# Patient Record
Sex: Female | Born: 1990 | Race: White | Hispanic: No | Marital: Single | Smoking: Never smoker
Health system: Southern US, Community
[De-identification: ages and names within clinical notes are randomized; demographics above are authoritative.]

## PROBLEM LIST (undated history)

## (undated) DIAGNOSIS — K859 Acute pancreatitis without necrosis or infection, unspecified: Secondary | ICD-10-CM

## (undated) DIAGNOSIS — N946 Dysmenorrhea, unspecified: Secondary | ICD-10-CM

## (undated) DIAGNOSIS — F419 Anxiety disorder, unspecified: Secondary | ICD-10-CM

## (undated) DIAGNOSIS — Z3040 Encounter for surveillance of contraceptives, unspecified: Secondary | ICD-10-CM

## (undated) DIAGNOSIS — O24419 Gestational diabetes mellitus in pregnancy, unspecified control: Secondary | ICD-10-CM

## (undated) HISTORY — PX: ADENOIDECTOMY: SUR15

## (undated) HISTORY — PX: WISDOM TOOTH EXTRACTION: SHX21

## (undated) HISTORY — PX: CHOLECYSTECTOMY: SHX55

## (undated) HISTORY — PX: TONSILLECTOMY: SUR1361

---
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
## 2011-01-29 LAB — POCT HEMOGLOBIN-HEMACUE: Hemoglobin: 14.7

## 2011-06-27 LAB — HEPATIC FUNCTION PANEL
ALK PHOS: 39 U/L (ref 25–125)
ALT: 27 U/L (ref 7–35)
AST: 22 U/L (ref 13–35)
Bilirubin, Total: 0.3 mg/dL

## 2011-06-27 LAB — CBC AND DIFFERENTIAL
HCT: 41 % (ref 36–46)
HEMOGLOBIN: 14.1 g/dL (ref 12.0–16.0)
Platelets: 309 10*3/uL (ref 150–399)
WBC: 7.2 10^3/mL

## 2011-06-27 LAB — TSH: TSH: 3.18 u[IU]/mL (ref 0.41–5.90)

## 2011-06-27 LAB — BASIC METABOLIC PANEL
BUN: 12 mg/dL (ref 4–21)
Creatinine: 0.8 mg/dL (ref 0.5–1.1)
GLUCOSE: 120 mg/dL
POTASSIUM: 3.8 mmol/L (ref 3.4–5.3)
SODIUM: 141 mmol/L (ref 137–147)

## 2011-06-27 LAB — HEMOGLOBIN A1C: Hemoglobin A1C: 5.4

## 2011-08-26 ENCOUNTER — Encounter (HOSPITAL_BASED_OUTPATIENT_CLINIC_OR_DEPARTMENT_OTHER): Payer: Self-pay | Admitting: Emergency Medicine

## 2011-08-26 ENCOUNTER — Emergency Department (HOSPITAL_BASED_OUTPATIENT_CLINIC_OR_DEPARTMENT_OTHER)
Admission: EM | Admit: 2011-08-26 | Discharge: 2011-08-26 | Disposition: A | Discharge status: Home / Self Care | Payer: Managed Care, Other (non HMO) | Attending: Emergency Medicine | Admitting: Emergency Medicine

## 2011-08-26 DIAGNOSIS — IMO0002 Reserved for concepts with insufficient information to code with codable children: Secondary | ICD-10-CM | POA: Insufficient documentation

## 2011-08-26 DIAGNOSIS — S0181XA Laceration without foreign body of other part of head, initial encounter: Secondary | ICD-10-CM

## 2011-08-26 DIAGNOSIS — S0180XA Unspecified open wound of other part of head, initial encounter: Secondary | ICD-10-CM | POA: Insufficient documentation

## 2011-08-26 DIAGNOSIS — R51 Headache: Secondary | ICD-10-CM | POA: Insufficient documentation

## 2011-08-26 DIAGNOSIS — Z79899 Other long term (current) drug therapy: Secondary | ICD-10-CM | POA: Insufficient documentation

## 2011-08-26 MED ORDER — LIDOCAINE-EPINEPHRINE-TETRACAINE (LET) SOLUTION
3.0000 mL | Freq: Once | NASAL | Status: AC
  Administered 2011-08-26: 3 mL via TOPICAL
  Filled 2011-08-26: qty 3

## 2011-08-26 NOTE — ED Notes (Signed)
Pt c/o lac to forehead s/p tripping & hitting head on corner of counter; denies LOC

## 2011-08-26 NOTE — ED Provider Notes (Signed)
History     CSN: [id number]  Arrival date & time 08/26/11  1819   First MD Initiated Contact with Patient 08/26/11 1933      Chief Complaint  Patient presents with  . Head Injury  . Head Laceration    (Consider location/radiation/quality/duration/timing/severity/associated sxs/prior treatment) Patient is a 21 y.o. female presenting with head injury and scalp laceration. The history is provided by the patient. No language interpreter was used.  Head Injury  The incident occurred less than 1 hour ago. She came to the ER via walk-in. The injury mechanism was a direct blow. There was no loss of consciousness. The volume of blood lost was minimal. The pain is at a severity of 3/10. The pain is mild. The pain has been constant since the injury. She has tried nothing for the symptoms. The treatment provided no relief.  Head Laceration    History reviewed. No pertinent past medical history.  Past Surgical History  Procedure Date  . Knee surgery   . Tonsillectomy   . Adenoidectomy     No family history on file.  History  Substance Use Topics  . Smoking status: Never Smoker   . Smokeless tobacco: Not on file  . Alcohol Use: No    OB History    Grav Para Term Preterm Abortions TAB SAB Ect Mult Living                  Review of Systems  Skin: Positive for wound.  All other systems reviewed and are negative.    Allergies  Review of patient's allergies indicates no known allergies.  Home Medications   Current Outpatient Rx  Name Route Sig Dispense Refill  . CITALOPRAM HYDROBROMIDE 20 MG PO TABS Oral Take 20 mg by mouth daily.    Marland Kitchen VITAMIN B 12 PO Oral Take 1 tablet by mouth daily.    Marland Kitchen FERROUS SULFATE 325 (65 FE) MG PO TABS Oral Take 325 mg by mouth daily.    . IBUPROFEN 200 MG PO TABS Oral Take by mouth every 6 (six) hours as needed. For pain    . NORETHIN ACE-ETH ESTRAD-FE 1-20 MG-MCG PO TABS Oral Take 1 tablet by mouth daily.      BP 136/80  Pulse 83   Temp(Src) 98.1 F (36.7 C) (Oral)  Resp 16  Ht 5\' 1"  (1.549 m)  Wt 160 lb (72.576 kg)  BMI 30.23 kg/m2  SpO2 100%  LMP 07/29/2011  Physical Exam  Constitutional: She is oriented to person, place, and time. She appears well-developed and well-nourished.  HENT:  Head: Normocephalic and atraumatic.  Right Ear: External ear normal.  Left Ear: External ear normal.  Nose: Nose normal.  Mouth/Throat: Oropharynx is clear and moist.  Eyes: Conjunctivae and EOM are normal. Pupils are equal, round, and reactive to light.  Neck: Normal range of motion. Neck supple.  Cardiovascular: Normal rate.   Pulmonary/Chest: Effort normal.  Abdominal: Soft.  Musculoskeletal: Normal range of motion.  Neurological: She is alert and oriented to person, place, and time.  Skin:       1.6 cm laceration forehead  Psychiatric: She has a normal mood and affect.    ED Course  LACERATION REPAIR Date/Time: 08/26/2011 8:44 PM Performed by: Elson Areas Authorized by: Elson Areas Consent: Verbal consent not obtained. Consent given by: patient Patient understanding: patient states understanding of the procedure being performed Patient identity confirmed: verbally with patient Time out: Immediately prior to procedure a "time out"  was called to verify the correct patient, procedure, equipment, support staff and site/side marked as required. Body area: head/neck Laceration length: 1.8 cm Foreign bodies: no foreign bodies Vascular damage: no Anesthesia: local infiltration Irrigation solution: saline Amount of cleaning: standard Skin closure: 6-0 Prolene Number of sutures: 4 Technique: simple Approximation difficulty: simple Patient tolerance: Patient tolerated the procedure well with no immediate complications.   (including critical care time)  Labs Reviewed - No data to display No results found.   No diagnosis found.    MDM  Suture removal in 6 days       Lonia Skinner [place],  [place] 08/26/11 2046

## 2011-08-26 NOTE — Discharge Instructions (Signed)
Facial Laceration  A facial laceration is a cut on the face. Lacerations usually heal quickly, but they need special care to reduce scarring. It will take 1 to 2 years for the scar to lose its redness and to heal completely.  TREATMENT   Some facial lacerations may not require closure. Some lacerations may not be able to be closed due to an increased risk of infection. It is important to see your caregiver as soon as possible after an injury to minimize the risk of infection and to maximize the opportunity for successful closure.  If closure is appropriate, pain medicines may be given, if needed. The wound will be cleaned to help prevent infection. Your caregiver will use stitches (sutures), staples, wound glue (adhesive), or skin adhesive strips to repair the laceration. These tools bring the skin edges together to allow for faster healing and a better cosmetic outcome. However, all wounds will heal with a scar.   Once the wound has healed, scarring can be minimized by covering the wound with sunscreen during the day for 1 full year. Use a sunscreen with an SPF of at least 30. Sunscreen helps to reduce the pigment that will form in the scar. When applying sunscreen to a completely healed wound, massage the scar for a few minutes to help reduce the appearance of the scar. Use circular motions with your fingertips, on and around the scar. Do not massage a healing wound.  HOME CARE INSTRUCTIONS  For sutures:   Keep the wound clean and dry.   If you were given a bandage (dressing), you should change it at least once a day. Also change the dressing if it becomes wet or dirty, or as directed by your caregiver.   Wash the wound with soap and water 2 times a day. Rinse the wound off with water to remove all soap. Pat the wound dry with a clean towel.   After cleaning, apply a thin layer of the antibiotic ointment recommended by your caregiver. This will help prevent infection and keep the dressing from sticking.   You  may shower as usual after the first 24 hours. Do not soak the wound in water until the sutures are removed.   Only take over-the-counter or prescription medicines for pain, discomfort, or fever as directed by your caregiver.   Get your sutures removed as directed by your caregiver. With facial lacerations, sutures should usually be taken out after 4 to 5 days to avoid stitch marks.   Wait a few days after your sutures are removed before applying makeup.  For skin adhesive strips:   Keep the wound clean and dry.   Do not get the skin adhesive strips wet. You may bathe carefully, using caution to keep the wound dry.   If the wound gets wet, pat it dry with a clean towel.   Skin adhesive strips will fall off on their own. You may trim the strips as the wound heals. Do not remove skin adhesive strips that are still stuck to the wound. They will fall off in time.  For wound adhesive:   You may briefly wet your wound in the shower or bath. Do not soak or scrub the wound. Do not swim. Avoid periods of heavy perspiration until the skin adhesive has fallen off on its own. After showering or bathing, gently pat the wound dry with a clean towel.   Do not apply liquid medicine, cream medicine, ointment medicine, or makeup to your wound while the   skin adhesive is in place. This may loosen the film before your wound is healed.   If a dressing is placed over the wound, be careful not to apply tape directly over the skin adhesive. This may cause the adhesive to be pulled off before the wound is healed.   Avoid prolonged exposure to sunlight or tanning lamps while the skin adhesive is in place. Exposure to ultraviolet light in the first year will darken the scar.   The skin adhesive will usually remain in place for 5 to 10 days, then naturally fall off the skin. Do not pick at the adhesive film.  You may need a tetanus shot if:   You cannot remember when you had your last tetanus shot.   You have never had a tetanus  shot.  If you get a tetanus shot, your arm may swell, get red, and feel warm to the touch. This is common and not a problem. If you need a tetanus shot and you choose not to have one, there is a rare chance of getting tetanus. Sickness from tetanus can be serious.  SEEK IMMEDIATE MEDICAL CARE IF:   You develop redness, pain, or swelling around the wound.   There is yellowish-white fluid (pus) coming from the wound.   You develop chills or a fever.  MAKE SURE YOU:   Understand these instructions.   Will watch your condition.   Will get help right away if you are not doing well or get worse.  Document Released: 05/22/2004 Document Revised: 04/03/2011 Document Reviewed: 10/07/2010  ExitCare Patient Information 2012 [employer].

## 2011-08-27 NOTE — ED Provider Notes (Signed)
Medical screening examination/treatment/procedure(s) were conducted as a shared visit with non-physician practitioner(s) and myself.  I personally evaluated the patient during the encounter  Pt seen by me initially.  PAC Sofia performed wound repair under my supervision.  No LOC, no bleeding, laceration to mid forehead in a U shape, simple.  Please see procedure note for full details.    Gavin Pound. Mersedes Alber, MD 08/27/11 0002

## 2012-01-09 LAB — CBC AND DIFFERENTIAL
HCT: 41 % (ref 36–46)
HEMOGLOBIN: 13.7 g/dL (ref 12.0–16.0)
Platelets: 299 10*3/uL (ref 150–399)
WBC: 7.2 10*3/mL

## 2012-03-04 LAB — LIPID PANEL
Cholesterol: 136 mg/dL (ref 0–200)
HDL: 63 mg/dL (ref 35–70)
LDL Cholesterol: 64 mg/dL
Triglycerides: 43 mg/dL (ref 40–160)

## 2012-03-04 LAB — HEPATIC FUNCTION PANEL
ALT: 19 U/L (ref 7–35)
AST: 16 U/L (ref 13–35)
Alkaline Phosphatase: 43 U/L (ref 25–125)
Bilirubin, Total: 0.2 mg/dL

## 2012-03-04 LAB — BASIC METABOLIC PANEL
BUN: 14 mg/dL (ref 4–21)
Creatinine: 0.8 mg/dL (ref 0.5–1.1)
GLUCOSE: 84 mg/dL
POTASSIUM: 4.2 mmol/L (ref 3.4–5.3)
Sodium: 139 mmol/L (ref 137–147)

## 2013-07-12 LAB — TSH: TSH: 2.87 u[IU]/mL (ref 0.41–5.90)

## 2013-07-12 LAB — BASIC METABOLIC PANEL
BUN: 16 mg/dL (ref 4–21)
CREATININE: 1 mg/dL (ref 0.5–1.1)
Glucose: 80 mg/dL
Potassium: 4 mmol/L (ref 3.4–5.3)
SODIUM: 139 mmol/L (ref 137–147)

## 2013-07-12 LAB — HEPATIC FUNCTION PANEL
ALK PHOS: 43 U/L (ref 25–125)
ALT: 14 U/L (ref 7–35)
AST: 17 U/L (ref 13–35)
BILIRUBIN, TOTAL: 0.6 mg/dL

## 2013-07-12 LAB — LIPID PANEL
CHOLESTEROL: 102 mg/dL (ref 0–200)
HDL: 58 mg/dL (ref 35–70)
LDL CALC: 38 mg/dL
TRIGLYCERIDES: 29 mg/dL — AB (ref 40–160)

## 2013-07-12 LAB — CBC AND DIFFERENTIAL
HEMATOCRIT: 41 % (ref 36–46)
Hemoglobin: 13.7 g/dL (ref 12.0–16.0)
Platelets: 283 10*3/uL (ref 150–399)
WBC: 7.4 10^3/mL

## 2013-12-22 LAB — BASIC METABOLIC PANEL
BUN: 14 mg/dL (ref 4–21)
CREATININE: 0.8 mg/dL (ref 0.5–1.1)
Glucose: 75 mg/dL
POTASSIUM: 3.7 mmol/L (ref 3.4–5.3)
Sodium: 140 mmol/L (ref 137–147)

## 2013-12-22 LAB — HEPATIC FUNCTION PANEL
ALT: 74 U/L — AB (ref 7–35)
AST: 35 U/L (ref 13–35)
Alkaline Phosphatase: 52 U/L (ref 25–125)
Bilirubin, Total: 0.4 mg/dL

## 2013-12-22 LAB — HEMOGLOBIN A1C: HEMOGLOBIN A1C: 5.2

## 2014-01-20 LAB — HEPATIC FUNCTION PANEL
ALT: 19 U/L (ref 7–35)
AST: 16 U/L (ref 13–35)
Alkaline Phosphatase: 45 U/L (ref 25–125)
Bilirubin, Total: 0.5 mg/dL

## 2014-01-20 LAB — BASIC METABOLIC PANEL
BUN: 12 mg/dL (ref 4–21)
CREATININE: 0.9 mg/dL (ref 0.5–1.1)
GLUCOSE: 88 mg/dL
POTASSIUM: 4.4 mmol/L (ref 3.4–5.3)
SODIUM: 139 mmol/L (ref 137–147)

## 2014-08-06 ENCOUNTER — Inpatient Hospital Stay (HOSPITAL_COMMUNITY)
Admission: AD | Admit: 2014-08-06 | Discharge: 2014-08-24 | DRG: 438 | Disposition: A | Discharge status: Home / Self Care | Payer: Managed Care, Other (non HMO) | Source: Other Acute Inpatient Hospital | Attending: Internal Medicine | Admitting: Internal Medicine

## 2014-08-06 DIAGNOSIS — K5909 Other constipation: Secondary | ICD-10-CM | POA: Diagnosis not present

## 2014-08-06 DIAGNOSIS — K8020 Calculus of gallbladder without cholecystitis without obstruction: Secondary | ICD-10-CM | POA: Diagnosis not present

## 2014-08-06 DIAGNOSIS — J811 Chronic pulmonary edema: Secondary | ICD-10-CM

## 2014-08-06 DIAGNOSIS — K838 Other specified diseases of biliary tract: Secondary | ICD-10-CM | POA: Diagnosis not present

## 2014-08-06 DIAGNOSIS — Z6835 Body mass index (BMI) 35.0-35.9, adult: Secondary | ICD-10-CM

## 2014-08-06 DIAGNOSIS — K858 Other acute pancreatitis without necrosis or infection: Secondary | ICD-10-CM

## 2014-08-06 DIAGNOSIS — F419 Anxiety disorder, unspecified: Secondary | ICD-10-CM | POA: Diagnosis present

## 2014-08-06 DIAGNOSIS — R6510 Systemic inflammatory response syndrome (SIRS) of non-infectious origin without acute organ dysfunction: Secondary | ICD-10-CM | POA: Diagnosis present

## 2014-08-06 DIAGNOSIS — R109 Unspecified abdominal pain: Secondary | ICD-10-CM | POA: Diagnosis present

## 2014-08-06 DIAGNOSIS — K550 Acute vascular disorders of intestine: Secondary | ICD-10-CM | POA: Diagnosis not present

## 2014-08-06 DIAGNOSIS — E669 Obesity, unspecified: Secondary | ICD-10-CM | POA: Diagnosis present

## 2014-08-06 DIAGNOSIS — E876 Hypokalemia: Secondary | ICD-10-CM | POA: Diagnosis not present

## 2014-08-06 DIAGNOSIS — R1013 Epigastric pain: Secondary | ICD-10-CM

## 2014-08-06 DIAGNOSIS — N946 Dysmenorrhea, unspecified: Secondary | ICD-10-CM | POA: Diagnosis present

## 2014-08-06 DIAGNOSIS — D72829 Elevated white blood cell count, unspecified: Secondary | ICD-10-CM | POA: Diagnosis present

## 2014-08-06 DIAGNOSIS — I8290 Acute embolism and thrombosis of unspecified vein: Secondary | ICD-10-CM | POA: Diagnosis not present

## 2014-08-06 DIAGNOSIS — A419 Sepsis, unspecified organism: Secondary | ICD-10-CM | POA: Diagnosis not present

## 2014-08-06 DIAGNOSIS — K5900 Constipation, unspecified: Secondary | ICD-10-CM

## 2014-08-06 DIAGNOSIS — K851 Biliary acute pancreatitis without necrosis or infection: Secondary | ICD-10-CM | POA: Insufficient documentation

## 2014-08-06 DIAGNOSIS — K859 Acute pancreatitis without necrosis or infection, unspecified: Secondary | ICD-10-CM

## 2014-08-06 DIAGNOSIS — E43 Unspecified severe protein-calorie malnutrition: Secondary | ICD-10-CM | POA: Diagnosis present

## 2014-08-06 DIAGNOSIS — I471 Supraventricular tachycardia: Secondary | ICD-10-CM | POA: Diagnosis not present

## 2014-08-06 DIAGNOSIS — K8591 Acute pancreatitis with uninfected necrosis, unspecified: Secondary | ICD-10-CM

## 2014-08-06 HISTORY — DX: Encounter for surveillance of contraceptives, unspecified: Z30.40

## 2014-08-06 HISTORY — DX: Dysmenorrhea, unspecified: N94.6

## 2014-08-06 HISTORY — DX: Anxiety disorder, unspecified: F41.9

## 2014-08-06 LAB — CBC WITH DIFFERENTIAL/PLATELET
BASOS ABS: 0 10*3/uL (ref 0.0–0.1)
BASOS PCT: 0 % (ref 0–1)
EOS ABS: 0 10*3/uL (ref 0.0–0.7)
Eosinophils Relative: 0 % (ref 0–5)
HCT: 47.9 % — ABNORMAL HIGH (ref 36.0–46.0)
Hemoglobin: 16.4 g/dL — ABNORMAL HIGH (ref 12.0–15.0)
LYMPHS PCT: 4 % — AB (ref 12–46)
Lymphs Abs: 0.8 10*3/uL (ref 0.7–4.0)
MCH: 30.8 pg (ref 26.0–34.0)
MCHC: 34.2 g/dL (ref 30.0–36.0)
MCV: 90 fL (ref 78.0–100.0)
MONO ABS: 1 10*3/uL (ref 0.1–1.0)
Monocytes Relative: 4 % (ref 3–12)
Neutro Abs: 20.1 10*3/uL — ABNORMAL HIGH (ref 1.7–7.7)
Neutrophils Relative %: 92 % — ABNORMAL HIGH (ref 43–77)
PLATELETS: 284 10*3/uL (ref 150–400)
RBC: 5.32 MIL/uL — AB (ref 3.87–5.11)
RDW: 12.5 % (ref 11.5–15.5)
WBC: 21.8 10*3/uL — ABNORMAL HIGH (ref 4.0–10.5)

## 2014-08-06 LAB — COMPREHENSIVE METABOLIC PANEL
ALBUMIN: 3.4 g/dL — AB (ref 3.5–5.2)
ALT: 55 U/L — ABNORMAL HIGH (ref 0–35)
AST: 45 U/L — AB (ref 0–37)
Alkaline Phosphatase: 60 U/L (ref 39–117)
Anion gap: 11 (ref 5–15)
BUN: 6 mg/dL (ref 6–23)
CALCIUM: 8 mg/dL — AB (ref 8.4–10.5)
CHLORIDE: 107 mmol/L (ref 96–112)
CO2: 20 mmol/L (ref 19–32)
Creatinine, Ser: 0.86 mg/dL (ref 0.50–1.10)
GFR calc Af Amer: >90 mL/min (ref >90)
GFR calc non Af Amer: >90 mL/min (ref >90)
GLUCOSE: 155 mg/dL — AB (ref 70–99)
Potassium: 4.7 mmol/L (ref 3.5–5.1)
Sodium: 138 mmol/L (ref 135–145)
TOTAL PROTEIN: 7.1 g/dL (ref 6.0–8.3)
Total Bilirubin: 0.9 mg/dL (ref 0.3–1.2)

## 2014-08-06 LAB — PROTIME-INR
INR: 0.99 (ref 0.00–1.49)
Prothrombin Time: 13.2 seconds (ref 11.6–15.2)

## 2014-08-06 LAB — PHOSPHORUS: Phosphorus: 3.5 mg/dL (ref 2.3–4.6)

## 2014-08-06 LAB — LACTIC ACID, PLASMA: Lactic Acid, Venous: 2 mmol/L (ref 0.5–2.0)

## 2014-08-06 LAB — LIPASE, BLOOD: LIPASE: 400 U/L — AB (ref 11–59)

## 2014-08-06 LAB — MAGNESIUM: Magnesium: 1.6 mg/dL (ref 1.5–2.5)

## 2014-08-06 LAB — APTT: APTT: 22 s — AB (ref 24–37)

## 2014-08-06 MED ORDER — ENOXAPARIN SODIUM 40 MG/0.4ML ~~LOC~~ SOLN
40.0000 mg | SUBCUTANEOUS | Status: DC
  Administered 2014-08-06 – 2014-08-13 (×8): 40 mg via SUBCUTANEOUS
  Filled 2014-08-06 (×9): qty 0.4

## 2014-08-06 MED ORDER — PIPERACILLIN-TAZOBACTAM 3.375 G IVPB
3.3750 g | Freq: Three times a day (TID) | INTRAVENOUS | Status: DC
  Administered 2014-08-07 – 2014-08-15 (×25): 3.375 g via INTRAVENOUS
  Filled 2014-08-06 (×28): qty 50

## 2014-08-06 MED ORDER — PROMETHAZINE HCL 25 MG PO TABS: 12.5000 mg | ORAL_TABLET | Freq: Four times a day (QID) | ORAL | Status: DC | PRN

## 2014-08-06 MED ORDER — MORPHINE SULFATE 4 MG/ML IJ SOLN
4.0000 mg | Freq: Once | INTRAMUSCULAR | Status: AC
  Administered 2014-08-06: 4 mg via INTRAVENOUS
  Filled 2014-08-06: qty 1

## 2014-08-06 MED ORDER — PROMETHAZINE HCL 25 MG/ML IJ SOLN
25.0000 mg | Freq: Once | INTRAMUSCULAR | Status: AC
  Administered 2014-08-06: 25 mg via INTRAVENOUS
  Filled 2014-08-06: qty 1

## 2014-08-06 MED ORDER — SODIUM CHLORIDE 0.9 % IV SOLN
INTRAVENOUS | Status: AC
  Administered 2014-08-06 – 2014-08-10 (×11): via INTRAVENOUS

## 2014-08-06 MED ORDER — CETYLPYRIDINIUM CHLORIDE 0.05 % MT LIQD
7.0000 mL | Freq: Two times a day (BID) | OROMUCOSAL | Status: DC
  Administered 2014-08-07 – 2014-08-17 (×9): 7 mL via OROMUCOSAL

## 2014-08-06 MED ORDER — HYDROMORPHONE HCL 4 MG/ML IJ SOLN
4.0000 mg | INTRAMUSCULAR | Status: DC | PRN
  Administered 2014-08-06 – 2014-08-09 (×13): 4 mg via INTRAVENOUS
  Filled 2014-08-06 (×13): qty 1

## 2014-08-06 MED ORDER — SODIUM CHLORIDE 0.9 % IJ SOLN
3.0000 mL | Freq: Two times a day (BID) | INTRAMUSCULAR | Status: DC
  Administered 2014-08-06 – 2014-08-16 (×13): 3 mL via INTRAVENOUS

## 2014-08-06 MED ORDER — PIPERACILLIN-TAZOBACTAM 3.375 G IVPB
3.3750 g | Freq: Once | INTRAVENOUS | Status: AC
Start: 2014-08-06 — End: 2014-08-06
  Administered 2014-08-06: 3.375 g via INTRAVENOUS
  Filled 2014-08-06: qty 50

## 2014-08-06 MED ORDER — CHLORHEXIDINE GLUCONATE 0.12 % MT SOLN
15.0000 mL | Freq: Two times a day (BID) | OROMUCOSAL | Status: DC
  Administered 2014-08-06 – 2014-08-19 (×22): 15 mL via OROMUCOSAL
  Filled 2014-08-06 (×29): qty 15

## 2014-08-06 NOTE — Progress Notes (Signed)
Pt admitted to the unit at 1800. Pt mental status is A&OX4. Pt oriented to room, staff, and call bell. Skin is intact. Full assessment charted in [facility]. Call bell within reach. Visitor guidelines reviewed w/ pt and/or family.

## 2014-08-06 NOTE — H&P (Signed)
PCP:   No PCP Per Patient   Chief Complaint:  abd pain  HPI: 24 yo healthy female with 6 months of on/off epigastric pain is scheduled to see [facility] doctor on Tuesday transferred here from [facility] ER with severe epigastric abdominal pain started this am and has persisted, with associated nonbloody n/v.  No diarrhea.  No fevers.  Still has gallbladder.  W/u there shows lipase over 37,000 , mildly elevated lfts nml alk phos and tbili with ct showing stone in gallbladder, GB wall thickening, edema around pancreas, cbd dilation with no stone.  She is receiving her 3rd liter of ivf now, has received zofran, dilaudid 58m iv and fentanyl 586m and still in significant pain.  upt neg.  General surgery aware of consult.    Review of Systems:  Positive and negative as per HPI otherwise all other systems are negative  Past Medical History: No past medical history on file. Past Surgical History  Procedure Laterality Date  . Knee surgery    . Tonsillectomy    . Adenoidectomy      Medications: Prior to Admission medications   Medication Sig Start Date End Date Taking? Authorizing Provider  rizatriptan (MAXALT) 10 MG tablet Take 10 mg by mouth. 03/29/14 03/29/15 Yes Historical Provider, MD  Biotin 1 MG CAPS Take by mouth.    Historical Provider, MD  citalopram (CELEXA) 20 MG tablet Take 20 mg by mouth daily.    Historical Provider, MD  Cyanocobalamin (VITAMIN B 12 PO) Take 1 tablet by mouth daily.    Historical Provider, MD  ferrous sulfate 325 (65 FE) MG tablet Take 325 mg by mouth daily.    Historical Provider, MD  ibuprofen (ADVIL,MOTRIN) 200 MG tablet Take by mouth every 6 (six) hours as needed. For pain    Historical Provider, MD  norethindrone-ethinyl estradiol (JUNEL FE,GILDESS FE,LOESTRIN FE) 1-20 MG-MCG tablet Take 1 tablet by mouth daily.    Historical Provider, MD    Allergies:  No Known Allergies  Social History:  reports that she has never smoked. She does not have any smokeless  tobacco history on file. She reports that she does not drink alcohol or use illicit drugs.  Family History: negative  Physical Exam: Filed Vitals:   08/06/14 1800  BP: 142/95  Pulse: 98  Temp: 99 F (37.2 C)  TempSrc: Oral  Resp: 18  Height: 5' 1"  (1.549 m)  Weight: 85.4 kg (188 lb 4.4 oz)  SpO2: 98%   General appearance: alert, cooperative and moderate distress Head: Normocephalic, without obvious abnormality, atraumatic Eyes: negative Nose: Nares normal. Septum midline. Mucosa normal. No drainage or sinus tenderness. Neck: no JVD and supple, symmetrical, trachea midline Lungs: clear to auscultation bilaterally Heart: regular rate and rhythm, S1, S2 normal, no murmur, click, rub or gallop Abdomen: soft nd ttp diffuse dec bs, no rebound some guarding  Extremities: extremities normal, atraumatic, no cyanosis or edema Pulses: 2+ and symmetric Skin: Skin color, texture, turgor normal. No rashes or lesions Neurologic: Grossly normal    Labs on Admission:  Lip over 37000.  Trop neg.  lfts mildly elevated with nml tbili and alk phos.  Wbc 21000.  H/h nml.  Ct abd/pelvis as above.  AAS nml.  upt negative.  ua no infection.  Radiological Exams on Admission: No results found.  Assessment/Plan  [age]o healthy female with acute pancreatitis due to cholelithiasis/cholecystitis  Principal Problem:   Pancreatitis, acute- due to gallbladder disease.  Npo.  Aggressive ivf resusciation.  Repeat cmp and lipase level now along with lactic acid level.  Pain poorly controlled and received hefty dose of dilaudid already at [facility], will try morphine 78m iv once now and see if that helps any better.  May need to proceed to pca pump tonight if pain not improved- RN aware to call [facility] if need to do so.  Will make general surgery aware of pt arrival (dr gentry called by rOval Linseyalready and requested medicine admission).  Cont zosyn due to severity of illness.  Will ultimately need cholecystectomy  at some point +/- ERCP possibly.  Await general surgery recommendations.    Active Problems:  All related to above, plan as above   Abdominal pain   Gallstones   Common bile duct dilatation   Leukocytosis  Admit to telemetry.  Full code.    [name] Blanchard 08/06/2014, 6:37 PM

## 2014-08-06 NOTE — Progress Notes (Signed)
ANTIBIOTIC CONSULT NOTE - INITIAL  Pharmacy Consult for Zosyn Indication: Intra-abdominal infection  No Known Allergies  Patient Measurements: Height: 5\' 1"  (154.9 cm) Weight: 188 lb 4.4 oz (85.4 kg) IBW/kg (Calculated) : 47.8  Vital Signs: Temp: 99 F (37.2 C) (04/10 1800) Temp Source: Oral (04/10 1800) BP: 142/95 mmHg (04/10 1800) Pulse Rate: 98 (04/10 1800) Intake/Output from previous day:   Intake/Output from this shift:    Labs: No results for input(s): WBC, HGB, PLT, LABCREA, CREATININE in the last 72 hours. CrCl cannot be calculated (Patient has no serum creatinine result on file.). No results for input(s): VANCOTROUGH, VANCOPEAK, VANCORANDOM, GENTTROUGH, GENTPEAK, GENTRANDOM, TOBRATROUGH, TOBRAPEAK, TOBRARND, AMIKACINPEAK, AMIKACINTROU, AMIKACIN in the last 72 hours.   Microbiology: No results found for this or any previous visit (from the past 720 hour(s)).  Medical History: No past medical history on file.  Assessment: [age] YOF who presented with acute pancreatitis. Pharmacy consulted to start Zosyn for empiric abdominal coverage. CMP pending - will f/u later to see if Zosyn needs to be adjusted based on renal function.   Goal of Therapy:  Proper antibiotics for infection/cultures adjusted for renal/hepatic function   Plan:  1. Zosyn 3.375g IV every 8 hours 2. Will continue to follow renal function, culture results, LOT, and antibiotic de-escalation plans   Georgina PillionElizabeth Jalexus Brett, PharmD, BCPS Clinical Pharmacist Pager: [phone] 08/06/2014 7:07 PM

## 2014-08-07 ENCOUNTER — Encounter (HOSPITAL_COMMUNITY): Payer: Self-pay | Admitting: General Surgery

## 2014-08-07 DIAGNOSIS — A419 Sepsis, unspecified organism: Secondary | ICD-10-CM

## 2014-08-07 DIAGNOSIS — K859 Acute pancreatitis without necrosis or infection, unspecified: Secondary | ICD-10-CM | POA: Diagnosis present

## 2014-08-07 LAB — CBC
HCT: 52.1 % — ABNORMAL HIGH (ref 36.0–46.0)
Hemoglobin: 17.4 g/dL — ABNORMAL HIGH (ref 12.0–15.0)
MCH: 30.9 pg (ref 26.0–34.0)
MCHC: 33.4 g/dL (ref 30.0–36.0)
MCV: 92.4 fL (ref 78.0–100.0)
PLATELETS: 368 10*3/uL (ref 150–400)
RBC: 5.64 MIL/uL — ABNORMAL HIGH (ref 3.87–5.11)
RDW: 12.9 % (ref 11.5–15.5)
WBC: 30.9 10*3/uL — AB (ref 4.0–10.5)

## 2014-08-07 LAB — LACTIC ACID, PLASMA: Lactic Acid, Venous: 2.3 mmol/L (ref 0.5–2.0)

## 2014-08-07 LAB — COMPREHENSIVE METABOLIC PANEL
ALK PHOS: 52 U/L (ref 39–117)
ALT: 42 U/L — ABNORMAL HIGH (ref 0–35)
AST: 37 U/L (ref 0–37)
Albumin: 3.1 g/dL — ABNORMAL LOW (ref 3.5–5.2)
Anion gap: 14 (ref 5–15)
BILIRUBIN TOTAL: 0.8 mg/dL (ref 0.3–1.2)
BUN: 9 mg/dL (ref 6–23)
CHLORIDE: 106 mmol/L (ref 96–112)
CO2: 19 mmol/L (ref 19–32)
Calcium: 7.9 mg/dL — ABNORMAL LOW (ref 8.4–10.5)
Creatinine, Ser: 0.92 mg/dL (ref 0.50–1.10)
GFR calc Af Amer: >90 mL/min (ref >90)
GFR calc non Af Amer: 88 mL/min — ABNORMAL LOW (ref >90)
Glucose, Bld: 121 mg/dL — ABNORMAL HIGH (ref 70–99)
POTASSIUM: 4.5 mmol/L (ref 3.5–5.1)
SODIUM: 139 mmol/L (ref 135–145)
Total Protein: 6.9 g/dL (ref 6.0–8.3)

## 2014-08-07 LAB — LIPASE, BLOOD: Lipase: 795 U/L — ABNORMAL HIGH (ref 11–59)

## 2014-08-07 MED ORDER — PROMETHAZINE HCL 25 MG/ML IJ SOLN
12.5000 mg | Freq: Four times a day (QID) | INTRAMUSCULAR | Status: DC | PRN
  Administered 2014-08-07 – 2014-08-09 (×5): 12.5 mg via INTRAVENOUS
  Filled 2014-08-07 (×5): qty 1

## 2014-08-07 MED ORDER — ONDANSETRON HCL 4 MG/2ML IJ SOLN
4.0000 mg | Freq: Four times a day (QID) | INTRAMUSCULAR | Status: DC | PRN
  Administered 2014-08-07: 4 mg via INTRAVENOUS
  Filled 2014-08-07: qty 2

## 2014-08-07 MED ORDER — ONDANSETRON HCL 4 MG/2ML IJ SOLN
4.0000 mg | Freq: Once | INTRAMUSCULAR | Status: AC
  Administered 2014-08-07: 4 mg via INTRAVENOUS
  Filled 2014-08-07: qty 2

## 2014-08-07 MED ORDER — ONDANSETRON HCL 4 MG/2ML IJ SOLN
4.0000 mg | INTRAMUSCULAR | Status: DC
  Administered 2014-08-07 – 2014-08-09 (×10): 4 mg via INTRAVENOUS
  Filled 2014-08-07 (×10): qty 2

## 2014-08-07 MED ORDER — SENNOSIDES-DOCUSATE SODIUM 8.6-50 MG PO TABS
1.0000 | ORAL_TABLET | Freq: Two times a day (BID) | ORAL | Status: DC
  Filled 2014-08-07 (×3): qty 1

## 2014-08-07 NOTE — Progress Notes (Signed)
CRITICAL VALUE ALERT  Critical value received:  Lactic acid 2.3  Date of notification:  08/07/14  Time of notification:  0030  Critical value read back:Yes.    Nurse who received alert:  Lendell CapriceSabita Koraline Phillipson, RN  MD notified (1st page):  Merdis DelayK Schorr, NP  Time of first page:  [phone]  MD notified (2nd page):   Time of second page: [phone]  Responding MD: Merdis DelayK Schorr, NP  Time MD responded: 0102  No new orders received.

## 2014-08-07 NOTE — Progress Notes (Signed)
[facility] PROGRESS NOTE  [name] Blanchard EPP:[id number] DOB: 1990-05-14 DOA: 08/06/2014 PCP: No PCP Per Patient  Assessment/Plan: 24 y/o female with with chronic intermittent abdominal pains for 6 month is admitted with acute pancreatitis  -Patient is transferred from [facility] ER: lipase over 37,000 , mildly elevated lfts nml alk phos and tbili; CT showing stone in gallbladder, GB wall thickening, edema around pancreas, cbd dilation with no stone.   1. Acute pancreatitis, suspected gallstone pancreatitis; CT abd as above; lipase improved 37000-->400; bili 0.9 -clinically still intermittent vomiting, abd pains;  will cont NPO, IVF, antiemetics, IV atx; check triglyceride, cont pain control; add bowel regimen   2. SIRS/Sepsis presented on admission in the setting of acute pancreatitis -will cont IVF, IV atx; monitor closely     Code Status: full Family Communication: d/w patient, her mother  (indicate person spoken with, relationship, and if by phone, the number) Disposition Plan: home pend clinical improvement    Consultants:  Surgery   Procedures:  none  Antibiotics:  Zosyn 4/10>>>   (indicate start date, and stop date if known)  HPI/Subjective: Alert, oriented   Objective: Filed Vitals:   08/07/14 0531  BP: 127/83  Pulse: 120  Temp: 98.3 F (36.8 C)  Resp: 18    Intake/Output Summary (Last 24 hours) at 08/07/14 0911 Last data filed at 08/07/14 0854  Gross per 24 hour  Intake 1662.5 ml  Output    760 ml  Net  902.5 ml   Filed Weights   08/06/14 1800 08/07/14 0531  Weight: 85.4 kg (188 lb 4.4 oz) 85.3 kg (188 lb 0.8 oz)    Exam:   General:  alert  Cardiovascular: s1,s2 rrr  Respiratory: CTA BL  Abdomen: distended, mild diffuse tender, no rebound  Musculoskeletal: no leg pitting edema    Data Reviewed: Basic Metabolic Panel:  Recent Labs Lab 08/06/14 2007  NA 138  K 4.7  CL 107  CO2 20  GLUCOSE 155*  BUN 6  CREATININE 0.86   CALCIUM 8.0*  MG 1.6  PHOS 3.5   Liver Function Tests:  Recent Labs Lab 08/06/14 2007  AST 45*  ALT 55*  ALKPHOS 60  BILITOT 0.9  PROT 7.1  ALBUMIN 3.4*    Recent Labs Lab 08/06/14 2007  LIPASE 400*   No results for input(s): AMMONIA in the last 168 hours. CBC:  Recent Labs Lab 08/06/14 2007  WBC 21.8*  NEUTROABS 20.1*  HGB 16.4*  HCT 47.9*  MCV 90.0  PLT 284   Cardiac Enzymes: No results for input(s): CKTOTAL, CKMB, CKMBINDEX, TROPONINI in the last 168 hours. BNP (last 3 results) No results for input(s): BNP in the last 8760 hours.  ProBNP (last 3 results) No results for input(s): PROBNP in the last 8760 hours.  CBG: No results for input(s): GLUCAP in the last 168 hours.  No results found for this or any previous visit (from the past 240 hour(s)).   Studies: No results found.  Scheduled Meds: . antiseptic oral rinse  7 mL Mouth Rinse q12n4p  . chlorhexidine  15 mL Mouth Rinse BID  . enoxaparin (LOVENOX) injection  40 mg Subcutaneous Q24H  . piperacillin-tazobactam (ZOSYN)  IV  3.375 g Intravenous Q8H  . sodium chloride  3 mL Intravenous Q12H   Continuous Infusions: . sodium chloride 150 mL/hr at 08/06/14 1956    Principal Problem:   Pancreatitis, acute Active Problems:   Abdominal pain   Gallstones   Common bile duct dilatation   Leukocytosis  Acute pancreatitis    Time spent: >35 minutes     [name]  [facility] Pager [phone]. If 7PM-7AM, please contact night-coverage at [email or web address], password [facility] 08/07/2014, 9:11 AM  LOS: 1 day

## 2014-08-07 NOTE — Consult Note (Signed)
[facility] Consult Note  [name] Blanchard [birth year]  [id number].    Requesting MD: Dr. Daleen Bo Chief Complaint/Reason for Consult: Gallstone pancreatitis  HPI:  24 y/o previously healthy white female presented to [facility] ER on 08/06/2014 with constant, severe epigastric pain beginning that morning with associated bilious, non-bloody N/V. Pt transferred and admitted with acute pancreatitis to [facility] on 08/06/2014 in the evening. She had been experiencing less-severe on/off epigastric pain with associated nausea and vomiting associated with eating for the past 6-7 months. Currently, the pain is non-radiating, constant epigastric pain/RUQ abdominal pain with 2/10 severity at rest, and episodes of 10/10 with movement and vomiting. Associated with constant nausea, frequent bilious vomiting, and sore throat. Some relief of nausea with Phenergan, but none with Zofran. She notes that fatty/greasy foods initially brought on her symptoms, but now anything she eats cause her symptoms.  Pt also experiencing constipation x6 months, last BM was 3-4 days ago and described as dark-greenish. Pt currently on menstrual period, with associated lower abdominal pain. Denies fever/chills, HA, chest pain, SOB, diarrhea, blood in vomit or stool.   On admission, lipase >37,000, WBC 21,800, and LFTs were mildly elevated, with normal total bilirubin and alk phos. Abdominal CT shows a 12m stone in the GB, GB wall thickening with peri-cholecystic fluid, enlarged pancreas with peri-pancreatic edema, and CBD dilation with no stone present.  Empiric abx treatment with Zosyn.  CCS consulted for cholecystectomy. On review of her chart, lipase is trending down to 400 last night, then back up to 795 this am, WBC trending up to 30,900 this am, and lactic acid up to 2.3 from 2.0. Other labs stable.   ROS: All systems reviewed and otherwise negative except for as above.   No family history on file.  No past medical history on  file.  Past Surgical History  Procedure Laterality Date  . Knee surgery    . Tonsillectomy    . Adenoidectomy      Social History:  reports that she has never smoked. She does not have any smokeless tobacco history on file. She reports that she does not drink alcohol or use illicit drugs.  Allergies: No Known Allergies  Medications Prior to Admission  Medication Sig Dispense Refill  . citalopram (CELEXA) 20 MG tablet Take 20 mg by mouth daily.    . Cyanocobalamin (VITAMIN B-12 IJ) Inject 1 application into the skin every Monday, Wednesday, and Friday.    .Marland Kitchenibuprofen (ADVIL,MOTRIN) 200 MG tablet Take 400 mg by mouth every 6 (six) hours as needed (pain).     . phentermine (ADIPEX-P) 37.5 MG tablet Take 37.5 mg by mouth daily.    .Marland KitchenPRESCRIPTION MEDICATION Inject 1 application into the skin every Monday, Wednesday, and Friday. HCG Injection    . PRESCRIPTION MEDICATION Inject 1 application into the skin every Monday, Wednesday, and Friday. Lipotropic injection    . rizatriptan (MAXALT) 10 MG tablet Take 10 mg by mouth daily as needed for migraine.       Blood pressure 127/83, pulse 120, temperature 98.3 F (36.8 C), temperature source Rectal, resp. rate 18, height _0  (1.549 m), weight 85.3 kg (188 lb 0.8 oz), SpO2 98 %. Physical Exam: General: pleasant, WD/WN white female who appears grossly nauseated, pale, sitting up in bed vomiting.   HEENT: head is normocephalic, atraumatic.  Sclera are noninjected. No scleral icterus. PERRL.  Ears and nose without any masses or lesions.  Mouth is pink and moist. Mild erythema in throat, no swelling  or exudate. Heart: regular, rate, and rhythm.  No obvious murmurs, gallops, or rubs noted.  Palpable pedal pulses bilaterally Lungs: CTAB, no wheezes, rhonchi, or rales noted.  Respiratory effort nonlabored Abd: soft, moderately distended, exquisitely tender in the upper abdomen (RUQ and epigastrium), diminished BS, no masses, hernias, or  organomegaly MS: all 4 extremities are symmetrical with no cyanosis, clubbing, or edema. Skin: warm and dry with no masses, lesions, or rashes. Psych: A&Ox3 with an appropriate affect. Neuro: Moves all 4 extremities, normal speech  Results for orders placed or performed during the hospital encounter of 08/06/14 (from the past 48 hour(s))  Comprehensive metabolic panel     Status: Abnormal   Collection Time: 08/06/14  8:07 PM  Result Value Ref Range   Sodium 138 135 - 145 mmol/L   Potassium 4.7 3.5 - 5.1 mmol/L   Chloride 107 96 - 112 mmol/L   CO2 20 19 - 32 mmol/L   Glucose, Bld 155 (H) 70 - 99 mg/dL   BUN 6 6 - 23 mg/dL   Creatinine, Ser 0.86 0.50 - 1.10 mg/dL   Calcium 8.0 (L) 8.4 - 10.5 mg/dL   Total Protein 7.1 6.0 - 8.3 g/dL   Albumin 3.4 (L) 3.5 - 5.2 g/dL   AST 45 (H) 0 - 37 U/L   ALT 55 (H) 0 - 35 U/L   Alkaline Phosphatase 60 39 - 117 U/L   Total Bilirubin 0.9 0.3 - 1.2 mg/dL   GFR calc non Af Amer >90 >90 mL/min   GFR calc Af Amer >90 >90 mL/min    Comment: (NOTE) The eGFR has been calculated using the CKD EPI equation. This calculation has not been validated in all clinical situations. eGFR's persistently <90 mL/min signify possible Chronic Kidney Disease.    Anion gap 11 5 - 15  Magnesium     Status: None   Collection Time: 08/06/14  8:07 PM  Result Value Ref Range   Magnesium 1.6 1.5 - 2.5 mg/dL  Phosphorus     Status: None   Collection Time: 08/06/14  8:07 PM  Result Value Ref Range   Phosphorus 3.5 2.3 - 4.6 mg/dL  CBC WITH DIFFERENTIAL     Status: Abnormal   Collection Time: 08/06/14  8:07 PM  Result Value Ref Range   WBC 21.8 (H) 4.0 - 10.5 K/uL   RBC 5.32 (H) 3.87 - 5.11 MIL/uL   Hemoglobin 16.4 (H) 12.0 - 15.0 g/dL   HCT 47.9 (H) 36.0 - 46.0 %   MCV 90.0 78.0 - 100.0 fL   MCH 30.8 26.0 - 34.0 pg   MCHC 34.2 30.0 - 36.0 g/dL   RDW 12.5 11.5 - 15.5 %   Platelets 284 150 - 400 K/uL   Neutrophils Relative % 92 (H) 43 - 77 %   Neutro Abs 20.1 (H)  1.7 - 7.7 K/uL   Lymphocytes Relative 4 (L) 12 - 46 %   Lymphs Abs 0.8 0.7 - 4.0 K/uL   Monocytes Relative 4 3 - 12 %   Monocytes Absolute 1.0 0.1 - 1.0 K/uL   Eosinophils Relative 0 0 - 5 %   Eosinophils Absolute 0.0 0.0 - 0.7 K/uL   Basophils Relative 0 0 - 1 %   Basophils Absolute 0.0 0.0 - 0.1 K/uL  APTT     Status: Abnormal   Collection Time: 08/06/14  8:07 PM  Result Value Ref Range   aPTT 22 (L) 24 - 37 seconds  Protime-INR  Status: None   Collection Time: 08/06/14  8:07 PM  Result Value Ref Range   Prothrombin Time 13.2 11.6 - 15.2 seconds   INR 0.99 0.00 - 1.49  Lipase, blood     Status: Abnormal   Collection Time: 08/06/14  8:07 PM  Result Value Ref Range   Lipase 400 (H) 11 - 59 U/L  Lactic acid, plasma     Status: None   Collection Time: 08/06/14  8:57 PM  Result Value Ref Range   Lactic Acid, Venous 2.0 0.5 - 2.0 mmol/L  Lactic acid, plasma     Status: Abnormal   Collection Time: 08/06/14 11:31 PM  Result Value Ref Range   Lactic Acid, Venous 2.3 (HH) 0.5 - 2.0 mmol/L    Comment: REPEATED TO VERIFY CRITICAL RESULT CALLED TO, READ BACK BY AND VERIFIED WITH: SAPKOTA,S RN 0030 08/07/14 TEETERN    No results found.   Assessment/Plan Acute gallstone pancreatitis (transfer from [facility] ER) with likely acute cholecystitis -CT shows single 19m gallstone, GB wall thickening, pericholecystic fluid, enlarged pancreas with peri-pancreatic edema -Lipase back up to 795 with continued N/V and abdominal pain -Await improvement in lipase and abdominal exam prior to considering lap chole.  Continue conservative management with aggressive nausea control.  Strictly NPO except a few ice chips until pain resolved, even liquids can exacerbate her pancreatitis.  She will need lap chole prior to discharge home.  At the rate she's going it may take up to a week before she's ready for OR. -IVF, pain control, antiemetics -On Zosyn 08/06/14 ---> Leukocytosis -30.9, on  zosyn Transaminitis -Improving Obesity - BMI 35   [name] Hilsinger, PA-S [facility] 08/07/2014, 10:22 AM  Office: [phone]  ----------------------------------------------------------------------------------------------------------- General Surgery PA Preceptor Note:  I agree with the above PA students findings as above.     MCoralie Keens PA-C General Surgery [facility] Pager: [phone]: [phone]

## 2014-08-07 NOTE — Progress Notes (Signed)
Utilization review completed.  

## 2014-08-08 ENCOUNTER — Inpatient Hospital Stay (HOSPITAL_COMMUNITY): Payer: Managed Care, Other (non HMO)

## 2014-08-08 LAB — COMPREHENSIVE METABOLIC PANEL
ALBUMIN: 2.6 g/dL — AB (ref 3.5–5.2)
ALT: 29 U/L (ref 0–35)
AST: 34 U/L (ref 0–37)
Alkaline Phosphatase: 41 U/L (ref 39–117)
Anion gap: 12 (ref 5–15)
BUN: 14 mg/dL (ref 6–23)
CHLORIDE: 108 mmol/L (ref 96–112)
CO2: 21 mmol/L (ref 19–32)
CREATININE: 1.04 mg/dL (ref 0.50–1.10)
Calcium: 7.1 mg/dL — ABNORMAL LOW (ref 8.4–10.5)
GFR calc Af Amer: 88 mL/min — ABNORMAL LOW (ref >90)
GFR calc non Af Amer: 76 mL/min — ABNORMAL LOW (ref >90)
Glucose, Bld: 159 mg/dL — ABNORMAL HIGH (ref 70–99)
POTASSIUM: 4.2 mmol/L (ref 3.5–5.1)
Sodium: 141 mmol/L (ref 135–145)
Total Bilirubin: 0.8 mg/dL (ref 0.3–1.2)
Total Protein: 6 g/dL (ref 6.0–8.3)

## 2014-08-08 LAB — CBC
HCT: 48.9 % — ABNORMAL HIGH (ref 36.0–46.0)
Hemoglobin: 16.5 g/dL — ABNORMAL HIGH (ref 12.0–15.0)
MCH: 31.2 pg (ref 26.0–34.0)
MCHC: 33.7 g/dL (ref 30.0–36.0)
MCV: 92.4 fL (ref 78.0–100.0)
PLATELETS: 291 10*3/uL (ref 150–400)
RBC: 5.29 MIL/uL — ABNORMAL HIGH (ref 3.87–5.11)
RDW: 13.3 % (ref 11.5–15.5)
WBC: 39.2 10*3/uL — AB (ref 4.0–10.5)

## 2014-08-08 LAB — TRIGLYCERIDES: TRIGLYCERIDES: 39 mg/dL (ref <150)

## 2014-08-08 LAB — LIPASE, BLOOD: LIPASE: 576 U/L — AB (ref 11–59)

## 2014-08-08 LAB — MRSA PCR SCREENING: MRSA by PCR: NEGATIVE

## 2014-08-08 MED ORDER — BISACODYL 10 MG RE SUPP
10.0000 mg | Freq: Once | RECTAL | Status: AC
  Administered 2014-08-08: 10 mg via RECTAL
  Filled 2014-08-08: qty 1

## 2014-08-08 MED ORDER — PROMETHAZINE HCL 25 MG/ML IJ SOLN
12.5000 mg | Freq: Once | INTRAMUSCULAR | Status: AC
  Administered 2014-08-08: 12.5 mg via INTRAVENOUS
  Filled 2014-08-08: qty 1

## 2014-08-08 MED ORDER — ONDANSETRON HCL 4 MG/2ML IJ SOLN
4.0000 mg | Freq: Four times a day (QID) | INTRAMUSCULAR | Status: DC | PRN
  Administered 2014-08-08: 4 mg via INTRAVENOUS
  Filled 2014-08-08 (×2): qty 2

## 2014-08-08 MED ORDER — BISACODYL 10 MG RE SUPP
10.0000 mg | Freq: Once | RECTAL | Status: DC
  Filled 2014-08-08: qty 1

## 2014-08-08 NOTE — Progress Notes (Signed)
Pt HR 140s, Paged MD. Awaiting callback, will continue to monitor.

## 2014-08-08 NOTE — Progress Notes (Signed)
[facility] PROGRESS NOTE  [name] Blanchard ZOX:[id number] DOB: 08-17-1990 DOA: 08/06/2014 PCP: No PCP Per Patient  Assessment/Plan: 24 y/o female with with chronic intermittent abdominal pains for 6 month is admitted with acute pancreatitis  -Patient is transferred from [facility] ER: lipase over 37,000 , mildly elevated lfts nml alk phos and tbili; CT showing stone in gallbladder, GB wall thickening, edema around pancreas, cbd dilation with no stone.   1. Acute severe pancreatitis, suspected gallstone pancreatitis; CT abd as above; lipase 37000-->400 ->700; bili 0.9; pend today's labs  -clinically still abd pains, no vomiting; no BM yet; will cont NPO, increase IVF, cont antiemetics, IV atx; check triglyceride, cont pain control; increase bowel regimen; pend KUB, HIDA; appreciate surgery input   2. SIRS/Sepsis presented on admission in the setting of acute pancreatitis; leukocytosis, sinus tachycardia, BP is stable   -will cont IVF, IV atx; will TF to SDU for close monitor   D/w patient, her boyfriend at the bedside  -will Tf to SDU     Code Status: full Family Communication: d/w patient, her mother  (indicate person spoken with, relationship, and if by phone, the number) Disposition Plan: pend clinical improvement    Consultants:  Surgery   Procedures:  none  Antibiotics:  Zosyn 4/10>>>   (indicate start date, and stop date if known)  HPI/Subjective: Alert, oriented   Objective: Filed Vitals:   08/08/14 1200  BP: 137/105  Pulse:   Temp: 97.9 F (36.6 C)  Resp: 16    Intake/Output Summary (Last 24 hours) at 08/08/14 1212 Last data filed at 08/08/14 0900  Gross per 24 hour  Intake 3847.5 ml  Output    650 ml  Net 3197.5 ml   Filed Weights   08/06/14 1800 08/07/14 0531 08/08/14 0613  Weight: 85.4 kg (188 lb 4.4 oz) 85.3 kg (188 lb 0.8 oz) 87.499 kg (192 lb 14.4 oz)    Exam:   General:  alert  Cardiovascular: s1,s2 rr; mild tachy   Respiratory: no  wheezing   Abdomen: distended, mild diffuse tender, no rebound  Musculoskeletal: no leg pitting edema    Data Reviewed: Basic Metabolic Panel:  Recent Labs Lab 08/06/14 2007 08/07/14 0835  NA 138 139  K 4.7 4.5  CL 107 106  CO2 20 19  GLUCOSE 155* 121*  BUN 6 9  CREATININE 0.86 0.92  CALCIUM 8.0* 7.9*  MG 1.6  --   PHOS 3.5  --    Liver Function Tests:  Recent Labs Lab 08/06/14 2007 08/07/14 0835  AST 45* 37  ALT 55* 42*  ALKPHOS 60 52  BILITOT 0.9 0.8  PROT 7.1 6.9  ALBUMIN 3.4* 3.1*    Recent Labs Lab 08/06/14 2007 08/07/14 0835  LIPASE 400* 795*   No results for input(s): AMMONIA in the last 168 hours. CBC:  Recent Labs Lab 08/06/14 2007 08/07/14 0835 08/08/14 0803  WBC 21.8* 30.9* 39.2*  NEUTROABS 20.1*  --   --   HGB 16.4* 17.4* 16.5*  HCT 47.9* 52.1* 48.9*  MCV 90.0 92.4 92.4  PLT 284 368 291   Cardiac Enzymes: No results for input(s): CKTOTAL, CKMB, CKMBINDEX, TROPONINI in the last 168 hours. BNP (last 3 results) No results for input(s): BNP in the last 8760 hours.  ProBNP (last 3 results) No results for input(s): PROBNP in the last 8760 hours.  CBG: No results for input(s): GLUCAP in the last 168 hours.  No results found for this or any previous visit (from the past  240 hour(s)).   Studies: No results found.  Scheduled Meds: . antiseptic oral rinse  7 mL Mouth Rinse q12n4p  . chlorhexidine  15 mL Mouth Rinse BID  . enoxaparin (LOVENOX) injection  40 mg Subcutaneous Q24H  . ondansetron  4 mg Intravenous Q4H  . piperacillin-tazobactam (ZOSYN)  IV  3.375 g Intravenous Q8H  . senna-docusate  1 tablet Oral BID  . sodium chloride  3 mL Intravenous Q12H   Continuous Infusions: . sodium chloride 150 mL/hr at 08/08/14 0449    Principal Problem:   Pancreatitis, acute Active Problems:   Abdominal pain   Gallstones   Common bile duct dilatation   Leukocytosis   Acute pancreatitis    Time spent: >35 minutes     [name]  [facility] Pager [phone]. If 7PM-7AM, please contact night-coverage at [email or web address], password [facility] 08/08/2014, 12:12 PM  LOS: 2 days

## 2014-08-08 NOTE — Progress Notes (Signed)
[facility] Progress Note     Subjective: Numerous episodes of bilious vomiting yesterday.  Pt has less nausea and vomiting than yesterday, but she's still having significant pain.  She says her period cramps are pretty bad.  Upper abdominal pain still significant.  Ambulating better.  Husband at bedside.   Urinating low output.  No flatus or BM yet either.    Objective: Vital signs in last 24 hours: Temp:  [97.8 F (36.6 C)-100.1 F (37.8 C)] 97.8 F (36.6 C) (04/12 0613) Pulse Rate:  [129-137] 137 (04/12 0613) Resp:  [16-20] 20 (04/12 0613) BP: (121-135)/(80-91) 122/80 mmHg (04/12 0613) SpO2:  [96 %] 96 % (04/12 5784) Weight:  [87.499 kg (192 lb 14.4 oz)] 87.499 kg (192 lb 14.4 oz) (04/12 6962) Last BM Date: 08/04/14  Intake/Output from previous day: 04/11 0701 - 04/12 0700 In: 3847.5 [I.V.:3697.5; IV Piggyback:150] Out: 300 [Urine:300] Intake/Output this shift: Total I/O In: 0  Out: 350 [Urine:350]  PE: Gen:  Alert, NAD, pleasant Abd: Soft, distended, tender to palpation throughout to even light touch, +BS, no HSM, no abdominal scars noted   Lab Results:   Recent Labs  08/07/14 0835 08/08/14 0803  WBC 30.9* PENDING  HGB 17.4* 16.5*  HCT 52.1* 48.9*  PLT 368 PENDING   BMET  Recent Labs  08/06/14 2007 08/07/14 0835  NA 138 139  K 4.7 4.5  CL 107 106  CO2 20 19  GLUCOSE 155* 121*  BUN 6 9  CREATININE 0.86 0.92  CALCIUM 8.0* 7.9*   PT/INR  Recent Labs  08/06/14 2007  LABPROT 13.2  INR 0.99   CMP     Component Value Date/Time   NA 139 08/07/2014 0835   K 4.5 08/07/2014 0835   CL 106 08/07/2014 0835   CO2 19 08/07/2014 0835   GLUCOSE 121* 08/07/2014 0835   BUN 9 08/07/2014 0835   CREATININE 0.92 08/07/2014 0835   CALCIUM 7.9* 08/07/2014 0835   PROT 6.9 08/07/2014 0835   ALBUMIN 3.1* 08/07/2014 0835   AST 37 08/07/2014 0835   ALT 42* 08/07/2014 0835   ALKPHOS 52 08/07/2014 0835   BILITOT 0.8 08/07/2014 0835   GFRNONAA 88*  08/07/2014 0835   GFRAA >90 08/07/2014 0835   Lipase     Component Value Date/Time   LIPASE 795* 08/07/2014 0835       Studies/Results: No results found.  Anti-infectives: Anti-infectives    Start     Dose/Rate Route Frequency Ordered Stop   08/07/14 0400  piperacillin-tazobactam (ZOSYN) IVPB 3.375 g     3.375 g 12.5 mL/hr over 240 Minutes Intravenous Every 8 hours 08/06/14 1910     08/06/14 1930  piperacillin-tazobactam (ZOSYN) IVPB 3.375 g     3.375 g 12.5 mL/hr over 240 Minutes Intravenous  Once 08/06/14 1909 08/06/14 2340       Assessment/Plan HD #3 Severe acute gallstone pancreatitis (transfer from [facility] ER) with likely acute cholecystitis -CT shows single 4mm gallstone, GB wall thickening, pericholecystic fluid, enlarged pancreas with peri-pancreatic edema -Order HIDA scan, pending results -Lipase up to 795 yesterday with continued N/V and abdominal pain -Pending repeat labs today  -Await improvement in lipase and abdominal exam prior to considering lap chole. Continue conservative management with aggressive nausea control. Strictly NPO except a few ice chips until pain resolved, even liquids can exacerbate her pancreatitis. She will need lap chole prior to discharge home. At the rate she's going it may take up to a week before she's ready  for OR. -IVF, pain control, antiemetics -On Zosyn 08/06/14 Day #3 ---> -Ice packs -She's being transferred to SDU for closer monitoring Leukocytosis -30.9 yesterday, on zosyn Transaminitis -Improving Obesity - BMI 35 Chronic constipation -dulcolax suppository, fleets enema if not improved could be tried Dysmenorrhea on menstrual cycle -OCP on hold due to NPO    LOS: 2 days    DORT, Valrie Jia 08/08/2014, 10:20 AM Pager: [phone]

## 2014-08-09 ENCOUNTER — Inpatient Hospital Stay (HOSPITAL_COMMUNITY): Payer: Managed Care, Other (non HMO)

## 2014-08-09 LAB — CBC
HCT: 42.2 % (ref 36.0–46.0)
HEMOGLOBIN: 13.9 g/dL (ref 12.0–15.0)
MCH: 30.5 pg (ref 26.0–34.0)
MCHC: 32.9 g/dL (ref 30.0–36.0)
MCV: 92.5 fL (ref 78.0–100.0)
PLATELETS: 282 10*3/uL (ref 150–400)
RBC: 4.56 MIL/uL (ref 3.87–5.11)
RDW: 13.3 % (ref 11.5–15.5)
WBC: 28.1 10*3/uL — AB (ref 4.0–10.5)

## 2014-08-09 LAB — COMPREHENSIVE METABOLIC PANEL
ALBUMIN: 2.3 g/dL — AB (ref 3.5–5.2)
ALK PHOS: 54 U/L (ref 39–117)
ALT: 28 U/L (ref 0–35)
AST: 32 U/L (ref 0–37)
Anion gap: 10 (ref 5–15)
BILIRUBIN TOTAL: 0.8 mg/dL (ref 0.3–1.2)
BUN: 11 mg/dL (ref 6–23)
CHLORIDE: 107 mmol/L (ref 96–112)
CO2: 23 mmol/L (ref 19–32)
Calcium: 6.6 mg/dL — ABNORMAL LOW (ref 8.4–10.5)
Creatinine, Ser: 0.92 mg/dL (ref 0.50–1.10)
GFR calc Af Amer: >90 mL/min (ref >90)
GFR calc non Af Amer: 88 mL/min — ABNORMAL LOW (ref >90)
Glucose, Bld: 136 mg/dL — ABNORMAL HIGH (ref 70–99)
Potassium: 3.7 mmol/L (ref 3.5–5.1)
Sodium: 140 mmol/L (ref 135–145)
TOTAL PROTEIN: 5.9 g/dL — AB (ref 6.0–8.3)

## 2014-08-09 LAB — LIPASE, BLOOD: Lipase: 183 U/L — ABNORMAL HIGH (ref 11–59)

## 2014-08-09 MED ORDER — SINCALIDE 5 MCG IJ SOLR
INTRAMUSCULAR | Status: AC
  Administered 2014-08-09: 1.8 ug via INTRAVENOUS
  Filled 2014-08-09: qty 5

## 2014-08-09 MED ORDER — BISACODYL 10 MG RE SUPP
10.0000 mg | Freq: Every day | RECTAL | Status: DC | PRN
  Administered 2014-08-13 – 2014-08-20 (×5): 10 mg via RECTAL
  Filled 2014-08-09 (×8): qty 1

## 2014-08-09 MED ORDER — TECHNETIUM TC 99M MEBROFENIN IV KIT
5.0000 | PACK | Freq: Once | INTRAVENOUS | Status: AC | PRN
  Administered 2014-08-09: 5 via INTRAVENOUS

## 2014-08-09 MED ORDER — DIPHENHYDRAMINE HCL 50 MG/ML IJ SOLN: 12.5000 mg | Freq: Four times a day (QID) | INTRAMUSCULAR | Status: DC | PRN

## 2014-08-09 MED ORDER — NALOXONE HCL 0.4 MG/ML IJ SOLN: 0.4000 mg | INTRAMUSCULAR | Status: DC | PRN

## 2014-08-09 MED ORDER — PROMETHAZINE HCL 25 MG/ML IJ SOLN
12.5000 mg | Freq: Four times a day (QID) | INTRAMUSCULAR | Status: DC | PRN
  Administered 2014-08-09 – 2014-08-10 (×2): 25 mg via INTRAVENOUS
  Administered 2014-08-10: 12.5 mg via INTRAVENOUS
  Administered 2014-08-10 – 2014-08-15 (×11): 25 mg via INTRAVENOUS
  Administered 2014-08-19 – 2014-08-23 (×5): 12.5 mg via INTRAVENOUS
  Filled 2014-08-09 (×18): qty 1

## 2014-08-09 MED ORDER — SODIUM CHLORIDE 0.9 % IJ SOLN: 9.0000 mL | INTRAMUSCULAR | Status: DC | PRN

## 2014-08-09 MED ORDER — SINCALIDE 5 MCG IJ SOLR
0.0200 ug/kg | Freq: Once | INTRAMUSCULAR | Status: AC
  Administered 2014-08-09: 1.8 ug via INTRAVENOUS
  Filled 2014-08-09: qty 5

## 2014-08-09 MED ORDER — HYDROMORPHONE 0.3 MG/ML IV SOLN
INTRAVENOUS | Status: DC
  Administered 2014-08-09: 18:00:00 via INTRAVENOUS
  Administered 2014-08-09: 1.5 mg via INTRAVENOUS
  Administered 2014-08-10 (×2): via INTRAVENOUS
  Administered 2014-08-10: 2.7 mg via INTRAVENOUS
  Administered 2014-08-10: 0.9 mg via INTRAVENOUS
  Administered 2014-08-10: 1.8 mg via INTRAVENOUS
  Administered 2014-08-11: 0.9 mg via INTRAVENOUS
  Administered 2014-08-11: 15:00:00 via INTRAVENOUS
  Administered 2014-08-11: 0.9 mg via INTRAVENOUS
  Administered 2014-08-11 (×2): 1.5 mg via INTRAVENOUS
  Administered 2014-08-12: 18:00:00 via INTRAVENOUS
  Administered 2014-08-12: 0.9 mg via INTRAVENOUS
  Administered 2014-08-12 (×2): 0.6 mg via INTRAVENOUS
  Administered 2014-08-12: 3 mg via INTRAVENOUS
  Administered 2014-08-12: 5 mg via INTRAVENOUS
  Administered 2014-08-13: 15:00:00 via INTRAVENOUS
  Administered 2014-08-13: 0.6 mg via INTRAVENOUS
  Administered 2014-08-13: 1.8 mg via INTRAVENOUS
  Administered 2014-08-13: 1.5 mg via INTRAVENOUS
  Administered 2014-08-13: 0 mg via INTRAVENOUS
  Administered 2014-08-14: 0.6 mg via INTRAVENOUS
  Administered 2014-08-14: 0.12 mg via INTRAVENOUS
  Administered 2014-08-14: 1.5 mg via INTRAVENOUS
  Administered 2014-08-14: 0.21 mg via INTRAVENOUS
  Administered 2014-08-14: 1.5 mg via INTRAVENOUS
  Administered 2014-08-14: 0.3 mL via INTRAVENOUS
  Administered 2014-08-14 – 2014-08-15 (×3): 0.9 mg via INTRAVENOUS
  Filled 2014-08-09 (×7): qty 25

## 2014-08-09 MED ORDER — ONDANSETRON HCL 4 MG/2ML IJ SOLN
4.0000 mg | INTRAMUSCULAR | Status: DC | PRN
  Administered 2014-08-09 – 2014-08-14 (×19): 4 mg via INTRAVENOUS
  Filled 2014-08-09 (×21): qty 2

## 2014-08-09 MED ORDER — DIPHENHYDRAMINE HCL 12.5 MG/5ML PO ELIX
12.5000 mg | ORAL_SOLUTION | Freq: Four times a day (QID) | ORAL | Status: DC | PRN
  Filled 2014-08-09: qty 5

## 2014-08-09 MED ORDER — PANTOPRAZOLE SODIUM 40 MG IV SOLR
40.0000 mg | Freq: Two times a day (BID) | INTRAVENOUS | Status: DC
  Administered 2014-08-09 – 2014-08-19 (×20): 40 mg via INTRAVENOUS
  Filled 2014-08-09 (×23): qty 40

## 2014-08-09 MED ORDER — ONDANSETRON HCL 4 MG/2ML IJ SOLN
4.0000 mg | Freq: Four times a day (QID) | INTRAMUSCULAR | Status: DC | PRN
Start: 2014-08-09 — End: 2014-08-09

## 2014-08-09 MED ORDER — STERILE WATER FOR INJECTION IJ SOLN
INTRAMUSCULAR | Status: AC
  Filled 2014-08-09: qty 10

## 2014-08-09 NOTE — Progress Notes (Signed)
Pt vomited clear emesis multiple times. Given Zofran 4 mg scheduled q4 hrs and Zofran 4 mg PRN at 2215 with minimal relief. Abdominal pain has been rated 10/10 when pt is awake. Dilaudid 4 mg PRN was administered at 2211. Will continue to monitor.  Alba DestineMisty L Ennis, RN, BSN

## 2014-08-09 NOTE — Progress Notes (Signed)
  Subjective: PT ON BEDSIDE COMMODE  OVERALL ABOUT THE SAME   Objective: Vital signs in last 24 hours: Temp:  [97.6 F (36.4 C)-98.4 F (36.9 C)] 98 F (36.7 C) (04/13 0400) Pulse Rate:  [119-143] 125 (04/13 0400) Resp:  [16-46] 16 (04/13 0400) BP: (118-142)/(64-105) 132/77 mmHg (04/13 0400) SpO2:  [88 %-98 %] 95 % (04/13 0400) Weight:  [91.5 kg (201 lb 11.5 oz)] 91.5 kg (201 lb 11.5 oz) (04/12 1604) Last BM Date: 08/04/14  Intake/Output from previous day: 04/12 0701 - 04/13 0700 In: 4098.14669.2 [I.V.:4519.2; IV Piggyback:150] Out: 750 [Urine:750] Intake/Output this shift:    GI: TENDER EPIGASTRIUM  Lab Results:   Recent Labs  08/08/14 0803 08/09/14 0326  WBC 39.2* 28.1*  HGB 16.5* 13.9  HCT 48.9* 42.2  PLT 291 282   BMET  Recent Labs  08/08/14 1209 08/09/14 0326  NA 141 140  K 4.2 3.7  CL 108 107  CO2 21 23  GLUCOSE 159* 136*  BUN 14 11  CREATININE 1.04 0.92  CALCIUM 7.1* 6.6*   PT/INR  Recent Labs  08/06/14 2007  LABPROT 13.2  INR 0.99   ABG No results for input(s): PHART, HCO3 in the last 72 hours.  Invalid input(s): PCO2, PO2  Studies/Results: Dg Abd 1 View  08/08/2014   CLINICAL DATA:  Abdominal pain, vomiting and constipation for 2 days. Acute pancreatitis.  EXAM: ABDOMEN - 1 VIEW  COMPARISON:  CT abdomen and pelvis and chest and two views abdomen.  FINDINGS: The bowel gas pattern is normal. No radio-opaque calculi or other significant radiographic abnormality are seen.  IMPRESSION: Negative exam.   Electronically Signed   By: Drusilla Kannerhomas  Dalessio M.D.   On: 08/08/2014 13:14    Anti-infectives: Anti-infectives    Start     Dose/Rate Route Frequency Ordered Stop   08/07/14 0400  piperacillin-tazobactam (ZOSYN) IVPB 3.375 g     3.375 g 12.5 mL/hr over 240 Minutes Intravenous Every 8 hours 08/06/14 1910     08/06/14 1930  piperacillin-tazobactam (ZOSYN) IVPB 3.375 g     3.375 g 12.5 mL/hr over 240 Minutes Intravenous  Once 08/06/14 1909  08/06/14 2340      Assessment/Plan: Gallstone pancreatitis Hopefully do HIDA today.  Labs better.  If signs of cholecystitis,  May need perc drain. If not,  Lap chole once pancreatitis improves May need TNA / PICC      LOS: 3 days    Ersilia Brawley A. 08/09/2014

## 2014-08-09 NOTE — Progress Notes (Signed)
ANTIBIOTIC CONSULT NOTE - FOLLOW UP  Pharmacy Consult for Zosyn Indication: Intra-abdominal infection  No Known Allergies  Patient Measurements: Height: 5\' 5"  (165.1 cm) Weight: 201 lb 11.5 oz (91.5 kg) IBW/kg (Calculated) : 57  Vital Signs: Temp: 98 F (36.7 C) (04/13 0400) Temp Source: Axillary (04/13 0400) BP: 132/77 mmHg (04/13 0400) Pulse Rate: 125 (04/13 0400) Intake/Output from previous day: 04/12 0701 - 04/13 0700 In: 1610.94669.2 [I.V.:4519.2; IV Piggyback:150] Out: 750 [Urine:750] Intake/Output from this shift:    Labs:  Recent Labs  08/07/14 0835 08/08/14 0803 08/08/14 1209 08/09/14 0326  WBC 30.9* 39.2*  --  28.1*  HGB 17.4* 16.5*  --  13.9  PLT 368 291  --  282  CREATININE 0.92  --  1.04 0.92   Estimated Creatinine Clearance: 107.2 mL/min (by C-G formula based on Cr of 0.92). No results for input(s): VANCOTROUGH, VANCOPEAK, VANCORANDOM, GENTTROUGH, GENTPEAK, GENTRANDOM, TOBRATROUGH, TOBRAPEAK, TOBRARND, AMIKACINPEAK, AMIKACINTROU, AMIKACIN in the last 72 hours.   Microbiology: Recent Results (from the past 720 hour(s))  MRSA PCR Screening     Status: None   Collection Time: 08/08/14  4:00 PM  Result Value Ref Range Status   MRSA by PCR NEGATIVE NEGATIVE Final    Comment:        The GeneXpert MRSA Assay (FDA approved for NASAL specimens only), is one component of a comprehensive MRSA colonization surveillance program. It is not intended to diagnose MRSA infection nor to guide or monitor treatment for MRSA infections.     Anti-infectives    Start     Dose/Rate Route Frequency Ordered Stop   08/07/14 0400  piperacillin-tazobactam (ZOSYN) IVPB 3.375 g     3.375 g 12.5 mL/hr over 240 Minutes Intravenous Every 8 hours 08/06/14 1910     08/06/14 1930  piperacillin-tazobactam (ZOSYN) IVPB 3.375 g     3.375 g 12.5 mL/hr over 240 Minutes Intravenous  Once 08/06/14 1909 08/06/14 2340      Assessment: 23 YOF who presented with acute pancreatitis and  continues on Zosyn for empiric coverage. Lipase trending down - surgery planning HIDA scan and possible perc drain placement today. Renal function remains stable - no adjustments needed at this time.   Goal of Therapy:  Proper antibiotics for infection/cultures adjusted for renal/hepatic function   Plan:  1. Continue Zosyn 3.375g IV every 8 hours (infused over 4 hours) 2. Will continue to follow renal function, culture results, LOT, and antibiotic de-escalation plans   Georgina PillionElizabeth Brando Taves, PharmD, BCPS Clinical Pharmacist Pager: [phone] 08/09/2014 10:47 AM

## 2014-08-09 NOTE — Progress Notes (Signed)
[place] TEAM 1 - Stepdown/ICU TEAM Progress Note  Kinya Meine QBH:[id number] DOB: 1990/05/25 DOA: 08/06/2014 PCP: No PCP Per Patient  Admit HPI / Brief Narrative: 24 y/o female with with chronic intermittent abdominal pains for 6 month admitted with acute pancreatitis.  Patient was transferred from [facility] ER where lipase was reportedly over 37,000, w/ mildly elevated lfts, nml alk phos and tbili; and a CT showing a stone in the gallbladder w/ GB wall thickening + edema around the pancreas w/ cbd dilation but with no stone.   HPI/Subjective: Pt c/o severe epigastric pain piercing through to her back which is only relieved by her current pain med for ~1hr.  She c/o sob related to pain w/ deep breaths.  She denies cp, but does relate nausea w/ frequent retching.    Assessment/Plan:  Acute severe gallstone pancreatitis Lipase has been fluctuating, but is on a signif downward turn today - Gen Surg following - will require cholecystectomy prior to d/c once more stable - begin PCA and follow   SIRS in the setting of acute pancreatitis SIRS physiology persists - check PCXR in AM to assure no evidence of early ARDS change   Obesity - Body mass index is 33.57 kg/(m^2).  Dysmenorrhea on menstrual cycle  Code Status: FULL Family Communication: spoke w/ mother at length at bedside  Disposition Plan: SDU   Consultants: Gen Surgery   Procedures: HIDA - 4/13 - unremarkable   Antibiotics: Zosyn 4/10 >  DVT prophylaxis: SCDs  Objective: Blood pressure 141/85, pulse 130, temperature 98.3 F (36.8 C), temperature source Axillary, resp. rate 32, height 5' 5"  (1.651 m), weight 91.5 kg (201 lb 11.5 oz), last menstrual period 08/06/2014, SpO2 95 %.  Intake/Output Summary (Last 24 hours) at 08/09/14 1625 Last data filed at 08/09/14 0700  Gross per 24 hour  Intake   3150 ml  Output    400 ml  Net   2750 ml   Exam: General: No acute respiratory distress Lungs: Clear to auscultation  bilaterally without wheezes or crackles Cardiovascular: tachycardic but regular - no gallup or rub  Abdomen: Obese, very tender diffusely, soft, bs absent, no appreciable mass w/ gently palpation  Extremities: No significant cyanosis, or clubbing;  1+ edema bilateral lower extremities  Data Reviewed: Basic Metabolic Panel:  Recent Labs Lab 08/06/14 2007 08/07/14 0835 08/08/14 1209 08/09/14 0326  NA 138 139 141 140  K 4.7 4.5 4.2 3.7  CL 107 106 108 107  CO2 20 19 21 23   GLUCOSE 155* 121* 159* 136*  BUN 6 9 14 11   CREATININE 0.86 0.92 1.04 0.92  CALCIUM 8.0* 7.9* 7.1* 6.6*  MG 1.6  --   --   --   PHOS 3.5  --   --   --     Liver Function Tests:  Recent Labs Lab 08/06/14 2007 08/07/14 0835 08/08/14 1209 08/09/14 0326  AST 45* 37 34 32  ALT 55* 42* 29 28  ALKPHOS 60 52 41 54  BILITOT 0.9 0.8 0.8 0.8  PROT 7.1 6.9 6.0 5.9*  ALBUMIN 3.4* 3.1* 2.6* 2.3*    Recent Labs Lab 08/06/14 2007 08/07/14 0835 08/08/14 1209 08/09/14 0326  LIPASE 400* 795* 576* 183*   Coags:  Recent Labs Lab 08/06/14 2007  INR 0.99    Recent Labs Lab 08/06/14 2007  APTT 22*    CBC:  Recent Labs Lab 08/06/14 2007 08/07/14 0835 08/08/14 0803 08/09/14 0326  WBC 21.8* 30.9* 39.2* 28.1*  NEUTROABS 20.1*  --   --   --  HGB 16.4* 17.4* 16.5* 13.9  HCT 47.9* 52.1* 48.9* 42.2  MCV 90.0 92.4 92.4 92.5  PLT 284 368 291 282    Recent Results (from the past 240 hour(s))  MRSA PCR Screening     Status: None   Collection Time: 08/08/14  4:00 PM  Result Value Ref Range Status   MRSA by PCR NEGATIVE NEGATIVE Final    Comment:        The GeneXpert MRSA Assay (FDA approved for NASAL specimens only), is one component of a comprehensive MRSA colonization surveillance program. It is not intended to diagnose MRSA infection nor to guide or monitor treatment for MRSA infections.      Studies:   Recent x-ray studies have been reviewed in detail by the Attending  Physician  Scheduled Meds:  Scheduled Meds: . antiseptic oral rinse  7 mL Mouth Rinse q12n4p  . bisacodyl  10 mg Rectal Once  . chlorhexidine  15 mL Mouth Rinse BID  . enoxaparin (LOVENOX) injection  40 mg Subcutaneous Q24H  . ondansetron  4 mg Intravenous Q4H  . piperacillin-tazobactam (ZOSYN)  IV  3.375 g Intravenous Q8H  . senna-docusate  1 tablet Oral BID  . sodium chloride  3 mL Intravenous Q12H  . sterile water (preservative free)        Time spent on care of this patient: 35 mins   Reynald Woods T , MD   [facility] Office  [phone] Pager - Text Page per Shea Evans as per below:  On-Call/Text Page:      [email or web address]      password [email or web address]  If 7PM-7AM, please contact night-coverage [email or web address] Password [email or web address] 08/09/2014, 4:25 PM   LOS: 3 days

## 2014-08-10 ENCOUNTER — Inpatient Hospital Stay (HOSPITAL_COMMUNITY): Payer: Managed Care, Other (non HMO)

## 2014-08-10 LAB — COMPREHENSIVE METABOLIC PANEL
ALT: 23 U/L (ref 0–35)
ANION GAP: 14 (ref 5–15)
AST: 26 U/L (ref 0–37)
Albumin: 2.3 g/dL — ABNORMAL LOW (ref 3.5–5.2)
Alkaline Phosphatase: 56 U/L (ref 39–117)
BUN: 7 mg/dL (ref 6–23)
CHLORIDE: 104 mmol/L (ref 96–112)
CO2: 24 mmol/L (ref 19–32)
Calcium: 7.4 mg/dL — ABNORMAL LOW (ref 8.4–10.5)
Creatinine, Ser: 0.85 mg/dL (ref 0.50–1.10)
GFR calc non Af Amer: >90 mL/min (ref >90)
Glucose, Bld: 105 mg/dL — ABNORMAL HIGH (ref 70–99)
POTASSIUM: 3.2 mmol/L — AB (ref 3.5–5.1)
Sodium: 142 mmol/L (ref 135–145)
Total Bilirubin: 0.7 mg/dL (ref 0.3–1.2)
Total Protein: 5.9 g/dL — ABNORMAL LOW (ref 6.0–8.3)

## 2014-08-10 LAB — TRIGLYCERIDES: TRIGLYCERIDES: 54 mg/dL (ref <150)

## 2014-08-10 LAB — CBC
HCT: 36.7 % (ref 36.0–46.0)
Hemoglobin: 12 g/dL (ref 12.0–15.0)
MCH: 30 pg (ref 26.0–34.0)
MCHC: 32.7 g/dL (ref 30.0–36.0)
MCV: 91.8 fL (ref 78.0–100.0)
Platelets: 247 10*3/uL (ref 150–400)
RBC: 4 MIL/uL (ref 3.87–5.11)
RDW: 13.3 % (ref 11.5–15.5)
WBC: 18.2 10*3/uL — ABNORMAL HIGH (ref 4.0–10.5)

## 2014-08-10 LAB — PHOSPHORUS: PHOSPHORUS: 1.1 mg/dL — AB (ref 2.3–4.6)

## 2014-08-10 LAB — LIPASE, BLOOD: LIPASE: 55 U/L (ref 11–59)

## 2014-08-10 LAB — MAGNESIUM: Magnesium: 2 mg/dL (ref 1.5–2.5)

## 2014-08-10 MED ORDER — POTASSIUM CHLORIDE 10 MEQ/100ML IV SOLN
10.0000 meq | INTRAVENOUS | Status: AC
  Administered 2014-08-10 (×3): 10 meq via INTRAVENOUS
  Filled 2014-08-10 (×3): qty 100

## 2014-08-10 MED ORDER — POTASSIUM PHOSPHATES 15 MMOLE/5ML IV SOLN
30.0000 mmol | Freq: Once | INTRAVENOUS | Status: AC
  Administered 2014-08-10: 30 mmol via INTRAVENOUS
  Filled 2014-08-10: qty 10

## 2014-08-10 MED ORDER — SODIUM CHLORIDE 0.9 % IJ SOLN
10.0000 mL | INTRAMUSCULAR | Status: DC | PRN
  Administered 2014-08-18 – 2014-08-22 (×7): 10 mL
  Filled 2014-08-10 (×7): qty 40

## 2014-08-10 MED ORDER — SODIUM CHLORIDE 0.9 % IJ SOLN
10.0000 mL | Freq: Two times a day (BID) | INTRAMUSCULAR | Status: DC
  Administered 2014-08-10 – 2014-08-11 (×3): 10 mL
  Administered 2014-08-12 – 2014-08-13 (×2): 20 mL
  Administered 2014-08-13 – 2014-08-14 (×2): 10 mL
  Administered 2014-08-14: 20 mL
  Administered 2014-08-15 – 2014-08-23 (×12): 10 mL

## 2014-08-10 NOTE — Progress Notes (Signed)
Peripherally Inserted Central Catheter/Midline Placement  The IV Nurse has discussed with the patient and/or persons authorized to consent for the patient, the purpose of this procedure and the potential benefits and risks involved with this procedure.  The benefits include less needle sticks, lab draws from the catheter and patient may be discharged home with the catheter.  Risks include, but not limited to, infection, bleeding, blood clot (thrombus formation), and puncture of an artery; nerve damage and irregular heat beat.  Alternatives to this procedure were also discussed.  PICC/Midline Placement Documentation  PICC / Midline Double Lumen 08/10/14 PICC Left Basilic 40 cm 2 cm (Active)  Indication for Insertion or Continuance of Line Administration of hyperosmolar/irritating solutions (i.e. TPN, Vancomycin, etc.) 08/10/2014  2:38 PM  Exposed Catheter (cm) 2 cm 08/10/2014  2:38 PM  Site Assessment Clean;Dry;Intact 08/10/2014  2:38 PM  Lumen #1 Status Flushed;Saline locked;Blood return noted 08/10/2014  2:38 PM  Lumen #2 Status Flushed;Saline locked;Blood return noted 08/10/2014  2:38 PM  Dressing Type Transparent 08/10/2014  2:38 PM  Dressing Status Clean;Dry;Intact;Antimicrobial disc in place 08/10/2014  2:38 PM  Dressing Intervention New dressing 08/10/2014  2:38 PM  Dressing Change Due 08/17/14 08/10/2014  2:38 PM       [name] Blanchard, [name] Blanchard Robert 08/10/2014, 2:41 PM

## 2014-08-10 NOTE — Progress Notes (Addendum)
Subjective: Feels better still with back pain  HIDA normal   Objective: Vital signs in last 24 hours: Temp:  [97.4 F (36.3 C)-99.4 F (37.4 C)] 98.2 F (36.8 C) (04/14 0400) Pulse Rate:  [121-133] 121 (04/14 0400) Resp:  [19-41] 23 (04/14 0422) BP: (128-152)/(80-104) 138/104 mmHg (04/14 0400) SpO2:  [94 %-98 %] 97 % (04/14 0422) Last BM Date: 08/04/14  Intake/Output from previous day: 04/13 0701 - 04/14 0700 In: 1725 [I.V.:1625; IV Piggyback:100] Out: 525 [Urine:525] Intake/Output this shift:    GI: abdomen tender epigastrium.  no peritonitis.    Lab Results:   Recent Labs  08/09/14 0326 08/10/14 0304  WBC 28.1* 18.2*  HGB 13.9 12.0  HCT 42.2 36.7  PLT 282 247   BMET  Recent Labs  08/09/14 0326 08/10/14 0304  NA 140 142  K 3.7 3.2*  CL 107 104  CO2 23 24  GLUCOSE 136* 105*  BUN 11 7  CREATININE 0.92 0.85  CALCIUM 6.6* 7.4*   PT/INR No results for input(s): LABPROT, INR in the last 72 hours. ABG No results for input(s): PHART, HCO3 in the last 72 hours.  Invalid input(s): PCO2, PO2  Studies/Results: Dg Abd 1 View  08/08/2014   CLINICAL DATA:  Abdominal pain, vomiting and constipation for 2 days. Acute pancreatitis.  EXAM: ABDOMEN - 1 VIEW  COMPARISON:  CT abdomen and pelvis and chest and two views abdomen.  FINDINGS: The bowel gas pattern is normal. No radio-opaque calculi or other significant radiographic abnormality are seen.  IMPRESSION: Negative exam.   Electronically Signed   By: Drusilla Kanner M.D.   On: 08/08/2014 13:14   Nm Hepatobiliary Liver Func  08/09/2014   CLINICAL DATA:  Gallstones.  Nausea vomiting and pain  EXAM: NUCLEAR MEDICINE HEPATOBILIARY IMAGING WITH GALLBLADDER EF  TECHNIQUE: Sequential images of the abdomen were obtained out to 60 minutes following intravenous administration of radiopharmaceutical. After slow intravenous infusion of 1.82 micrograms Cholecystokinin, gallbladder ejection fraction was determined.   RADIOPHARMACEUTICALS:  Five Millic[employer]ie Tc-76m Choletec  COMPARISON:  CT abdomen 08/06/2014  FINDINGS: Normal uptake by the gallbladder. Normal gallbladder ejection. Small bowel activity noted. 73% ejection fraction at 41 minutes. At 45 min, normal ejection fraction is greater than 40%.  IMPRESSION: Normal gallbladder uptake.  Normal gallbladder ejection fraction.   Electronically Signed   By: Marlan Palau M.D.   On: 08/09/2014 11:52   Dg Chest Port 1 View  08/10/2014   CLINICAL DATA:  Short of breath.  EXAM: PORTABLE CHEST - 1 VIEW  COMPARISON:  Abdomen CT, 08/06/2014  FINDINGS: There is new lung base opacity obscuring the hemidiaphragms and most of the cardiac silhouette. This is likely combination of pleural effusions and atelectasis. Pneumonia is possible. There is no evidence of pulmonary edema.  No pneumothorax.  IMPRESSION: 1. New bilateral lung base opacity most likely combination of pleural effusions and atelectasis. Consider pneumonia/pneumonitis in the proper clinical setting. No pulmonary edema.   Electronically Signed   By: Amie Portland M.D.   On: 08/10/2014 07:50    Anti-infectives: Anti-infectives    Start     Dose/Rate Route Frequency Ordered Stop   08/07/14 0400  piperacillin-tazobactam (ZOSYN) IVPB 3.375 g     3.375 g 12.5 mL/hr over 240 Minutes Intravenous Every 8 hours 08/06/14 1910     08/06/14 1930  piperacillin-tazobactam (ZOSYN) IVPB 3.375 g     3.375 g 12.5 mL/hr over 240 Minutes Intravenous  Once 08/06/14 1909 08/06/14 2340  Assessment/Plan: Gallstone pancreatitis some improvement Looks better  TNA Maybe lap chole early next week if she continues to improve Would keep NPO except ice chips for now    LOS: 4 days    Bianka Liberati A. 08/10/2014

## 2014-08-10 NOTE — Progress Notes (Signed)
PARENTERAL NUTRITION CONSULT NOTE - INITIAL  Pharmacy Consult for TPN Indication: Severe pancreatitis  No Known Allergies  Patient Measurements: Height:  (165.1 cm) Weight: 201 lb 11.5 oz (91.5 kg) IBW/kg (Calculated) : 57 Adjusted Body Weight: 65.6  Vital Signs: Temp: 98.2 F (36.8 C) (04/14 0806) Temp Source: Oral (04/14 0806) BP: 141/97 mmHg (04/14 0806) Pulse Rate: 125 (04/14 0806) Intake/Output from previous day: 04/13 0701 - 04/14 0700 In: 1725 [I.V.:1625; IV Piggyback:100] Out: 525 [Urine:525] Intake/Output from this shift: Total I/O In: -  Out: 525 [Urine:525]  Labs:  Recent Labs  08/08/14 0803 08/09/14 0326 08/10/14 0304  WBC 39.2* 28.1* 18.2*  HGB 16.5* 13.9 12.0  HCT 48.9* 42.2 36.7  PLT 291 282 247     Recent Labs  08/08/14 1209 08/09/14 0326 08/10/14 0304 08/10/14 0500  NA 141 140 142  --   K 4.2 3.7 3.2*  --   CL 108 107 104  --   CO2 --   GLUCOSE 159* 136* 105*  --   BUN --   CREATININE 1.04 0.92 0.85  --   CALCIUM 7.1* 6.6* 7.4*  --   MG  --   --  2.0  --   PHOS  --   --  1.1*  --   PROT 6.0 5.9* 5.9*  --   ALBUMIN 2.6* 2.3* 2.3*  --   AST 34 32 26  --   ALT --   ALKPHOS 41 54 56  --   BILITOT 0.8 0.8 0.7  --   TRIG 39  --   --  54   Estimated Creatinine Clearance: 116 mL/min (by C-G formula based on Cr of 0.85).   No results for input(s): GLUCAP in the last 72 hours.  Medical History: Past Medical History  Diagnosis Date  . Anxiety     diagnosed in 2012  . Contraceptive use     Had nexplanon - removed, now on OCP  . Dysmenorrhea    Insulin Requirements in the past 24 hours:  None  Current Nutrition:  NPO  Nutritional Goals:  Kcal: 2000-2200 Protein: 100-110 gm Fluid: 2.0-2.2 L  Assessment: 24 yo F presents on 4/10 with severe abdominal pain. Initial lipase over 37,000 with mildly elevated LFTs. CT showed a stone in the gall bladder.  GI: Albumin 2.3. Pt feels better today,  but still has some back pain. Surgery considering lap chole early next week if pt continues to improve. Will keep NPO for now except ice chips. Talked with surgery today and Dr. Luisa Hart stated would like to continue with TPN and not trial post-pyloric TFs.   Endo: CBGs ok 100-130s.  Lytes: K low at 3.2, CoCa 8.8, Phos low at 1.1, Mg 2.0   Renal: SCr 0.85, CrCl > 128ml/min. UOP yesterday was . Net positive 1 L yesterday  Pulm: 3L of [place]  Cards: BP slightly elevated, Hr tachy  Hepatobil: LFTs back to wnl, t bili 0.7, Trig 54.  Neuro: Pain score 4-9. Dilaudid PCA  ID:   Day #4 of zosyn for SIRS/sepsis. Afebrile, WBC elevated but decreasing to 18.2 from 30.9  Best Practices: Lovenox, PPI   TPN Access: Plan for PICC line  TPN start date: 4/14 >>  Plan:  - Hold TPN today due to low electrolytes. Plan to start tomorrow per discussion with surgery - Give 30 mmol of Potassium phosphate IV x 1 - F/U start  of TPN, TPN labs  Linsi Humann J 08/10/2014,10:20 AM

## 2014-08-10 NOTE — Progress Notes (Signed)
INITIAL NUTRITION ASSESSMENT  DOCUMENTATION CODES Per approved criteria  -Obesity Unspecified   INTERVENTION:  TPN per pharmacy RD to follow for nutrition care plan  NUTRITION DIAGNOSIS: Inadequate oral intake related to altered GI function as evidenced by NPO status  Goal: Pt to meet >/= 90% of their estimated nutrition needs   Monitor:  TPN prescription, PO diet advancement, weight, labs, I/O's  Reason for Assessment: TPN  24 y.o. female   Admitting Dx: Pancreatitis, acute  ASSESSMENT: 24 y/o Female with with chronic intermittent abdominal pains for 6 month admitted with acute pancreatitis. Patient was transferred from [facility] ER where lipase was reportedly over 37,000, w/ mildly elevated lfts, nml alk phos and tbili; CT showing a stone in the gallbladder w/ GB wall thickening + edema around the pancreas w/ cbd dilation but with no stone.  RD consulted for new TPN for severe gallstone pancreatitis.  PICC line placement pending.  No TPN orders in place at this time.  RD spoke with patient's Mother who reports pt has not taken anything by mouth since Saturday's dinner.  + vomiting.  Mother also states pt was trying to lose some weight PTA, however, no unintentional wt loss.   Surgery note reviewed.  Possible lap chole early next week if she continues to improve.  No muscle or subcutaneous fat depletion noticed.  Height: Ht Readings from Last 1 Encounters:  08/08/14 5' 5"  (1.651 m)    Weight: Wt Readings from Last 1 Encounters:  08/08/14 201 lb 11.5 oz (91.5 kg)    Ideal Body Weight: 125 lb  % Ideal Body Weight: 160%  Wt Readings from Last 10 Encounters:  08/08/14 201 lb 11.5 oz (91.5 kg)  08/26/11 160 lb (72.576 kg) (87 %*, Z = 1.14)   * Growth percentiles are based on CDC 2-20 Years data.    Usual Body Weight: unable to obtain  % Usual Body Weight: ---  BMI:  Body mass index is 33.57 kg/(m^2).  Estimated Nutritional Needs: Kcal:  2000-2200 Protein: 100-110 gm Fluid: 2.0-2.2 L  Skin: Intact  Diet Order: Diet NPO time specified  EDUCATION NEEDS: -No education needs identified at this time   Intake/Output Summary (Last 24 hours) at 08/10/14 1045 Last data filed at 08/10/14 0818  Gross per 24 hour  Intake   1725 ml  Output   1050 ml  Net    675 ml    Labs:   Recent Labs Lab 08/06/14 2007  08/08/14 1209 08/09/14 0326 08/10/14 0304  NA 138  < > 141 140 142  K 4.7  < > 4.2 3.7 3.2*  CL 107  < > 108 107 104  CO2 20  < > 21 23 24   BUN 6  < > 14 11 7   CREATININE 0.86  < > 1.04 0.92 0.85  CALCIUM 8.0*  < > 7.1* 6.6* 7.4*  MG 1.6  --   --   --  2.0  PHOS 3.5  --   --   --  1.1*  GLUCOSE 155*  < > 159* 136* 105*  < > = values in this interval not displayed.   Scheduled Meds: . antiseptic oral rinse  7 mL Mouth Rinse q12n4p  . chlorhexidine  15 mL Mouth Rinse BID  . enoxaparin (LOVENOX) injection  40 mg Subcutaneous Q24H  . HYDROmorphone PCA 0.3 mg/mL   Intravenous 6 times per day  . pantoprazole (PROTONIX) IV  40 mg Intravenous Q12H  . piperacillin-tazobactam (ZOSYN)  IV  3.375 g Intravenous Q8H  . sodium chloride  3 mL Intravenous Q12H    Continuous Infusions: . sodium chloride 125 mL/hr at 08/09/14 1819    Past Medical History  Diagnosis Date  . Anxiety     diagnosed in 2012  . Contraceptive use     Had nexplanon - removed, now on OCP  . Dysmenorrhea     Past Surgical History  Procedure Laterality Date  . Knee surgery Right 09/10/2010    Dr. Berenice Primas  . Tonsillectomy      24 y/o  . Adenoidectomy      24y/o  . Wisdom tooth extraction      unsure if 2 or 4 teeth    Arthur Holms, RD, LDN Pager #: [phone] After-Hours Pager #: [phone]

## 2014-08-10 NOTE — Progress Notes (Signed)
[place] TEAM 1 - Stepdown/ICU TEAM Progress Note  [name] Blanchard YYT:[id number] DOB: 1990/10/02 DOA: 08/06/2014 PCP: No PCP Per Patient  Admit HPI / Brief Narrative: 24 y/o female with with chronic intermittent abdominal pain for 6 months admitted with acute pancreatitis.  Patient was transferred from [facility] ER where lipase was reportedly over 37,000, w/ mildly elevated lfts, nml alk phos and tbili; and a CT showing a stone in the gallbladder w/ GB wall thickening + edema around the pancreas w/ cbd dilation but with no stone.   HPI/Subjective: The patient is much more comfortable today on her PCA.  She additionally states that her nausea/vomiting is better controlled.  She denies shortness of breath or headache.   Assessment/Plan:  Acute severe gallstone pancreatitis Lipase has been fluctuating, but has normalized today - Gen Surg following - will require cholecystectomy prior to d/c once more stable - continue PCA and follow   SIRS in the setting of acute pancreatitis SIRS physiology persists - PCXR this morning without evidence of ARDS - hemodynamically stable   Hypokalemia Due to no intake - to begin TNA today  Hypophosphatemia Due to no intake - to begin TNA today  Nutrition Will need prolonged bowel rest, so PICC placed - to begin TNA  Obesity - Body mass index is 33.57 kg/(m^2).  Dysmenorrhea on menstrual cycle  Code Status: FULL Family Communication: spoke w/ mother at length at bedside  Disposition Plan: SDU   Consultants: Gen Surgery   Procedures: HIDA - 4/13 - unremarkable   Antibiotics: Zosyn 4/10 >  DVT prophylaxis: SCDs  Objective: Blood pressure 142/98, pulse 125, temperature 98.2 F (36.8 C), temperature source Oral, resp. rate 21, height _0  (1.651 m), weight 91.5 kg (201 lb 11.5 oz), last menstrual period 08/06/2014, SpO2 95 %.  Intake/Output Summary (Last 24 hours) at 08/10/14 1115 Last data filed at 08/10/14 0818  Gross per 24 hour    Intake   1725 ml  Output   1050 ml  Net    675 ml   Exam: General: No acute respiratory distress Lungs: Clear to auscultation bilaterally without wheezes or crackles Cardiovascular: tachycardic but regular - no gallup or rub or murmur Abdomen: Obese, very tender diffusely, soft, bs absent, no appreciable mass w/ gently palpation  Extremities: No significant cyanosis, or clubbing;  1+ edema bilateral lower extremities  Data Reviewed: Basic Metabolic Panel:  Recent Labs Lab 08/06/14 2007 08/07/14 0835 08/08/14 1209 08/09/14 0326 08/10/14 0304  NA 138 139 141 140 142  K 4.7 4.5 4.2 3.7 3.2*  CL 107 106 108 107 104  CO2 _1 GLUCOSE 155* 121* 159* 136* 105*  BUN _2 CREATININE 0.86 0.92 1.04 0.92 0.85  CALCIUM 8.0* 7.9* 7.1* 6.6* 7.4*  MG 1.6  --   --   --  2.0  PHOS 3.5  --   --   --  1.1*    Liver Function Tests:  Recent Labs Lab 08/06/14 2007 08/07/14 0835 08/08/14 1209 08/09/14 0326 08/10/14 0304  AST 45* 37 34 32 26  ALT 55* 42* _3 ALKPHOS 60 52 41 54 56  BILITOT 0.9 0.8 0.8 0.8 0.7  PROT 7.1 6.9 6.0 5.9* 5.9*  ALBUMIN 3.4* 3.1* 2.6* 2.3* 2.3*    Recent Labs Lab 08/06/14 2007 08/07/14 0835 08/08/14 1209 08/09/14 0326 08/10/14 0304  LIPASE 400* 795* 576* 183* 55   Coags:  Recent Labs Lab 08/06/14 2007  INR 0.99    Recent Labs Lab 08/06/14 2007  APTT 22*    CBC:  Recent Labs Lab 08/06/14 2007 08/07/14 0835 08/08/14 0803 08/09/14 0326 08/10/14 0304  WBC 21.8* 30.9* 39.2* 28.1* 18.2*  NEUTROABS 20.1*  --   --   --   --   HGB 16.4* 17.4* 16.5* 13.9 12.0  HCT 47.9* 52.1* 48.9* 42.2 36.7  MCV 90.0 92.4 92.4 92.5 91.8  PLT 284 368 291 282 247    Recent Results (from the past 240 hour(s))  MRSA PCR Screening     Status: None   Collection Time: 08/08/14  4:00 PM  Result Value Ref Range Status   MRSA by PCR NEGATIVE NEGATIVE Final    Comment:        The GeneXpert MRSA Assay (FDA approved for NASAL  specimens only), is one component of a comprehensive MRSA colonization surveillance program. It is not intended to diagnose MRSA infection nor to guide or monitor treatment for MRSA infections.      Studies:   Recent x-ray studies have been reviewed in detail by the Attending Physician  Scheduled Meds:  Scheduled Meds: . antiseptic oral rinse  7 mL Mouth Rinse q12n4p  . chlorhexidine  15 mL Mouth Rinse BID  . enoxaparin (LOVENOX) injection  40 mg Subcutaneous Q24H  . HYDROmorphone PCA 0.3 mg/mL   Intravenous 6 times per day  . pantoprazole (PROTONIX) IV  40 mg Intravenous Q12H  . piperacillin-tazobactam (ZOSYN)  IV  3.375 g Intravenous Q8H  . sodium chloride  3 mL Intravenous Q12H    Time spent on care of this patient: 35 mins   Quientin Jent T , MD   [facility] Office  [phone] Pager - Text Page per Shea Evans as per below:  On-Call/Text Page:      [email or web address]      password [email or web address]  If 7PM-7AM, please contact night-coverage [email or web address] Password [email or web address] 08/10/2014, 11:15 AM   LOS: 4 days

## 2014-08-11 LAB — PREALBUMIN: Prealbumin: 7 mg/dL — ABNORMAL LOW (ref 17–34)

## 2014-08-11 LAB — BASIC METABOLIC PANEL
ANION GAP: 9 (ref 5–15)
Anion gap: 9 (ref 5–15)
BUN: 8 mg/dL (ref 6–23)
BUN: 7 mg/dL (ref 6–23)
CALCIUM: 7.8 mg/dL — AB (ref 8.4–10.5)
CHLORIDE: 100 mmol/L (ref 96–112)
CHLORIDE: 101 mmol/L (ref 96–112)
CO2: 28 mmol/L (ref 19–32)
CO2: 28 mmol/L (ref 19–32)
CREATININE: 0.59 mg/dL (ref 0.50–1.10)
Calcium: 7.7 mg/dL — ABNORMAL LOW (ref 8.4–10.5)
Creatinine, Ser: 0.68 mg/dL (ref 0.50–1.10)
GFR calc Af Amer: >90 mL/min (ref >90)
GFR calc Af Amer: >90 mL/min (ref >90)
GFR calc non Af Amer: >90 mL/min (ref >90)
GFR calc non Af Amer: >90 mL/min (ref >90)
GLUCOSE: 128 mg/dL — AB (ref 70–99)
Glucose, Bld: 118 mg/dL — ABNORMAL HIGH (ref 70–99)
Potassium: 3.2 mmol/L — ABNORMAL LOW (ref 3.5–5.1)
Potassium: 3.4 mmol/L — ABNORMAL LOW (ref 3.5–5.1)
Sodium: 137 mmol/L (ref 135–145)
Sodium: 138 mmol/L (ref 135–145)

## 2014-08-11 LAB — MAGNESIUM
MAGNESIUM: 2.3 mg/dL (ref 1.5–2.5)
Magnesium: 1.8 mg/dL (ref 1.5–2.5)

## 2014-08-11 LAB — PHOSPHORUS
Phosphorus: 1.3 mg/dL — ABNORMAL LOW (ref 2.3–4.6)
Phosphorus: 1.7 mg/dL — ABNORMAL LOW (ref 2.3–4.6)

## 2014-08-11 LAB — GLUCOSE, CAPILLARY
GLUCOSE-CAPILLARY: 85 mg/dL (ref 70–99)
Glucose-Capillary: 112 mg/dL — ABNORMAL HIGH (ref 70–99)
Glucose-Capillary: 110 mg/dL — ABNORMAL HIGH (ref 70–99)

## 2014-08-11 MED ORDER — POTASSIUM PHOSPHATES 15 MMOLE/5ML IV SOLN
30.0000 mmol | Freq: Once | INTRAVENOUS | Status: AC
  Administered 2014-08-11: 30 mmol via INTRAVENOUS
  Filled 2014-08-11: qty 10

## 2014-08-11 MED ORDER — POTASSIUM CHLORIDE 10 MEQ/50ML IV SOLN: 10.0000 meq | INTRAVENOUS | Status: AC

## 2014-08-11 MED ORDER — SODIUM CHLORIDE 0.9 % IV SOLN
INTRAVENOUS | Status: AC
  Administered 2014-08-11 (×2): via INTRAVENOUS

## 2014-08-11 MED ORDER — INSULIN ASPART 100 UNIT/ML ~~LOC~~ SOLN
0.0000 [IU] | Freq: Four times a day (QID) | SUBCUTANEOUS | Status: DC
  Administered 2014-08-11 – 2014-08-12 (×4): 1 [IU] via SUBCUTANEOUS
  Administered 2014-08-13 (×2): 2 [IU] via SUBCUTANEOUS
  Administered 2014-08-13: 1 [IU] via SUBCUTANEOUS
  Administered 2014-08-13 – 2014-08-14 (×4): 2 [IU] via SUBCUTANEOUS

## 2014-08-11 MED ORDER — POTASSIUM PHOSPHATES 15 MMOLE/5ML IV SOLN
20.0000 mmol | Freq: Once | INTRAVENOUS | Status: AC
  Administered 2014-08-11: 20 mmol via INTRAVENOUS
  Filled 2014-08-11: qty 6.67

## 2014-08-11 MED ORDER — FAT EMULSION 20 % IV EMUL
120.0000 mL | INTRAVENOUS | Status: AC
Start: 2014-08-11 — End: 2014-08-12
  Administered 2014-08-11: 120 mL via INTRAVENOUS
  Filled 2014-08-11: qty 250

## 2014-08-11 MED ORDER — POTASSIUM CHLORIDE 10 MEQ/50ML IV SOLN
10.0000 meq | INTRAVENOUS | Status: AC
  Administered 2014-08-11 (×2): 10 meq via INTRAVENOUS
  Filled 2014-08-11 (×2): qty 50

## 2014-08-11 MED ORDER — TRACE MINERALS CR-CU-F-FE-I-MN-MO-SE-ZN IV SOLN
INTRAVENOUS | Status: AC
  Administered 2014-08-11: 18:00:00 via INTRAVENOUS
  Filled 2014-08-11: qty 720

## 2014-08-11 MED ORDER — INSULIN ASPART 100 UNIT/ML ~~LOC~~ SOLN: 0.0000 [IU] | Freq: Four times a day (QID) | SUBCUTANEOUS | Status: DC

## 2014-08-11 MED ORDER — DEXTROSE 5 % IV SOLN
3.0000 g | Freq: Once | INTRAVENOUS | Status: AC
  Administered 2014-08-11: 3 g via INTRAVENOUS
  Filled 2014-08-11: qty 6

## 2014-08-11 NOTE — Progress Notes (Signed)
Patient ID: [name] Blanchard, female   DOB: December 06, 1990, 24 y.o.   MRN: [id number]     [address] [place], [place] [address]    Phone: [phone] FAX: [phone]     Subjective: Improved pain. Intermittent nausea.  Still having loose stools. VSS.  Afebrile.  Labs reviewed.   Objective:  Vital signs:  Filed Vitals:   08/11/14 0200 08/11/14 0357 08/11/14 0400 08/11/14 0731  BP: 140/90  142/90 141/90  Pulse:  124  112  Temp:  97.8 F (36.6 C)  97.8 F (36.6 C)  TempSrc:  Oral  Oral  Resp: 18 24 19 21   Height:      Weight:      SpO2:   99% 98%    Last BM Date: 08/10/14  Intake/Output   Yesterday:  04/14 0701 - 04/15 0700 In: 6720 [I.V.:1375; IV Piggyback:100] Out: 975 [Urine:975] This shift:    I/O last 3 completed shifts: In: 3075 [I.V.:2875; IV Piggyback:200] Out: 1300 [Urine:1300]    Physical Exam: General: Pt awake/alert/oriented x4 in no acute distress Abdomen: Soft.  +Bs.  distended.  TTP to upper abdomen.  No evidence of peritonitis.  No incarcerated hernias.    Problem List:   Principal Problem:   Pancreatitis, acute Active Problems:   Abdominal pain   Gallstones   Common bile duct dilatation   Leukocytosis   Acute pancreatitis    Results:   Labs: Results for orders placed or performed during the hospital encounter of 08/06/14 (from the past 48 hour(s))  Comprehensive metabolic panel     Status: Abnormal   Collection Time: 08/10/14  3:04 AM  Result Value Ref Range   Sodium 142 135 - 145 mmol/L   Potassium 3.2 (L) 3.5 - 5.1 mmol/L   Chloride 104 96 - 112 mmol/L   CO2 24 19 - 32 mmol/L   Glucose, Bld 105 (H) 70 - 99 mg/dL   BUN 7 6 - 23 mg/dL   Creatinine, Ser 0.85 0.50 - 1.10 mg/dL   Calcium 7.4 (L) 8.4 - 10.5 mg/dL   Total Protein 5.9 (L) 6.0 - 8.3 g/dL   Albumin 2.3 (L) 3.5 - 5.2 g/dL   AST 26 0 - 37 U/L   ALT 23 0 - 35 U/L   Alkaline Phosphatase 56 39 - 117 U/L   Total Bilirubin 0.7 0.3 - 1.2 mg/dL   GFR calc non Af Amer >90 >90 mL/min   GFR calc Af Amer >90 >90 mL/min    Comment: (NOTE) The eGFR has been calculated using the CKD EPI equation. This calculation has not been validated in all clinical situations. eGFR's persistently <90 mL/min signify possible Chronic Kidney Disease.    Anion gap 14 5 - 15  CBC     Status: Abnormal   Collection Time: 08/10/14  3:04 AM  Result Value Ref Range   WBC 18.2 (H) 4.0 - 10.5 K/uL   RBC 4.00 3.87 - 5.11 MIL/uL   Hemoglobin 12.0 12.0 - 15.0 g/dL   HCT 36.7 36.0 - 46.0 %   MCV 91.8 78.0 - 100.0 fL   MCH 30.0 26.0 - 34.0 pg   MCHC 32.7 30.0 - 36.0 g/dL   RDW 13.3 11.5 - 15.5 %   Platelets 247 150 - 400 K/uL  Lipase, blood     Status: None   Collection Time: 08/10/14  3:04 AM  Result Value Ref Range  Lipase 55 11 - 59 U/L  Magnesium     Status: None   Collection Time: 08/10/14  3:04 AM  Result Value Ref Range   Magnesium 2.0 1.5 - 2.5 mg/dL  Phosphorus     Status: Abnormal   Collection Time: 08/10/14  3:04 AM  Result Value Ref Range   Phosphorus 1.1 (L) 2.3 - 4.6 mg/dL  Prealbumin     Status: Abnormal   Collection Time: 08/10/14  3:04 AM  Result Value Ref Range   Prealbumin 7 (L) 17 - 34 mg/dL    Comment: ** Please note change in reference range(s). ** Performed at [facility]   Triglycerides     Status: None   Collection Time: 08/10/14  5:00 AM  Result Value Ref Range   Triglycerides 54 <150 mg/dL  Magnesium     Status: None   Collection Time: 08/11/14  5:00 AM  Result Value Ref Range   Magnesium 1.8 1.5 - 2.5 mg/dL  Phosphorus     Status: Abnormal   Collection Time: 08/11/14  5:00 AM  Result Value Ref Range   Phosphorus 1.3 (L) 2.3 - 4.6 mg/dL  Basic metabolic panel     Status: Abnormal   Collection Time: 08/11/14  5:00 AM  Result Value Ref Range   Sodium 137 135 - 145 mmol/L   Potassium 3.2 (L) 3.5 - 5.1 mmol/L   Chloride 100 96 - 112 mmol/L   CO2 28 19 - 32 mmol/L    Glucose, Bld 118 (H) 70 - 99 mg/dL   BUN 8 6 - 23 mg/dL   Creatinine, Ser 0.68 0.50 - 1.10 mg/dL   Calcium 7.7 (L) 8.4 - 10.5 mg/dL   GFR calc non Af Amer >90 >90 mL/min   GFR calc Af Amer >90 >90 mL/min    Comment: (NOTE) The eGFR has been calculated using the CKD EPI equation. This calculation has not been validated in all clinical situations. eGFR's persistently <90 mL/min signify possible Chronic Kidney Disease.    Anion gap 9 5 - 15  Glucose, capillary     Status: None   Collection Time: 08/11/14  7:56 AM  Result Value Ref Range   Glucose-Capillary 85 70 - 99 mg/dL    Imaging / Studies: Nm Hepatobiliary Liver Func  08/09/2014   CLINICAL DATA:  Gallstones.  Nausea vomiting and pain  EXAM: NUCLEAR MEDICINE HEPATOBILIARY IMAGING WITH GALLBLADDER EF  TECHNIQUE: Sequential images of the abdomen were obtained out to 60 minutes following intravenous administration of radiopharmaceutical. After slow intravenous infusion of 1.82 micrograms Cholecystokinin, gallbladder ejection fraction was determined.  RADIOPHARMACEUTICALS:  [employer] [place] GY-69S Choletec  COMPARISON:  CT abdomen 08/06/2014  FINDINGS: Normal uptake by the gallbladder. Normal gallbladder ejection. Small bowel activity noted. 73% ejection fraction at 41 minutes. At 45 min, normal ejection fraction is greater than 40%.  IMPRESSION: Normal gallbladder uptake.  Normal gallbladder ejection fraction.   Electronically Signed   By: Franchot Gallo M.D.   On: 08/09/2014 11:52   Dg Chest Port 1 View  08/10/2014   CLINICAL DATA:  Short of breath.  EXAM: PORTABLE CHEST - 1 VIEW  COMPARISON:  Abdomen CT, 08/06/2014  FINDINGS: There is new lung base opacity obscuring the hemidiaphragms and most of the cardiac silhouette. This is likely combination of pleural effusions and atelectasis. Pneumonia is possible. There is no evidence of pulmonary edema.  No pneumothorax.  IMPRESSION: 1. New bilateral lung base opacity most likely combination of  pleural effusions  and atelectasis. Consider pneumonia/pneumonitis in the proper clinical setting. No pulmonary edema.   Electronically Signed   By: Lajean Manes M.D.   On: 08/10/2014 07:50    Medications / Allergies:  Scheduled Meds: . antiseptic oral rinse  7 mL Mouth Rinse q12n4p  . chlorhexidine  15 mL Mouth Rinse BID  . enoxaparin (LOVENOX) injection  40 mg Subcutaneous Q24H  . HYDROmorphone PCA 0.3 mg/mL   Intravenous 6 times per day  . insulin aspart  0-9 Units Subcutaneous 4 times per day  . magnesium sulfate 1 - 4 g bolus IVPB  3 g Intravenous Once  . pantoprazole (PROTONIX) IV  40 mg Intravenous Q12H  . piperacillin-tazobactam (ZOSYN)  IV  3.375 g Intravenous Q8H  . potassium chloride  10 mEq Intravenous Q1 Hr x 2  . potassium phosphate IVPB (mmol)  30 mmol Intravenous Once  . sodium chloride  10-40 mL Intracatheter Q12H  . sodium chloride  3 mL Intravenous Q12H   Continuous Infusions: . sodium chloride 125 mL/hr at 08/10/14 1525  . sodium chloride    . Marland KitchenTPN (CLINIMIX-E) Adult     And  . fat emulsion     PRN Meds:.bisacodyl, diphenhydrAMINE **OR** diphenhydrAMINE, naloxone **AND** sodium chloride, ondansetron (ZOFRAN) IV, promethazine, sodium chloride  Antibiotics: Anti-infectives    Start     Dose/Rate Route Frequency Ordered Stop   08/07/14 0400  piperacillin-tazobactam (ZOSYN) IVPB 3.375 g     3.375 g 12.5 mL/hr over 240 Minutes Intravenous Every 8 hours 08/06/14 1910     08/06/14 1930  piperacillin-tazobactam (ZOSYN) IVPB 3.375 g     3.375 g 12.5 mL/hr over 240 Minutes Intravenous  Once 08/06/14 1909 08/06/14 2340         Assessment/Plan HD #5 Severe acute gallstone pancreatitis (transfer from [facility] ER) with likely acute cholecystitis -CT shows single 10m gallstone, GB wall thickening, pericholecystic fluid, enlarged pancreas with peri-pancreatic edema.  Follow up HIDA was negative -lipase 4/13 normal -await improvement in abdominal pain before  proceeding with a cholecystectomy, likely early next week.   -NPO -IVF, pain control, antiemetics -On Zosyn 08/06/14 Day #5 ---> -TPN Leukocytosis improving, on zosyn  Transaminitis -Improving Obesity - BMI 35 Severe PCM--prealbumin 7 -TPN   Beyonce Sawatzky, ANP-BC [facility] Pager [phone](7A-4:30P) For consults and floor pages call [phone](7A-4:30P)  08/11/2014 8:34 AM

## 2014-08-11 NOTE — Progress Notes (Signed)
[place] TEAM 1 - Stepdown/ICU TEAM Progress Note  [name] Blanchard XQJ:[id number] DOB: 06/01/1990 DOA: 08/06/2014 PCP: No PCP Per Patient  Admit HPI / Brief Narrative: 24 y/o female with with chronic intermittent abdominal pain for 6 months admitted with acute pancreatitis.  Patient was transferred from [facility] ER where lipase was reportedly over 37,000, w/ mildly elevated lfts, nml alk phos and tbili; and a CT showing a stone in the gallbladder w/ GB wall thickening + edema around the pancreas w/ cbd dilation but with no stone.   HPI/Subjective: Patient reports she feels much better again today and is noting continuous improvement.  She has no chest pain shortness of breath or vomiting today.  Assessment/Plan:  Acute severe gallstone pancreatitis Lipase had been fluctuating, but has normalized - Gen Surg following - will require cholecystectomy prior to d/c once more stable - continue PCA and follow - appears to be improving at a rapid rate presently  SIRS in the setting of acute pancreatitis SIRS physiology persists but is significantly improved - hemodynamically stable   Hypokalemia Due to no intake - TNA today  Hypophosphatemia Due to no intake - TNA today  Nutrition Will need prolonged bowel rest, so PICC placed - TNA to begin today  Obesity - Body mass index is 33.57 kg/(m^2).   Code Status: FULL Family Communication: spoke w/ mother at bedside  Disposition Plan: SDU   Consultants: Gen Surgery   Procedures: HIDA - 4/13 - unremarkable   Antibiotics: Zosyn 4/10 >  DVT prophylaxis: SCDs  Objective: Blood pressure 145/94, pulse 112, temperature 97.7 F (36.5 C), temperature source Oral, resp. rate 22, height 5' 5"  (1.651 m), weight 91.5 kg (201 lb 11.5 oz), last menstrual period 08/06/2014, SpO2 98 %.  Intake/Output Summary (Last 24 hours) at 08/11/14 1437 Last data filed at 08/11/14 0600  Gross per 24 hour  Intake   1475 ml  Output    450 ml  Net   1025  ml   Exam: General: No acute respiratory distress Lungs: Clear to auscultation bilaterally without wheezes or crackles Cardiovascular: tachycardic but regular - no gallup or rub or murmur Abdomen: Obese, tender diffusely, soft, bs absent, no appreciable mass w/ gently palpation  Extremities: No significant cyanosis, or clubbing;  1+ edema bilateral lower extremities  Data Reviewed: Basic Metabolic Panel:  Recent Labs Lab 08/06/14 2007 08/07/14 0835 08/08/14 1209 08/09/14 0326 08/10/14 0304 08/11/14 0500  NA 138 139 141 140 142 137  K 4.7 4.5 4.2 3.7 3.2* 3.2*  CL 107 106 108 107 104 100  CO2 20 19 21 23 24 28   GLUCOSE 155* 121* 159* 136* 105* 118*  BUN 6 9 14 11 7 8   CREATININE 0.86 0.92 1.04 0.92 0.85 0.68  CALCIUM 8.0* 7.9* 7.1* 6.6* 7.4* 7.7*  MG 1.6  --   --   --  2.0 1.8  PHOS 3.5  --   --   --  1.1* 1.3*    Liver Function Tests:  Recent Labs Lab 08/06/14 2007 08/07/14 0835 08/08/14 1209 08/09/14 0326 08/10/14 0304  AST 45* 37 34 32 26  ALT 55* 42* 29 28 23   ALKPHOS 60 52 41 54 56  BILITOT 0.9 0.8 0.8 0.8 0.7  PROT 7.1 6.9 6.0 5.9* 5.9*  ALBUMIN 3.4* 3.1* 2.6* 2.3* 2.3*    Recent Labs Lab 08/06/14 2007 08/07/14 0835 08/08/14 1209 08/09/14 0326 08/10/14 0304  LIPASE 400* 795* 576* 183* 55   Coags:  Recent Labs Lab 08/06/14  2007  INR 0.99    Recent Labs Lab 08/06/14 2007  APTT 22*    CBC:  Recent Labs Lab 08/06/14 2007 08/07/14 0835 08/08/14 0803 08/09/14 0326 08/10/14 0304  WBC 21.8* 30.9* 39.2* 28.1* 18.2*  NEUTROABS 20.1*  --   --   --   --   HGB 16.4* 17.4* 16.5* 13.9 12.0  HCT 47.9* 52.1* 48.9* 42.2 36.7  MCV 90.0 92.4 92.4 92.5 91.8  PLT 284 368 291 282 247    Recent Results (from the past 240 hour(s))  MRSA PCR Screening     Status: None   Collection Time: 08/08/14  4:00 PM  Result Value Ref Range Status   MRSA by PCR NEGATIVE NEGATIVE Final    Comment:        The GeneXpert MRSA Assay (FDA approved for NASAL  specimens only), is one component of a comprehensive MRSA colonization surveillance program. It is not intended to diagnose MRSA infection nor to guide or monitor treatment for MRSA infections.      Studies:   Recent x-ray studies have been reviewed in detail by the Attending Physician  Scheduled Meds:  Scheduled Meds: . antiseptic oral rinse  7 mL Mouth Rinse q12n4p  . chlorhexidine  15 mL Mouth Rinse BID  . enoxaparin (LOVENOX) injection  40 mg Subcutaneous Q24H  . HYDROmorphone PCA 0.3 mg/mL   Intravenous 6 times per day  . insulin aspart  0-9 Units Subcutaneous 4 times per day  . pantoprazole (PROTONIX) IV  40 mg Intravenous Q12H  . piperacillin-tazobactam (ZOSYN)  IV  3.375 g Intravenous Q8H  . potassium phosphate IVPB (mmol)  30 mmol Intravenous Once  . sodium chloride  10-40 mL Intracatheter Q12H  . sodium chloride  3 mL Intravenous Q12H    Time spent on care of this patient: 25 mins   Keane Martelli T , MD   [facility] Office  [phone] Pager - Text Page per Shea Evans as per below:  On-Call/Text Page:      [email or web address]      password [email or web address]  If 7PM-7AM, please contact night-coverage [email or web address] Password [facility] 08/11/2014, 2:37 PM   LOS: 5 days

## 2014-08-11 NOTE — Progress Notes (Addendum)
PARENTERAL NUTRITION CONSULT NOTE - FOLLOW UP  Pharmacy Consult:  TPN Indication: Severe pancreatitis  No Known Allergies  Patient Measurements: Height: 5\' 5"  (165.1 cm) Weight: 201 lb 11.5 oz (91.5 kg) IBW/kg (Calculated) : 57 Adjusted Body Weight: 66 kg  Vital Signs: Temp: 97.8 F (36.6 C) (04/15 0731) Temp Source: Oral (04/15 0731) BP: 141/90 mmHg (04/15 0731) Pulse Rate: 112 (04/15 0731) Intake/Output from previous day: 04/14 0701 - 04/15 0700 In: 1475 [I.V.:1375; IV Piggyback:100] Out: 975 [Urine:975]  Labs:  Recent Labs  08/08/14 0803 08/09/14 0326 08/10/14 0304  WBC 39.2* 28.1* 18.2*  HGB 16.5* 13.9 12.0  HCT 48.9* 42.2 36.7  PLT 291 282 247     Recent Labs  08/08/14 1209 08/09/14 0326 08/10/14 0304 08/10/14 0500 08/11/14 0500  NA 141 140 142  --  137  K 4.2 3.7 3.2*  --  3.2*  CL 108 107 104  --  100  CO2 21 23 24   --  28  GLUCOSE 159* 136* 105*  --  118*  BUN 14 11 7   --  8  CREATININE 1.04 0.92 0.85  --  0.68  CALCIUM 7.1* 6.6* 7.4*  --  7.7*  MG  --   --  2.0  --  1.8  PHOS  --   --  1.1*  --  1.3*  PROT 6.0 5.9* 5.9*  --   --   ALBUMIN 2.6* 2.3* 2.3*  --   --   AST 34 32 26  --   --   ALT 29 28 23   --   --   ALKPHOS 41 54 56  --   --   BILITOT 0.8 0.8 0.7  --   --   PREALBUMIN  --   --  7*  --   --   TRIG 39  --   --  54  --    Estimated Creatinine Clearance: 123.3 mL/min (by C-G formula based on Cr of 0.68).   No results for input(s): GLUCAP in the last 72 hours.     Insulin Requirements in the past 24 hours:  None  Current Nutrition:  NPO  Nutritional Goals:  2000-2200 kCal, 100-110 gm of protein per day   Assessment: [name] Blanchard presents on 08/06/14 with severe abdominal pain.  Initial lipase was over 37,000 with mildly elevated LFTs.  CT showed a stone in the gall bladder.  Surgery would like to start TPN instead of a trial of post-pyloric trickle feed for severe pancreatitis.  Noted patient was trying to lose weight PTA,  limited to no PO intake since 08/05/14, and baseline electrolytes are low.  Concerned with refeeding; thus, TPN initiation was delayed to supplement electrolytes.  Surgeries/Procedures: possible lap chole early next week GI: baseline albumin 2.3, baseline prealbumin low at 7, +BM.  Receiving PRN Phenergan Endo: no hx - CBGs acceptable on BMET Lytes: K+ remains at 3.2 despite supplementation, Phos improved slightly after 30 mmol of KPhos, Mag down to 1.8 Renal: SCr down to 0.68, CrCL 123 ml/min - decent UOP 0.4 ml/kg/min, NS at 125 ml/hr Pulm: remains on 3L [place] Cards: no hx - BP high normal, persistent tachycardia Hepatobil: LFTs normalized, TG WNL Neuro: hx anxiety - Dilaudid PCA ID: Zosyn D#5 for SIRS/sepsis, afebrile, WBC improving Best Practices: Lovenox TPN Access: PICC placed 08/10/14 TPN start date: 4/15 >>   Plan:  - KPhos 30 mmol IV x 1 now (~40 mEq of KCL) - Mag sulfate 3gm IV x  1 now - KCL x 2 runs - Recheck labs at 1600 prior to initiating TPN today - Clinimix E 5/15 30 ml/min + IVFE at 5 ml/hr - Daily multivitamin and trace elements - Decrease IVF to 95 ml/hr once TPN starts - Start sensitive SSI Q6H, d/c if CBGs remain controlled at goal TPN rate - F/U AM labs    Thuy D. Laney Potash, PharmD, BCPS Pager:  [phone] 08/11/2014, 8:07 AM   Addendum: Labs at 1600: K 3.4, Mg 2.3, Phos 1.7  Plan: KPhos 20 mmol IV  x1 now  Will f/u K, Mg, Phos in a.m.  Christoper Fabian, PharmD, BCPS Clinical pharmacist, pager [phone] 08/11/2014 5:16 PM

## 2014-08-12 LAB — BASIC METABOLIC PANEL
Anion gap: 11 (ref 5–15)
BUN: 7 mg/dL (ref 6–23)
CHLORIDE: 97 mmol/L (ref 96–112)
CO2: 27 mmol/L (ref 19–32)
CREATININE: 0.66 mg/dL (ref 0.50–1.10)
Calcium: 7.7 mg/dL — ABNORMAL LOW (ref 8.4–10.5)
GFR calc Af Amer: >90 mL/min (ref >90)
GLUCOSE: 142 mg/dL — AB (ref 70–99)
POTASSIUM: 3.7 mmol/L (ref 3.5–5.1)
Sodium: 135 mmol/L (ref 135–145)

## 2014-08-12 LAB — GLUCOSE, CAPILLARY
GLUCOSE-CAPILLARY: 149 mg/dL — AB (ref 70–99)
Glucose-Capillary: 130 mg/dL — ABNORMAL HIGH (ref 70–99)
Glucose-Capillary: 135 mg/dL — ABNORMAL HIGH (ref 70–99)
Glucose-Capillary: 135 mg/dL — ABNORMAL HIGH (ref 70–99)

## 2014-08-12 LAB — PHOSPHORUS: PHOSPHORUS: 2.1 mg/dL — AB (ref 2.3–4.6)

## 2014-08-12 LAB — PREALBUMIN: Prealbumin: 6 mg/dL — ABNORMAL LOW (ref 17–34)

## 2014-08-12 LAB — MAGNESIUM: MAGNESIUM: 1.9 mg/dL (ref 1.5–2.5)

## 2014-08-12 MED ORDER — POTASSIUM CHLORIDE 10 MEQ/50ML IV SOLN
10.0000 meq | Freq: Once | INTRAVENOUS | Status: AC
  Administered 2014-08-12: 10 meq via INTRAVENOUS
  Filled 2014-08-12: qty 50

## 2014-08-12 MED ORDER — SORBITOL 70 % SOLN: 960.0000 mL | TOPICAL_OIL | Freq: Once | ORAL | Status: AC | PRN

## 2014-08-12 MED ORDER — FLEET ENEMA 7-19 GM/118ML RE ENEM
1.0000 | ENEMA | Freq: Every day | RECTAL | Status: DC | PRN
  Filled 2014-08-12: qty 1

## 2014-08-12 MED ORDER — BISACODYL 10 MG RE SUPP
10.0000 mg | Freq: Once | RECTAL | Status: AC
  Administered 2014-08-12: 10 mg via RECTAL

## 2014-08-12 MED ORDER — FAT EMULSION 20 % IV EMUL
240.0000 mL | INTRAVENOUS | Status: AC
  Administered 2014-08-12: 240 mL via INTRAVENOUS
  Filled 2014-08-12: qty 250

## 2014-08-12 MED ORDER — TRACE MINERALS CR-CU-F-FE-I-MN-MO-SE-ZN IV SOLN: INTRAVENOUS | Status: DC

## 2014-08-12 MED ORDER — SODIUM CHLORIDE 0.9 % IV SOLN: INTRAVENOUS | Status: DC

## 2014-08-12 MED ORDER — FAT EMULSION 20 % IV EMUL: 240.0000 mL | INTRAVENOUS | Status: DC

## 2014-08-12 MED ORDER — POTASSIUM PHOSPHATES 15 MMOLE/5ML IV SOLN
15.0000 mmol | Freq: Once | INTRAVENOUS | Status: AC
  Administered 2014-08-12: 15 mmol via INTRAVENOUS
  Filled 2014-08-12: qty 5

## 2014-08-12 MED ORDER — TRACE MINERALS CR-CU-F-FE-I-MN-MO-SE-ZN IV SOLN
INTRAVENOUS | Status: AC
  Administered 2014-08-12: 17:00:00 via INTRAVENOUS
  Filled 2014-08-12: qty 1440

## 2014-08-12 MED ORDER — SIMETHICONE 80 MG PO CHEW
40.0000 mg | CHEWABLE_TABLET | Freq: Four times a day (QID) | ORAL | Status: DC
  Administered 2014-08-12 – 2014-08-16 (×11): 40 mg via ORAL
  Filled 2014-08-12 (×20): qty 1

## 2014-08-12 MED ORDER — MAGNESIUM SULFATE IN D5W 10-5 MG/ML-% IV SOLN
1.0000 g | Freq: Once | INTRAVENOUS | Status: AC
  Administered 2014-08-12: 1 g via INTRAVENOUS
  Filled 2014-08-12: qty 100

## 2014-08-12 NOTE — Progress Notes (Signed)
  Subjective: Still having epigastric pain.  Nausea present when she stands or walks.  Objective: Vital signs in last 24 hours: Temp:  [97.6 F (36.4 C)-99 F (37.2 C)] 97.9 F (36.6 C) (04/16 0827) Pulse Rate:  [116-123] 123 (04/16 0827) Resp:  [18-32] 23 (04/16 0834) BP: (138-155)/(89-98) 144/95 mmHg (04/16 0827) SpO2:  [98 %-99 %] 98 % (04/16 0834) FiO2 (%):  [98 %] 98 % (04/16 0834) Last BM Date: 08/10/14  Intake/Output from previous day: 04/15 0701 - 04/16 0700 In: 2117.7 [I.V.:325; IV Piggyback:1372.7; TPN:420] Out: 1650 [Urine:1650] Intake/Output this shift: Total I/O In: -  Out: 500 [Urine:500]  PE: General- In NAD Abdomen-slight firm, distended, quiet, tender in epigastrium  Lab Results:   Recent Labs  08/10/14 0304  WBC 18.2*  HGB 12.0  HCT 36.7  PLT 247   BMET  Recent Labs  08/11/14 1624 08/12/14 0420  NA 138 135  K 3.4* 3.7  CL 101 97  CO2 28 27  GLUCOSE 128* 142*  BUN 7 7  CREATININE 0.59 0.66  CALCIUM 7.8* 7.7*   PT/INR No results for input(s): LABPROT, INR in the last 72 hours. Comprehensive Metabolic Panel:    Component Value Date/Time   NA 135 08/12/2014 0420   NA 138 08/11/2014 1624   K 3.7 08/12/2014 0420   K 3.4* 08/11/2014 1624   CL 97 08/12/2014 0420   CL 101 08/11/2014 1624   CO2 27 08/12/2014 0420   CO2 28 08/11/2014 1624   BUN 7 08/12/2014 0420   BUN 7 08/11/2014 1624   CREATININE 0.66 08/12/2014 0420   CREATININE 0.59 08/11/2014 1624   GLUCOSE 142* 08/12/2014 0420   GLUCOSE 128* 08/11/2014 1624   CALCIUM 7.7* 08/12/2014 0420   CALCIUM 7.8* 08/11/2014 1624   AST 26 08/10/2014 0304   AST 32 08/09/2014 0326   ALT 23 08/10/2014 0304   ALT 28 08/09/2014 0326   ALKPHOS 56 08/10/2014 0304   ALKPHOS 54 08/09/2014 0326   BILITOT 0.7 08/10/2014 0304   BILITOT 0.8 08/09/2014 0326   PROT 5.9* 08/10/2014 0304   PROT 5.9* 08/09/2014 0326   ALBUMIN 2.3* 08/10/2014 0304   ALBUMIN 2.3* 08/09/2014 0326      Studies/Results: No results found.  Anti-infectives: Anti-infectives    Start     Dose/Rate Route Frequency Ordered Stop   08/07/14 0400  piperacillin-tazobactam (ZOSYN) IVPB 3.375 g     3.375 g 12.5 mL/hr over 240 Minutes Intravenous Every 8 hours 08/06/14 1910     08/06/14 1930  piperacillin-tazobactam (ZOSYN) IVPB 3.375 g     3.375 g 12.5 mL/hr over 240 Minutes Intravenous  Once 08/06/14 1909 08/06/14 2340      Assessment Severe acute gallstone pancreatitis with SIRS, =/- acute cholecystitis-on bowel rest and TPN.  Not ready for cholecystectomy currently.    LOS: 6 days    Plan: Continue bowel rest and nutritional support until process significantly improves.   Bocephus Cali J 08/12/2014

## 2014-08-12 NOTE — Progress Notes (Signed)
[place] TEAM 1 - Stepdown/ICU TEAM Progress Note  [name] Blanchard STM:[id number] DOB: 02-13-1991 DOA: 08/06/2014 PCP: No PCP Per Patient  Admit HPI / Brief Narrative: 24 y/o female with with chronic intermittent abdominal pain for 6 months admitted with acute pancreatitis.  Patient was transferred from [facility] ER where lipase was reportedly over 37,000, w/ mildly elevated lfts, nml alk phos and tbili; and a CT showing a stone in the gallbladder w/ GB wall thickening + edema around the pancreas w/ cbd dilation but with no stone.   HPI/Subjective: Doesn't feel as good today.  She reports significant constipation.  She denies chest pain or shortness of breath.  No vomiting.  Assessment/Plan:  Acute severe gallstone pancreatitis Lipase had been fluctuating, but has normalized - Gen Surg following - will require cholecystectomy prior to d/c once more stable - continue PCA and follow   Constipation Primarily driven by PCA - treat from below and follow  SIRS in the setting of acute pancreatitis SIRS physiology persists but pt is hemodynamically stable   Hypokalemia Corrected with TNA  Hypophosphatemia Improving with TNA  Nutrition TNA infusing  Obesity - Body mass index is 33.57 kg/(m^2).   Code Status: FULL Family Communication: spoke w/ aunt at bedside  Disposition Plan: SDU   Consultants: Gen Surgery   Procedures: HIDA - 4/13 - unremarkable   Antibiotics: Zosyn 4/10 >  DVT prophylaxis: SCDs  Objective: Blood pressure 136/82, pulse 112, temperature 97.7 F (36.5 C), temperature source Oral, resp. rate 23, height 5' 5"  (1.651 m), weight 91.5 kg (201 lb 11.5 oz), last menstrual period 08/06/2014, SpO2 98 %.  Intake/Output Summary (Last 24 hours) at 08/12/14 1428 Last data filed at 08/12/14 1323  Gross per 24 hour  Intake 1351.67 ml  Output   2450 ml  Net -1098.33 ml   Exam: General: No acute respiratory distress Lungs: Clear to auscultation bilaterally  without wheezes or crackles Cardiovascular: tachycardic but regular - no gallup or rub or murmur Abdomen: Obese, tender diffusely, soft, bs absent, no appreciable mass w/ gentle palpation  Extremities: No significant cyanosis, or clubbing;  1+ edema bilateral lower extremities w/o change   Data Reviewed: Basic Metabolic Panel:  Recent Labs Lab 08/06/14 2007  08/09/14 0326 08/10/14 0304 08/11/14 0500 08/11/14 1624 08/12/14 0420  NA 138  < > 140 142 137 138 135  K 4.7  < > 3.7 3.2* 3.2* 3.4* 3.7  CL 107  < > 107 104 100 101 97  CO2 20  < > 23 24 28 28 27   GLUCOSE 155*  < > 136* 105* 118* 128* 142*  BUN 6  < > 11 7 8 7 7   CREATININE 0.86  < > 0.92 0.85 0.68 0.59 0.66  CALCIUM 8.0*  < > 6.6* 7.4* 7.7* 7.8* 7.7*  MG 1.6  --   --  2.0 1.8 2.3 1.9  PHOS 3.5  --   --  1.1* 1.3* 1.7* 2.1*  < > = values in this interval not displayed.  Liver Function Tests:  Recent Labs Lab 08/06/14 2007 08/07/14 0835 08/08/14 1209 08/09/14 0326 08/10/14 0304  AST 45* 37 34 32 26  ALT 55* 42* 29 28 23   ALKPHOS 60 52 41 54 56  BILITOT 0.9 0.8 0.8 0.8 0.7  PROT 7.1 6.9 6.0 5.9* 5.9*  ALBUMIN 3.4* 3.1* 2.6* 2.3* 2.3*    Recent Labs Lab 08/06/14 2007 08/07/14 0835 08/08/14 1209 08/09/14 0326 08/10/14 0304  LIPASE 400* 795* 576* 183* 55  Coags:  Recent Labs Lab 08/06/14 2007  INR 0.99    Recent Labs Lab 08/06/14 2007  APTT 22*    CBC:  Recent Labs Lab 08/06/14 2007 08/07/14 0835 08/08/14 0803 08/09/14 0326 08/10/14 0304  WBC 21.8* 30.9* 39.2* 28.1* 18.2*  NEUTROABS 20.1*  --   --   --   --   HGB 16.4* 17.4* 16.5* 13.9 12.0  HCT 47.9* 52.1* 48.9* 42.2 36.7  MCV 90.0 92.4 92.4 92.5 91.8  PLT 284 368 291 282 247    Recent Results (from the past 240 hour(s))  MRSA PCR Screening     Status: None   Collection Time: 08/08/14  4:00 PM  Result Value Ref Range Status   MRSA by PCR NEGATIVE NEGATIVE Final    Comment:        The GeneXpert MRSA Assay (FDA approved for  NASAL specimens only), is one component of a comprehensive MRSA colonization surveillance program. It is not intended to diagnose MRSA infection nor to guide or monitor treatment for MRSA infections.      Studies:   Recent x-ray studies have been reviewed in detail by the Attending Physician  Scheduled Meds:  Scheduled Meds: . antiseptic oral rinse  7 mL Mouth Rinse q12n4p  . chlorhexidine  15 mL Mouth Rinse BID  . enoxaparin (LOVENOX) injection  40 mg Subcutaneous Q24H  . HYDROmorphone PCA 0.3 mg/mL   Intravenous 6 times per day  . insulin aspart  0-9 Units Subcutaneous 4 times per day  . pantoprazole (PROTONIX) IV  40 mg Intravenous Q12H  . piperacillin-tazobactam (ZOSYN)  IV  3.375 g Intravenous Q8H  . potassium phosphate IVPB (mmol)  15 mmol Intravenous Once  . sodium chloride  10-40 mL Intracatheter Q12H  . sodium chloride  3 mL Intravenous Q12H    Time spent on care of this patient: 25 mins   MCCLUNG,JEFFREY T , MD   [facility] Office  [phone] Pager - Text Page per Shea Evans as per below:  On-Call/Text Page:      [email or web address]      password [email or web address]  If 7PM-7AM, please contact night-coverage [email or web address] Password [email or web address] 08/12/2014, 2:28 PM   LOS: 6 days

## 2014-08-12 NOTE — Progress Notes (Signed)
PARENTERAL NUTRITION CONSULT NOTE - FOLLOW UP  Pharmacy Consult:  TPN Indication: Severe pancreatitis  No Known Allergies  Patient Measurements: Height: 5\' 5"  (165.1 cm) Weight: 201 lb 11.5 oz (91.5 kg) IBW/kg (Calculated) : 57 Adjusted Body Weight: 66 kg  Vital Signs: Temp: 99 F (37.2 C) (04/16 0405) Temp Source: Oral (04/16 0405) BP: 155/91 mmHg (04/16 0405) Intake/Output from previous day: 04/15 0701 - 04/16 0700 In: 2117.7 [I.V.:325; IV Piggyback:1372.7; TPN:420] Out: 1650 [Urine:1650]  Labs:  Recent Labs  08/10/14 0304  WBC 18.2*  HGB 12.0  HCT 36.7  PLT 247     Recent Labs  08/10/14 0304 08/10/14 0500 08/11/14 0500 08/11/14 1624 08/12/14 0420  NA 142  --  137 138 135  K 3.2*  --  3.2* 3.4* 3.7  CL 104  --  100 101 97  CO2 24  --  28 28 27   GLUCOSE 105*  --  118* 128* 142*  BUN 7  --  8 7 7   CREATININE 0.85  --  0.68 0.59 0.66  CALCIUM 7.4*  --  7.7* 7.8* 7.7*  MG 2.0  --  1.8 2.3 1.9  PHOS 1.1*  --  1.3* 1.7* 2.1*  PROT 5.9*  --   --   --   --   ALBUMIN 2.3*  --   --   --   --   AST 26  --   --   --   --   ALT 23  --   --   --   --   ALKPHOS 56  --   --   --   --   BILITOT 0.7  --   --   --   --   PREALBUMIN 7*  --  6*  --   --   TRIG  --  54  --   --   --    Estimated Creatinine Clearance: 123.3 mL/min (by C-G formula based on Cr of 0.66).    Recent Labs  08/11/14 1829 08/11/14 2330 08/12/14 0635  GLUCAP 110* 130* 135*       Insulin Requirements in the past 24 hours:  2 units Novolog  Current Nutrition:  NPO  Nutritional Goals:  2000-2200 kCal, 100-110 gm of protein per day   Assessment: 23 YOF presents on 08/06/14 with severe abdominal pain.  Initial lipase was over 37,000 with mildly elevated LFTs.  CT showed a stone in the gall bladder.  Surgery would like to start TPN instead of a trial of post-pyloric trickle feed for severe pancreatitis.  Noted patient was trying to lose weight PTA, limited to no PO intake since  08/05/14, and baseline electrolytes are low.  Concerned with refeeding; thus, TPN initiation was delayed to supplement electrolytes.  Surgeries/Procedures: possible lap chole early next week GI: baseline albumin 2.3, baseline prealbumin low at 7, +BM.  Receiving PRN Phenergan Endo: no hx - CBGs <180, well-controlled thus far Lytes: K+ 3.7, improving with supplementation, Phos improved slightly after multiple doses of KPhos but is still low, Mag 1.9.  Will advance TPN slowly with refeeding risk and low electrolytes. Renal: SCr down to 0.66, CrCL 123 ml/min - decent UOP 0.8 ml/kg/min, NS at 50 ml/hr Pulm: remains on 3L [place] Cards: no hx - BP high normal, persistent tachycardia Hepatobil: LFTs normalized, TG WNL Neuro: hx anxiety - Dilaudid PCA ID: Zosyn D#[id number] for SIRS/sepsis, afebrile, WBC improving Best Practices: Lovenox TPN Access: PICC placed 08/10/14 TPN start date: 4/15 >>  Plan:  - KPhos 15 mmol IV x 1 now (~20 mEq of KCL) - Mag sulfate 1gm IV x 1 now - KCL x 1 run - Increase Clinimix E 5/15 to 60 ml/min + IVFE to 10 ml/hr - Daily multivitamin and trace elements - Decrease IVF to 20 ml/hr when TPN is increased - Continue sensitive SSI Q6H, d/c if CBGs remain controlled at goal TPN rate - F/U AM labs  Estella Husk, Pharm.D., BCPS, AAHIVP Clinical Pharmacist Phone: [phone] or [phone] 08/12/2014, 8:16 AM

## 2014-08-13 DIAGNOSIS — I471 Supraventricular tachycardia: Secondary | ICD-10-CM

## 2014-08-13 LAB — GLUCOSE, CAPILLARY
GLUCOSE-CAPILLARY: 184 mg/dL — AB (ref 70–99)
Glucose-Capillary: 147 mg/dL — ABNORMAL HIGH (ref 70–99)
Glucose-Capillary: 186 mg/dL — ABNORMAL HIGH (ref 70–99)
Glucose-Capillary: 189 mg/dL — ABNORMAL HIGH (ref 70–99)

## 2014-08-13 LAB — COMPREHENSIVE METABOLIC PANEL
ALT: 14 U/L (ref 0–35)
AST: 33 U/L (ref 0–37)
Albumin: 2.1 g/dL — ABNORMAL LOW (ref 3.5–5.2)
Alkaline Phosphatase: 98 U/L (ref 39–117)
Anion gap: 11 (ref 5–15)
BILIRUBIN TOTAL: 1.1 mg/dL (ref 0.3–1.2)
BUN: 6 mg/dL (ref 6–23)
CO2: 27 mmol/L (ref 19–32)
Calcium: 7.6 mg/dL — ABNORMAL LOW (ref 8.4–10.5)
Chloride: 94 mmol/L — ABNORMAL LOW (ref 96–112)
Creatinine, Ser: 0.62 mg/dL (ref 0.50–1.10)
GFR calc Af Amer: >90 mL/min (ref >90)
GFR calc non Af Amer: >90 mL/min (ref >90)
Glucose, Bld: 163 mg/dL — ABNORMAL HIGH (ref 70–99)
Potassium: 4.2 mmol/L (ref 3.5–5.1)
SODIUM: 132 mmol/L — AB (ref 135–145)
Total Protein: 5.6 g/dL — ABNORMAL LOW (ref 6.0–8.3)

## 2014-08-13 LAB — PHOSPHORUS: Phosphorus: 2.9 mg/dL (ref 2.3–4.6)

## 2014-08-13 LAB — LIPASE, BLOOD: Lipase: 40 U/L (ref 11–59)

## 2014-08-13 LAB — MAGNESIUM: Magnesium: 2 mg/dL (ref 1.5–2.5)

## 2014-08-13 MED ORDER — FAT EMULSION 20 % IV EMUL
240.0000 mL | INTRAVENOUS | Status: AC
  Administered 2014-08-13: 240 mL via INTRAVENOUS
  Filled 2014-08-13: qty 250

## 2014-08-13 MED ORDER — TRACE MINERALS CR-CU-F-FE-I-MN-MO-SE-ZN IV SOLN
INTRAVENOUS | Status: AC
  Administered 2014-08-13: 18:00:00 via INTRAVENOUS
  Filled 2014-08-13: qty 1992

## 2014-08-13 NOTE — Progress Notes (Signed)
Dilaudid changed in PCA pump verified by Minette BrineBernadette Roncall, RN. 3cc wasted in sink with Nigel SloopBernadette Roncallo, RN.

## 2014-08-13 NOTE — Progress Notes (Signed)
[place] TEAM 1 - Stepdown/ICU TEAM Progress Note  [name] Blanchard ENI:[id number] DOB: 12/13/1990 DOA: 08/06/2014 PCP: No PCP Per Patient  Admit HPI / Brief Narrative: 24 y/o female with with chronic intermittent abdominal pain for 6 months admitted with acute pancreatitis.  Patient was transferred from [facility] ER where lipase was reportedly over 37,000, w/ mildly elevated lfts, nml alk phos and tbili; and a CT showing a stone in the gallbladder w/ GB wall thickening + edema around the pancreas w/ cbd dilation but with no stone.   HPI/Subjective: The patient states she is feeling better this afternoon.  She reports she had 2 bowel movements this morning.  She is still struggling with some nausea and did vomit a couple times earlier this morning.  She denies severe pain in the present time.  She denies chest pain or shortness of breath.  Assessment/Plan:  Acute severe gallstone pancreatitis Lipase had been fluctuating, but has normalized - Gen Surg following - will require cholecystectomy prior to d/c once more stable - continue PCA and follow - will recheck CT scan abdomen in next day or 2  Constipation Primarily driven by PCA - resolved  SIRS in the setting of acute pancreatitis SIRS physiology persists but pt is hemodynamically stable   Hypokalemia Corrected with TNA  Hypophosphatemia Corrected with TNA  Nutrition TNA infusing  Obesity - Body mass index is 33.6 kg/(m^2).   Code Status: FULL Family Communication: No family present at time of exam today  Disposition Plan: SDU - probable transfer to medical bed 4/18  Consultants: Gen Surgery   Procedures: HIDA - 4/13 - unremarkable   Antibiotics: Zosyn 4/10 >  DVT prophylaxis: SCDs  Objective: Blood pressure 143/107, pulse 127, temperature 97.1 F (36.2 C), temperature source Axillary, resp. rate 23, height 5' 5"  (1.651 m), weight 91.581 kg (201 lb 14.4 oz), last menstrual period 08/06/2014, SpO2 98  %.  Intake/Output Summary (Last 24 hours) at 08/13/14 1642 Last data filed at 08/13/14 1600  Gross per 24 hour  Intake 1864.51 ml  Output   1700 ml  Net 164.51 ml   Exam: General: No acute respiratory distress - alert and oriented Lungs: Clear to auscultation bilaterally without wheezes or crackles Cardiovascular: tachycardic but regular - no gallup or rub or murmur Abdomen: Obese, moderately tender primarily in epigastrium, soft, bs are hypoactive but present, no appreciable mass w/ gentle palpation  Extremities: No significant cyanosis, or clubbing;  2+ edema bilateral lower extremities   Data Reviewed: Basic Metabolic Panel:  Recent Labs Lab 08/10/14 0304 08/11/14 0500 08/11/14 1624 08/12/14 0420 08/13/14 0430  NA 142 137 138 135 132*  K 3.2* 3.2* 3.4* 3.7 4.2  CL 104 100 101 97 94*  CO2 24 28 28 27 27   GLUCOSE 105* 118* 128* 142* 163*  BUN 7 8 7 7 6   CREATININE 0.85 0.68 0.59 0.66 0.62  CALCIUM 7.4* 7.7* 7.8* 7.7* 7.6*  MG 2.0 1.8 2.3 1.9 2.0  PHOS 1.1* 1.3* 1.7* 2.1* 2.9    Liver Function Tests:  Recent Labs Lab 08/07/14 0835 08/08/14 1209 08/09/14 0326 08/10/14 0304 08/13/14 0430  AST 37 34 32 26 33  ALT 42* 29 28 23 14   ALKPHOS 52 41 54 56 98  BILITOT 0.8 0.8 0.8 0.7 1.1  PROT 6.9 6.0 5.9* 5.9* 5.6*  ALBUMIN 3.1* 2.6* 2.3* 2.3* 2.1*    Recent Labs Lab 08/07/14 0835 08/08/14 1209 08/09/14 0326 08/10/14 0304 08/13/14 0430  LIPASE 795* 576* 183* 55 40  Coags:  Recent Labs Lab 08/06/14 2007  INR 0.99    Recent Labs Lab 08/06/14 2007  APTT 22*    CBC:  Recent Labs Lab 08/06/14 2007 08/07/14 0835 08/08/14 0803 08/09/14 0326 08/10/14 0304  WBC 21.8* 30.9* 39.2* 28.1* 18.2*  NEUTROABS 20.1*  --   --   --   --   HGB 16.4* 17.4* 16.5* 13.9 12.0  HCT 47.9* 52.1* 48.9* 42.2 36.7  MCV 90.0 92.4 92.4 92.5 91.8  PLT 284 368 291 282 247    Recent Results (from the past 240 hour(s))  MRSA PCR Screening     Status: None    Collection Time: 08/08/14  4:00 PM  Result Value Ref Range Status   MRSA by PCR NEGATIVE NEGATIVE Final    Comment:        The GeneXpert MRSA Assay (FDA approved for NASAL specimens only), is one component of a comprehensive MRSA colonization surveillance program. It is not intended to diagnose MRSA infection nor to guide or monitor treatment for MRSA infections.      Studies:   Recent x-ray studies have been reviewed in detail by the Attending Physician  Scheduled Meds:  Scheduled Meds: . antiseptic oral rinse  7 mL Mouth Rinse q12n4p  . chlorhexidine  15 mL Mouth Rinse BID  . enoxaparin (LOVENOX) injection  40 mg Subcutaneous Q24H  . HYDROmorphone PCA 0.3 mg/mL   Intravenous 6 times per day  . insulin aspart  0-9 Units Subcutaneous 4 times per day  . pantoprazole (PROTONIX) IV  40 mg Intravenous Q12H  . piperacillin-tazobactam (ZOSYN)  IV  3.375 g Intravenous Q8H  . simethicone  40 mg Oral QID  . sodium chloride  10-40 mL Intracatheter Q12H  . sodium chloride  3 mL Intravenous Q12H    Time spent on care of this patient: 25 mins   MCCLUNG,JEFFREY T , MD   [facility] Office  [phone] Pager - Text Page per Shea Evans as per below:  On-Call/Text Page:      [email or web address]      password [email or web address]  If 7PM-7AM, please contact night-coverage [email or web address] Password [email or web address] 08/13/2014, 4:42 PM   LOS: 7 days

## 2014-08-13 NOTE — Progress Notes (Signed)
PARENTERAL NUTRITION CONSULT NOTE - FOLLOW UP  Pharmacy Consult:  TPN Indication: Severe pancreatitis  No Known Allergies  Patient Measurements: Height: 5\' 5"  (165.1 cm) Weight: 201 lb 14.4 oz (91.581 kg) IBW/kg (Calculated) : 57 Adjusted Body Weight: 66 kg  Vital Signs: Temp: 97.7 F (36.5 C) (04/17 0446) Temp Source: Oral (04/17 0446) BP: 150/96 mmHg (04/17 0446) Pulse Rate: 121 (04/17 0446) Intake/Output from previous day: 04/16 0701 - 04/17 0700 In: 1380.4 [IV Piggyback:100; TPN:1280.4] Out: 2050 [Urine:2050]  Labs: No results for input(s): WBC, HGB, HCT, PLT, APTT, INR in the last 72 hours.   Recent Labs  08/11/14 0500 08/11/14 1624 08/12/14 0420 08/13/14 0430  NA 137 138 135 132*  K 3.2* 3.4* 3.7 4.2  CL 100 101 97 94*  CO2 28 28 27 27   GLUCOSE 118* 128* 142* 163*  BUN 8 7 7 6   CREATININE 0.68 0.59 0.66 0.62  CALCIUM 7.7* 7.8* 7.7* 7.6*  MG 1.8 2.3 1.9 2.0  PHOS 1.3* 1.7* 2.1* 2.9  PROT  --   --   --  5.6*  ALBUMIN  --   --   --  2.1*  AST  --   --   --  33  ALT  --   --   --  14  ALKPHOS  --   --   --  98  BILITOT  --   --   --  1.1  PREALBUMIN 6*  --   --   --    Estimated Creatinine Clearance: 123.3 mL/min (by C-G formula based on Cr of 0.62).    Recent Labs  08/12/14 1813 08/13/14 0005 08/13/14 0542  GLUCAP 149* 147* 184*    Insulin Requirements in the past 24 hours:  4 units Novolog  Current Nutrition:  NPO  Nutritional Goals:  2000-2200 kCal, 100-110 gm of protein per day   Assessment: 23 YOF presents on 08/06/14 with severe abdominal pain.  Initial lipase was over 37,000 with mildly elevated LFTs.  CT showed a stone in the gall bladder.  Surgery would like to start TPN instead of a trial of post-pyloric trickle feed for severe pancreatitis.  Noted patient was trying to lose weight PTA, limited to no PO intake since 08/05/14, and baseline electrolytes are low.  Concerned with refeeding; thus, TPN initiation was delayed to supplement  electrolytes.  Surgeries/Procedures: possible lap chole when clinically improved. GI: baseline albumin 2.3, baseline prealbumin low at 7, +BM.  Receiving PRN Phenergan and Zofran Endo: no hx - all CBGs <180 except for 1, well-controlled thus far Lytes: K+ 4.2, improved after supplementation, Phos now within normal range at 2.9 after multiple doses of KPhos, Mag 2.  With improved electrolytes will advance TPN to goal Renal: SCr down to 0.62, CrCL 123 ml/min - decent UOP 0.9 ml/kg/min, IV fluids dc'd Pulm: remains on 3L  Cards: no hx - BP high normal, persistent tachycardia Hepatobil: LFTs normalized, TG WNL Neuro: hx anxiety - Dilaudid PCA ID: Zosyn D#[id number] for SIRS/sepsis, afebrile, WBC improving Best Practices: Lovenox, Protonix IV TPN Access: PICC placed 08/10/14 TPN start date: 4/15 >>   Plan:  - Increase Clinimix E 5/15 to 83 ml/min + IVFE to 10 ml/hr.  This will provide 1894 kCal per day and 100g of protein per day to meet 95% of her estimated kCal needs and 100% of her estimated protein needs. - Daily multivitamin and trace elements - Continue CBGs and sensitive SSI q6h, follow for tolerance with advancing TPN  rate - Weekly TPN labs on Monday  Estella Husk, [place].D., BCPS, AAHIVP Clinical Pharmacist Phone: [phone] or [phone] 08/13/2014, 8:04 AM

## 2014-08-13 NOTE — Progress Notes (Signed)
  Subjective: Complains of central abdominal pain with nausea  Objective: Vital signs in last 24 hours: Temp:  [97.7 F (36.5 C)-98 F (36.7 C)] 97.7 F (36.5 C) (04/17 0446) Pulse Rate:  [112-131] 121 (04/17 0446) Resp:  [22-30] 22 (04/17 0800) BP: (136-158)/(82-96) 150/96 mmHg (04/17 0446) SpO2:  [95 %-99 %] 98 % (04/17 0800) FiO2 (%):  [96 %-98 %] 98 % (04/17 0800) Weight:  [91.581 kg (201 lb 14.4 oz)] 91.581 kg (201 lb 14.4 oz) (04/17 0434) Last BM Date: 08/10/14  Intake/Output from previous day: 04/16 0701 - 04/17 0700 In: 1500.4 [IV Piggyback:150; TPN:1350.4] Out: 2050 [Urine:2050] Intake/Output this shift: Total I/O In: 70 [TPN:70] Out: 300 [Urine:300]  Resp: clear to auscultation bilaterally Cardio: still tachycardic GI: moderate central tenderness  Lab Results:  No results for input(s): WBC, HGB, HCT, PLT in the last 72 hours. BMET  Recent Labs  08/12/14 0420 08/13/14 0430  NA 135 132*  K 3.7 4.2  CL 97 94*  CO2 27 27  GLUCOSE 142* 163*  BUN 7 6  CREATININE 0.66 0.62  CALCIUM 7.7* 7.6*   PT/INR No results for input(s): LABPROT, INR in the last 72 hours. ABG No results for input(s): PHART, HCO3 in the last 72 hours.  Invalid input(s): PCO2, PO2  Studies/Results: No results found.  Anti-infectives: Anti-infectives    Start     Dose/Rate Route Frequency Ordered Stop   08/07/14 0400  piperacillin-tazobactam (ZOSYN) IVPB 3.375 g     3.375 g 12.5 mL/hr over 240 Minutes Intravenous Every 8 hours 08/06/14 1910     08/06/14 1930  piperacillin-tazobactam (ZOSYN) IVPB 3.375 g     3.375 g 12.5 mL/hr over 240 Minutes Intravenous  Once 08/06/14 1909 08/06/14 2340      Assessment/Plan: s/p * No surgery found * Although her enzymes have normalized she continue to have significant pain. She will likely benefit from cholecystectomy once her symptoms resolve. Will consider follow up scan to see if pancreas is improving  LOS: 7 days    TOTH III,PAUL  S 08/13/2014

## 2014-08-14 ENCOUNTER — Inpatient Hospital Stay (HOSPITAL_COMMUNITY): Payer: Managed Care, Other (non HMO)

## 2014-08-14 ENCOUNTER — Encounter (HOSPITAL_COMMUNITY): Payer: Self-pay | Admitting: Radiology

## 2014-08-14 DIAGNOSIS — K550 Acute vascular disorders of intestine: Secondary | ICD-10-CM

## 2014-08-14 LAB — DIFFERENTIAL
BASOS PCT: 0 % (ref 0–1)
Basophils Absolute: 0 10*3/uL (ref 0.0–0.1)
Eosinophils Absolute: 0.2 10*3/uL (ref 0.0–0.7)
Eosinophils Relative: 1 % (ref 0–5)
Lymphocytes Relative: 6 % — ABNORMAL LOW (ref 12–46)
Lymphs Abs: 1.4 10*3/uL (ref 0.7–4.0)
Monocytes Absolute: 1.7 10*3/uL — ABNORMAL HIGH (ref 0.1–1.0)
Monocytes Relative: 7 % (ref 3–12)
Neutro Abs: 20.4 10*3/uL — ABNORMAL HIGH (ref 1.7–7.7)
Neutrophils Relative %: 86 % — ABNORMAL HIGH (ref 43–77)

## 2014-08-14 LAB — CBC
HEMATOCRIT: 35 % — AB (ref 36.0–46.0)
HEMOGLOBIN: 11.7 g/dL — AB (ref 12.0–15.0)
MCH: 29.9 pg (ref 26.0–34.0)
MCHC: 33.4 g/dL (ref 30.0–36.0)
MCV: 89.5 fL (ref 78.0–100.0)
Platelets: 185 10*3/uL (ref 150–400)
RBC: 3.91 MIL/uL (ref 3.87–5.11)
RDW: 13.5 % (ref 11.5–15.5)
WBC: 23.7 10*3/uL — ABNORMAL HIGH (ref 4.0–10.5)

## 2014-08-14 LAB — GLUCOSE, CAPILLARY
GLUCOSE-CAPILLARY: 194 mg/dL — AB (ref 70–99)
GLUCOSE-CAPILLARY: 192 mg/dL — AB (ref 70–99)
Glucose-Capillary: 172 mg/dL — ABNORMAL HIGH (ref 70–99)
Glucose-Capillary: 186 mg/dL — ABNORMAL HIGH (ref 70–99)

## 2014-08-14 MED ORDER — IOHEXOL 300 MG/ML  SOLN
25.0000 mL | INTRAMUSCULAR | Status: AC
  Administered 2014-08-14 (×2): 25 mL via ORAL

## 2014-08-14 MED ORDER — ENOXAPARIN SODIUM 100 MG/ML ~~LOC~~ SOLN
1.0000 mg/kg | Freq: Two times a day (BID) | SUBCUTANEOUS | Status: DC
  Administered 2014-08-14 – 2014-08-21 (×14): 90 mg via SUBCUTANEOUS
  Filled 2014-08-14 (×15): qty 1

## 2014-08-14 MED ORDER — IOHEXOL 300 MG/ML  SOLN
80.0000 mL | Freq: Once | INTRAMUSCULAR | Status: AC | PRN
  Administered 2014-08-14: 80 mL via INTRAVENOUS

## 2014-08-14 MED ORDER — INSULIN ASPART 100 UNIT/ML ~~LOC~~ SOLN
0.0000 [IU] | SUBCUTANEOUS | Status: DC
  Administered 2014-08-14 (×2): 3 [IU] via SUBCUTANEOUS
  Administered 2014-08-14: 2 [IU] via SUBCUTANEOUS
  Administered 2014-08-15 (×2): 3 [IU] via SUBCUTANEOUS
  Administered 2014-08-15 (×2): 2 [IU] via SUBCUTANEOUS
  Administered 2014-08-15: 3 [IU] via SUBCUTANEOUS
  Administered 2014-08-16: 2 [IU] via SUBCUTANEOUS
  Administered 2014-08-16 (×3): 3 [IU] via SUBCUTANEOUS

## 2014-08-14 MED ORDER — LORAZEPAM 2 MG/ML IJ SOLN
0.5000 mg | INTRAMUSCULAR | Status: DC | PRN
Start: 2014-08-14 — End: 2014-08-24
  Administered 2014-08-14 (×2): 0.5 mg via INTRAVENOUS
  Filled 2014-08-14 (×2): qty 1

## 2014-08-14 MED ORDER — FAT EMULSION 20 % IV EMUL
240.0000 mL | INTRAVENOUS | Status: AC
  Administered 2014-08-14: 240 mL via INTRAVENOUS
  Filled 2014-08-14: qty 250

## 2014-08-14 MED ORDER — TRACE MINERALS CR-CU-F-FE-I-MN-MO-SE-ZN IV SOLN
INTRAVENOUS | Status: AC
  Administered 2014-08-14: 17:00:00 via INTRAVENOUS
  Filled 2014-08-14: qty 1992

## 2014-08-14 NOTE — Progress Notes (Signed)
[place] TEAM 1 - Stepdown/ICU TEAM Progress Note  [name] Blanchard CXK:[id number] DOB: Apr 21, 1991 DOA: 08/06/2014 PCP: No PCP Per Patient  Admit HPI / Brief Narrative: 24 y/o female with with chronic intermittent abdominal pain for 6 months admitted with acute pancreatitis.  Patient was transferred from [facility] ER where lipase was reportedly over 37,000, w/ mildly elevated lfts, nml alk phos and tbili; and a CT showing a stone in the gallbladder w/ GB wall thickening + edema around the pancreas w/ cbd dilation but with no stone.   HPI/Subjective: The patient states that her pain is fairly well controlled and he is "never above 6."  She denies chest pain shortness of breath headache or present nausea.  She did have some nausea with small-volume vomiting earlier this morning.  Assessment/Plan:  Acute severe gallstone pancreatitis Lipase has normalized - Gen Surg following - follow-up CT scan of the abdomen today raises question of possible partial pancreatic necrosis and persistent/increased peripancreatic inflammatory change - additionally there is a small segment thrombus within the superior mesenteric vein though the portal and splenic veins appear patent - discussed w/ GI on call who agree with me that anticoag is reasonable due to concern that clot could propagate - need to monitor closely on anticoag as pt is at risk for bleeding - discussed plan of care with general surgery  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   Code Status: FULL Family Communication: No family present at time of exam today  Disposition Plan: SDU  Consultants: Gen Surgery   Procedures: HIDA - 4/13 - unremarkable   Antibiotics: Zosyn 4/10 >  DVT prophylaxis: SCDs  Objective: Blood pressure 140/93, pulse 127, temperature 98.6 F (37 C), temperature source Oral, resp. rate 27, height 5' 5"  (1.651 m), weight 91.581 kg (201 lb 14.4 oz), last menstrual period 08/06/2014, SpO2 97 %.  Intake/Output Summary (Last 24 hours) at 08/14/14 1631 Last data filed at 08/14/14 1615  Gross per 24 hour  Intake 2492.25 ml  Output   2950 ml  Net -457.75 ml   Exam: General: No acute respiratory distress - alert and oriented Lungs: Clear to auscultation bilaterally without wheezes or crackles Cardiovascular: tachycardic but regular - no gallup / rub / murmur Abdomen: Obese, soft, bs are hypoactive but present, no appreciable mass w/ gentle palpation  Extremities: No significant cyanosis, or clubbing;  2+ edema bilateral lower extremities   Data Reviewed: Basic Metabolic Panel:  Recent Labs Lab 08/10/14 0304 08/11/14 0500 08/11/14 1624 08/12/14 0420 08/13/14 0430  NA 142 137 138 135 132*  K 3.2* 3.2* 3.4* 3.7 4.2  CL 104 100 101 97 94*  CO2 24 28 28 27 27   GLUCOSE 105* 118* 128* 142* 163*  BUN 7 8 7 7 6   CREATININE 0.85 0.68 0.59 0.66 0.62  CALCIUM 7.4* 7.7* 7.8* 7.7* 7.6*  MG 2.0 1.8 2.3 1.9 2.0  PHOS 1.1* 1.3* 1.7* 2.1* 2.9    Liver Function Tests:  Recent  Labs Lab 08/08/14 1209 08/09/14 0326 08/10/14 0304 08/13/14 0430  AST 34 32 26 33  ALT 29 28 23 14   ALKPHOS 41 54 56 98  BILITOT 0.8 0.8 0.7 1.1  PROT 6.0 5.9* 5.9* 5.6*  ALBUMIN 2.6* 2.3* 2.3* 2.1*    Recent Labs Lab 08/08/14 1209 08/09/14 0326 08/10/14 0304 08/13/14 0430  LIPASE 576* 183* 55 40   Coags: No results for input(s): INR in the last 168 hours.  Invalid  input(s): PT No results for input(s): APTT in the last 168 hours.  CBC:  Recent Labs Lab 08/08/14 0803 08/09/14 0326 08/10/14 0304 08/14/14 0555  WBC 39.2* 28.1* 18.2* 23.7*  NEUTROABS  --   --   --  20.4*  HGB 16.5* 13.9 12.0 11.7*  HCT 48.9* 42.2 36.7 35.0*  MCV 92.4 92.5 91.8 89.5  PLT 291 282 247 185    Recent Results (from the past 240 hour(s))  MRSA PCR Screening     Status: None   Collection Time: 08/08/14  4:00 PM  Result Value Ref Range Status   MRSA by PCR NEGATIVE NEGATIVE Final    Comment:        The GeneXpert MRSA Assay (FDA approved for NASAL specimens only), is one component of a comprehensive MRSA colonization surveillance program. It is not intended to diagnose MRSA infection nor to guide or monitor treatment for MRSA infections.      Studies:   Recent x-ray studies have been reviewed in detail by the Attending Physician  Scheduled Meds:  Scheduled Meds: . antiseptic oral rinse  7 mL Mouth Rinse q12n4p  . chlorhexidine  15 mL Mouth Rinse BID  . enoxaparin (LOVENOX) injection  40 mg Subcutaneous Q24H  . HYDROmorphone PCA 0.3 mg/mL   Intravenous 6 times per day  . insulin aspart  0-15 Units Subcutaneous 6 times per day  . pantoprazole (PROTONIX) IV  40 mg Intravenous Q12H  . piperacillin-tazobactam (ZOSYN)  IV  3.375 g Intravenous Q8H  . simethicone  40 mg Oral QID  . sodium chloride  10-40 mL Intracatheter Q12H  . sodium chloride  3 mL Intravenous Q12H    Time spent on care of this patient: 35 mins   Wilberto Console T , MD   [facility] Office  [phone] Pager - Text Page per Shea Evans as per below:  On-Call/Text Page:      [email or web address]      password [email or web address]  If 7PM-7AM, please contact night-coverage [email or web address] Password [email or web address] 08/14/2014, 4:31 PM   LOS: 8 days

## 2014-08-14 NOTE — Progress Notes (Signed)
ANTICOAGULATION CONSULT NOTE - Initial Consult  Pharmacy Consult for LMWH Indication: superior mesenteric vein thrombosis  No Known Allergies  Patient Measurements: Height: 5\' 5"  (165.1 cm) Weight: 201 lb 14.4 oz (91.581 kg) IBW/kg (Calculated) : 57   Vital Signs: Temp: 98.6 F (37 C) (04/18 1546) Temp Source: Oral (04/18 1546) BP: 140/93 mmHg (04/18 1546) Pulse Rate: 127 (04/18 1546)  Labs:  Recent Labs  08/12/14 0420 08/13/14 0430 08/14/14 0555  HGB  --   --  11.7*  HCT  --   --  35.0*  PLT  --   --  185  CREATININE 0.66 0.62  --     Estimated Creatinine Clearance: 123.3 mL/min (by C-G formula based on Cr of 0.62).   Medical History: Past Medical History  Diagnosis Date  . Anxiety     diagnosed in 2012  . Contraceptive use     Had nexplanon - removed, now on OCP  . Dysmenorrhea     Medications:  Prescriptions prior to admission  Medication Sig Dispense Refill Last Dose  . citalopram (CELEXA) 20 MG tablet Take 20 mg by mouth daily.   08/06/2014 at Unknown time  . Cyanocobalamin (VITAMIN B-12 IJ) Inject 1 application into the skin every Monday, Wednesday, and Friday.   08/05/2014 at Unknown time  . ibuprofen (ADVIL,MOTRIN) 200 MG tablet Take 400 mg by mouth every 6 (six) hours as needed (pain).    few days ago  . phentermine (ADIPEX-P) 37.5 MG tablet Take 37.5 mg by mouth daily.   08/06/2014 at Unknown time  . PRESCRIPTION MEDICATION Inject 1 application into the skin every Monday, Wednesday, and Friday. HCG Injection   08/05/2014 at Unknown time  . PRESCRIPTION MEDICATION Inject 1 application into the skin every Monday, Wednesday, and Friday. Lipotropic injection   08/05/2014 at Unknown time  . rizatriptan (MAXALT) 10 MG tablet Take 10 mg by mouth daily as needed for migraine.    2 months ago    Assessment: [age] yo F with severe gallstone pancreatitis.  Pharmacy consulted to dose LMWH for superior mesenteric vein thrombosis.  Plan for 3 months of LMWH for small  segment thrombus within the superior mesenteric vein, assumed to be reactive in nature due to severe pancreatitis.  Wt 91.6 kg; CBC OK.  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    Goal of Therapy:  Heparin level 0.6 - 1 units/ml Monitor platelets by anticoagulation protocol: Yes   Plan:  -lovenox 1 mg/kg = 90 mg sq q12h, once more stable could transition to once daily dose of 1.5 mg/kg/day for discharge home -f/u renal function, CBC  Herby AbrahamMichelle T. Josely Moffat, Pharm.D. [phone] 08/14/2014 4:57 PM

## 2014-08-14 NOTE — Progress Notes (Addendum)
PARENTERAL NUTRITION CONSULT NOTE - FOLLOW UP  Pharmacy Consult:  TPN Indication: Severe pancreatitis  No Known Allergies  Patient Measurements: Height:  (165.1 cm) Weight: 201 lb 14.4 oz (91.581 kg) IBW/kg (Calculated) : 57 Adjusted Body Weight: 66 kg  Vital Signs: Temp: 98.3 F (36.8 C) (04/18 0530) Temp Source: Axillary (04/18 0530) BP: 136/79 mmHg (04/18 0530) Pulse Rate: 131 (04/18 0530) Intake/Output from previous day: 04/17 0701 - 04/18 0700 In: 1929 [IV Piggyback:100; TPN:1829] Out: 2700 [Urine:2700]  Labs:  Recent Labs  08/14/14 0555  WBC 23.7*  HGB 11.7*  HCT 35.0*  PLT 185     Recent Labs  08/11/14 1624 08/12/14 0420 08/13/14 0430  NA 138 135 132*  K 3.4* 3.7 4.2  CL 101 97 94*  CO2 GLUCOSE 128* 142* 163*  BUN CREATININE 0.59 0.66 0.62  CALCIUM 7.8* 7.7* 7.6*  MG 2.3 1.9 2.0  PHOS 1.7* 2.1* 2.9  PROT  --   --  5.6*  ALBUMIN  --   --  2.1*  AST  --   --  33  ALT  --   --  14  ALKPHOS  --   --  98  BILITOT  --   --  1.1   Estimated Creatinine Clearance: 123.3 mL/min (by C-G formula based on Cr of 0.62).    Recent Labs  08/13/14 1146 08/13/14 1804 08/14/14 0010  GLUCAP 186* 189* 172*    Insulin Requirements in the past 24 hours:  8 units Novolog  Current Nutrition:  NPO Clinimix E 5/15 to 83 ml/min + IVFE to 10 ml/hr.  This will provide 1894 kCal per day and 100g of protein per day to meet 95% of her estimated kCal needs and 100% of her estimated protein needs   Nutritional Goals:  2000-2200 kCal, 100-110 gm of protein per day  Assessment: 23 YOF presents on 08/06/14 with severe abdominal pain.  Initial lipase was over 37,000 with mildly elevated LFTs.  CT showed a stone in the gall bladder.  Surgery would like to start TPN instead of a trial of post-pyloric trickle feed for severe pancreatitis.  Noted patient was trying to lose weight PTA, limited to no PO intake since 08/05/14, and baseline electrolytes are  low.  Concerned with refeeding; thus, TPN initiation was delayed till electrolytes supplemented.  Surgeries/Procedures: possible lap chole when clinically improved.  GI: baseline albumin 2.3 now 2.1, baseline prealbumin low at 7, +BM. CT showed 4mm gallstone which is most likely the cause of pancreatitis. Receiving PRN Phenergan and Zofran. She is passing some flatus. Surgery plans to do lap chole when symptoms improve, likely early next week. Pt continues to have abdominal pain and nausea  Endo: no hx -  CBGs 160-180s after increase in TPN  Lytes: Unable to get new Cmet, Phos, and Mg today. Will follow up labs tomorrow. 4/17 labs showed K+ 4.2, improved after supplementation, Phos 2.9, Mg 2.    Renal: SCr down to 0.62, CrCL >100 ml/min - decent UOP 1.2 ml/kg/hr, IV fluids dc'd. Net negative yesterday  Pulm: remains on 3L [place]  Cards: no hx - BP high normal, persistent tachycardia  Hepatobil: LFTs normalized, TG WNL  Neuro: hx anxiety - Dilaudid PCA  ID: Zosyn D#8 for SIRS/sepsis, afebrile, WBC back up to 23.7  Best Practices: Lovenox, Protonix IV  TPN Access: PICC placed 08/10/14  TPN start date: 4/15 >>  Plan:  - Continue Clinimix  E 5/15 at 83 ml/min + IVFE to 10 ml/hr.  This will provide 1894 kCal per day and 100g of protein per day to meet 95% of her estimated kCal needs and 100% of her estimated protein needs. - Daily multivitamin and trace elements - Change SSI to moderate q4h and add 10 units of regular insulin to TPN - F/U TPN labs, repeat CT  Enzo BiNathan Georgine Wiltse, PharmD Clinical Pharmacist Resident Pager [phone] 08/14/2014 6:54 AM

## 2014-08-14 NOTE — Progress Notes (Signed)
Patient ID: [name] Blanchard, female   DOB: [birth year], 24 y.o.   MRN: [id number]    Subjective: Pt says she feels ok, but appears to have tachypnea.  She states this is from the fact that she is just getting back to bed and is anxious some.  She is on a PCA for her abdominal pain.  Denies nausea.  Passing some flatus.  Objective: Vital signs in last 24 hours: Temp:  [96.6 F (35.9 C)-100.1 F (37.8 C)] 98.3 F (36.8 C) (04/18 0530) Pulse Rate:  [116-131] 116 (04/18 0700) Resp:  [18-32] 31 (04/18 0800) BP: (122-144)/(74-107) 136/79 mmHg (04/18 0530) SpO2:  [96 %-100 %] 99 % (04/18 0800) Last BM Date: 08/13/14  Intake/Output from previous day: 04/17 0701 - 04/18 0700 In: 2022 [IV Piggyback:100; TPN:1922] Out: 2700 [Urine:2700] Intake/Output this shift:    PE: Gen: appears SOB, but sitting up in bed in no obvious distress Heart: tachy, 130-140s Lungs: CTAB, but tachypneic, BPM came down to the low 20s after resting in bed a while, but were in the high 30s initially. Abd: soft, but distended some and full in the upper abdomen.  Tender, but just used PCA so not too tender, hypoactive BS   Lab Results:   Recent Labs  08/14/14 0555  WBC 23.7*  HGB 11.7*  HCT 35.0*  PLT 185   BMET  Recent Labs  08/12/14 0420 08/13/14 0430  NA 135 132*  K 3.7 4.2  CL 97 94*  CO2 27 27  GLUCOSE 142* 163*  BUN 7 6  CREATININE 0.66 0.62  CALCIUM 7.7* 7.6*   PT/INR No results for input(s): LABPROT, INR in the last 72 hours. CMP     Component Value Date/Time   NA 132* 08/13/2014 0430   K 4.2 08/13/2014 0430   CL 94* 08/13/2014 0430   CO2 27 08/13/2014 0430   GLUCOSE 163* 08/13/2014 0430   BUN 6 08/13/2014 0430   CREATININE 0.62 08/13/2014 0430   CALCIUM 7.6* 08/13/2014 0430   PROT 5.6* 08/13/2014 0430   ALBUMIN 2.1* 08/13/2014 0430   AST 33 08/13/2014 0430   ALT 14 08/13/2014 0430   ALKPHOS 98 08/13/2014 0430   BILITOT 1.1 08/13/2014 0430   GFRNONAA >90 08/13/2014 0430   GFRAA  >90 08/13/2014 0430   Lipase     Component Value Date/Time   LIPASE 40 08/13/2014 0430       Studies/Results: No results found.  Anti-infectives: Anti-infectives    Start     Dose/Rate Route Frequency Ordered Stop   08/07/14 0400  piperacillin-tazobactam (ZOSYN) IVPB 3.375 g     3.375 g 12.5 mL/hr over 240 Minutes Intravenous Every 8 hours 08/06/14 1910     08/06/14 1930  piperacillin-tazobactam (ZOSYN) IVPB 3.375 g     3.375 g 12.5 mL/hr over 240 Minutes Intravenous  Once 08/06/14 1909 08/06/14 2340       Assessment/Plan HD #8 Severe acute gallstone pancreatitis (transfer from [facility] ER)  -CT shows single 4mm gallstone, GB wall thickening, pericholecystic fluid, enlarged pancreas with peri-pancreatic edema. Follow up HIDA was negative -lipase 4/13 normal -await improvement in abdominal pain before proceeding with a cholecystectomy, likely early next week.  -NPO -IVF, pain control, antiemetics -On Zosyn 08/06/14 Day #8 ---> see below -patient is tachycardic today in the 130s sustained, appears to be somewhat SOB, but states this is only with moving, only pulls 750 on IS, and WBC increased back to 23.7K.  Her first CT scan was quite impressive.  We will repeat a follow up CT scan today to evaluate for further infection of her pancreas or pseudocyst, etc.  The patient is still not ready for a lap chole at this point.   Leukocytosis -increasing today to 23.7K, on Zosyn, may need to consider switching to Primaxin Transaminitis -resolved Obesity - BMI 35 Severe PCM/TNA -prealbumin 6    LOS: 8 days    Raisa Ditto E 08/14/2014, 8:07 AM Pager: [phone]

## 2014-08-15 DIAGNOSIS — E876 Hypokalemia: Secondary | ICD-10-CM | POA: Insufficient documentation

## 2014-08-15 DIAGNOSIS — I8290 Acute embolism and thrombosis of unspecified vein: Secondary | ICD-10-CM | POA: Insufficient documentation

## 2014-08-15 DIAGNOSIS — R6510 Systemic inflammatory response syndrome (SIRS) of non-infectious origin without acute organ dysfunction: Secondary | ICD-10-CM | POA: Insufficient documentation

## 2014-08-15 DIAGNOSIS — K851 Biliary acute pancreatitis without necrosis or infection: Secondary | ICD-10-CM | POA: Insufficient documentation

## 2014-08-15 DIAGNOSIS — K5909 Other constipation: Secondary | ICD-10-CM | POA: Insufficient documentation

## 2014-08-15 LAB — CBC
HCT: 33.7 % — ABNORMAL LOW (ref 36.0–46.0)
HEMOGLOBIN: 11.2 g/dL — AB (ref 12.0–15.0)
MCH: 29.6 pg (ref 26.0–34.0)
MCHC: 33.2 g/dL (ref 30.0–36.0)
MCV: 88.9 fL (ref 78.0–100.0)
Platelets: 166 10*3/uL (ref 150–400)
RBC: 3.79 MIL/uL — ABNORMAL LOW (ref 3.87–5.11)
RDW: 13.5 % (ref 11.5–15.5)
WBC: 24.6 10*3/uL — ABNORMAL HIGH (ref 4.0–10.5)

## 2014-08-15 LAB — COMPREHENSIVE METABOLIC PANEL
ALBUMIN: 2.1 g/dL — AB (ref 3.5–5.2)
ALT: 16 U/L (ref 0–35)
ANION GAP: 11 (ref 5–15)
AST: 24 U/L (ref 0–37)
Alkaline Phosphatase: 80 U/L (ref 39–117)
BILIRUBIN TOTAL: 0.8 mg/dL (ref 0.3–1.2)
BUN: 9 mg/dL (ref 6–23)
CHLORIDE: 97 mmol/L (ref 96–112)
CO2: 27 mmol/L (ref 19–32)
CREATININE: 0.79 mg/dL (ref 0.50–1.10)
Calcium: 7.9 mg/dL — ABNORMAL LOW (ref 8.4–10.5)
GFR calc Af Amer: >90 mL/min (ref >90)
GFR calc non Af Amer: >90 mL/min (ref >90)
Glucose, Bld: 181 mg/dL — ABNORMAL HIGH (ref 70–99)
Potassium: 3.2 mmol/L — ABNORMAL LOW (ref 3.5–5.1)
Sodium: 135 mmol/L (ref 135–145)
Total Protein: 5.7 g/dL — ABNORMAL LOW (ref 6.0–8.3)

## 2014-08-15 LAB — GLUCOSE, CAPILLARY
GLUCOSE-CAPILLARY: 147 mg/dL — AB (ref 70–99)
GLUCOSE-CAPILLARY: 149 mg/dL — AB (ref 70–99)
GLUCOSE-CAPILLARY: 153 mg/dL — AB (ref 70–99)
GLUCOSE-CAPILLARY: 159 mg/dL — AB (ref 70–99)
Glucose-Capillary: 184 mg/dL — ABNORMAL HIGH (ref 70–99)
Glucose-Capillary: 129 mg/dL — ABNORMAL HIGH (ref 70–99)

## 2014-08-15 LAB — DIFFERENTIAL
Basophils Absolute: 0 10*3/uL (ref 0.0–0.1)
Basophils Relative: 0 % (ref 0–1)
EOS ABS: 0.2 10*3/uL (ref 0.0–0.7)
Eosinophils Relative: 1 % (ref 0–5)
LYMPHS PCT: 7 % — AB (ref 12–46)
Lymphs Abs: 1.7 10*3/uL (ref 0.7–4.0)
Monocytes Absolute: 2 10*3/uL — ABNORMAL HIGH (ref 0.1–1.0)
Monocytes Relative: 8 % (ref 3–12)
NEUTROS ABS: 20.7 10*3/uL — AB (ref 1.7–7.7)
Neutrophils Relative %: 84 % — ABNORMAL HIGH (ref 43–77)

## 2014-08-15 LAB — TRIGLYCERIDES: Triglycerides: 60 mg/dL (ref <150)

## 2014-08-15 LAB — PHOSPHORUS: Phosphorus: 3.1 mg/dL (ref 2.3–4.6)

## 2014-08-15 LAB — MAGNESIUM: Magnesium: 1.8 mg/dL (ref 1.5–2.5)

## 2014-08-15 MED ORDER — POTASSIUM CHLORIDE 10 MEQ/100ML IV SOLN
10.0000 meq | INTRAVENOUS | Status: AC
  Administered 2014-08-15 (×3): 10 meq via INTRAVENOUS
  Filled 2014-08-15 (×3): qty 100

## 2014-08-15 MED ORDER — SODIUM CHLORIDE 0.9 % IV SOLN
500.0000 mg | Freq: Four times a day (QID) | INTRAVENOUS | Status: DC
  Administered 2014-08-15 – 2014-08-16 (×4): 500 mg via INTRAVENOUS
  Filled 2014-08-15 (×6): qty 500

## 2014-08-15 MED ORDER — ENSURE ENLIVE PO LIQD
237.0000 mL | Freq: Two times a day (BID) | ORAL | Status: DC
  Administered 2014-08-16 – 2014-08-22 (×9): 237 mL via ORAL

## 2014-08-15 MED ORDER — FAT EMULSION 20 % IV EMUL
240.0000 mL | INTRAVENOUS | Status: AC
  Administered 2014-08-15: 240 mL via INTRAVENOUS
  Filled 2014-08-15: qty 250

## 2014-08-15 MED ORDER — HYDROMORPHONE HCL 1 MG/ML IJ SOLN
0.5000 mg | INTRAMUSCULAR | Status: DC | PRN
  Administered 2014-08-15 – 2014-08-24 (×58): 1 mg via INTRAVENOUS
  Filled 2014-08-15 (×59): qty 1

## 2014-08-15 MED ORDER — M.V.I. ADULT IV INJ
INTRAVENOUS | Status: AC
  Administered 2014-08-15: 17:00:00 via INTRAVENOUS
  Filled 2014-08-15: qty 1992

## 2014-08-15 NOTE — Progress Notes (Signed)
[place] TEAM 1 - Stepdown/ICU TEAM Progress Note  [name] Blanchard TFT:[id number] DOB: Sep 03, 1990 DOA: 08/06/2014 PCP: No PCP Per Patient  Admit HPI / Brief Narrative: 24 y/o WF PMHx chronic intermittent abdominal pain for 6 months admitted with acute pancreatitis. Patient was transferred from [facility] ER where lipase was reportedly over 37,000, w/ mildly elevated lfts, nml alk phos and tbili; and a CT showing a stone in the gallbladder w/ GB wall thickening + edema around the pancreas w/ cbd dilation but with no stone  HPI/Subjective: 4/19 A/O 4, states her abdominal pain had been down to 2/10 until her diet was advanced to full liquids for lunch now pain back to 5-6/10. States this is the first incidence of pancreatitis  Assessment/Plan:  Acute severe gallstone pancreatitis -Patient with increased pain with advancement of her diet to full liquids will change back to clear liquid diet. -Continued TNA  -Lipase has normalized - Gen Surg following  - follow-up CT scan of the abdomen today raises question of possible partial pancreatic necrosis and persistent/increased peripancreatic inflammatory change - additionally there is a small segment thrombus within the superior mesenteric vein though the portal and splenic veins appear patent  - Dr Thereasa Solo discussed w/ GI on call who agree with me that anticoag is reasonable due to concern that clot could propagate; monitor closely on anticoag as pt is at risk for bleeding   Small segment thrombus within the superior mesenteric vein  -Assumed to be reactive in nature due to severe pancreatitis  - will initiate lovenox for a planned 3 month tx course, w/ f/u imaging while in hospital   Constipation Primarily driven by PCA - resolved  SIRS in the setting of acute pancreatitis -SIRS physiology persists with persistent sinus tachycardia and markedly elevated white blood cell count but pt is hemodynamically stable   Hypokalemia -Corrected with  TNA -Potassium IV 10 mEq 3 runs  Hypophosphatemia -Corrected with TNA  Nutrition -TNA infusing  Obesity - Body mass index is 33.6 kg/(m^2).     Code Status: FULL Family Communication: no family present at time of exam Disposition Plan:     Consultants: Gen Surgery   Procedure/Significant Events: HIDA - 4/13 - unremarkable    Culture   Antibiotics: Zosyn 4/10 >  DVT prophylaxis: SCDs   Devices    LINES / TUBES:      Continuous Infusions: . Marland KitchenTPN (CLINIMIX-E) Adult 83 mL/hr at 08/14/14 1724   And  . fat emulsion 240 mL (08/14/14 1724)    Objective: VITAL SIGNS: Temp: 99.1 F (37.3 C) (04/19 0340) Temp Source: Oral (04/19 0340) BP: 135/74 mmHg (04/19 0340) Pulse Rate: 113 (04/19 0715) SPO2; FIO2:   Intake/Output Summary (Last 24 hours) at 08/15/14 0741 Last data filed at 08/15/14 0700  Gross per 24 hour  Intake 3044.3 ml  Output   2750 ml  Net  294.3 ml     Exam: General: A/O 4, moderate abdominal pain postprandial, No acute respiratory distress Lungs: Clear to auscultation bilaterally without wheezes or crackles Cardiovascular: Tachycardic, Regular rhythm without murmur gallop or rub normal S1 and S2 Abdomen: Moderate tenderness to palpation epigastric > LUQ > LLQ, distended, soft, bowel sounds positive, no rebound, no ascites, no appreciable mass Extremities: No significant cyanosis, clubbing, or edema bilateral lower extremities  Data Reviewed: Basic Metabolic Panel:  Recent Labs Lab 08/11/14 0500 08/11/14 1624 08/12/14 0420 08/13/14 0430 08/15/14 0350  NA 137 138 135 132* 135  K 3.2* 3.4* 3.7 4.2 3.2*  CL 100 101 97 94* 97  CO2 28 28 27 27 27   GLUCOSE 118* 128* 142* 163* 181*  BUN 8 7 7 6 9   CREATININE 0.68 0.59 0.66 0.62 0.79  CALCIUM 7.7* 7.8* 7.7* 7.6* 7.9*  MG 1.8 2.3 1.9 2.0 1.8  PHOS 1.3* 1.7* 2.1* 2.9 3.1   Liver Function Tests:  Recent Labs Lab 08/08/14 1209 08/09/14 0326 08/10/14 0304 08/13/14 0430  08/15/14 0350  AST 34 32 26 33 24  ALT 29 28 23 14 16   ALKPHOS 41 54 56 98 80  BILITOT 0.8 0.8 0.7 1.1 0.8  PROT 6.0 5.9* 5.9* 5.6* 5.7*  ALBUMIN 2.6* 2.3* 2.3* 2.1* 2.1*    Recent Labs Lab 08/08/14 1209 08/09/14 0326 08/10/14 0304 08/13/14 0430  LIPASE 576* 183* 55 40   No results for input(s): AMMONIA in the last 168 hours. CBC:  Recent Labs Lab 08/08/14 0803 08/09/14 0326 08/10/14 0304 08/14/14 0555 08/15/14 0350  WBC 39.2* 28.1* 18.2* 23.7* 24.6*  NEUTROABS  --   --   --  20.4* 20.7*  HGB 16.5* 13.9 12.0 11.7* 11.2*  HCT 48.9* 42.2 36.7 35.0* 33.7*  MCV 92.4 92.5 91.8 89.5 88.9  PLT 291 282 247 185 166   Cardiac Enzymes: No results for input(s): CKTOTAL, CKMB, CKMBINDEX, TROPONINI in the last 168 hours. BNP (last 3 results) No results for input(s): BNP in the last 8760 hours.  ProBNP (last 3 results) No results for input(s): PROBNP in the last 8760 hours.  CBG:  Recent Labs Lab 08/14/14 1139 08/14/14 1545 08/14/14 1957 08/14/14 2355 08/15/14 0357  GLUCAP 192* 186* 184* 147* 159*    Recent Results (from the past 240 hour(s))  MRSA PCR Screening     Status: None   Collection Time: 08/08/14  4:00 PM  Result Value Ref Range Status   MRSA by PCR NEGATIVE NEGATIVE Final    Comment:        The GeneXpert MRSA Assay (FDA approved for NASAL specimens only), is one component of a comprehensive MRSA colonization surveillance program. It is not intended to diagnose MRSA infection nor to guide or monitor treatment for MRSA infections.      Studies:  Recent x-ray studies have been reviewed in detail by the Attending Physician  Scheduled Meds:  Scheduled Meds: . antiseptic oral rinse  7 mL Mouth Rinse q12n4p  . chlorhexidine  15 mL Mouth Rinse BID  . enoxaparin (LOVENOX) injection  1 mg/kg Subcutaneous Q12H  . HYDROmorphone PCA 0.3 mg/mL   Intravenous 6 times per day  . insulin aspart  0-15 Units Subcutaneous 6 times per day  . pantoprazole  (PROTONIX) IV  40 mg Intravenous Q12H  . piperacillin-tazobactam (ZOSYN)  IV  3.375 g Intravenous Q8H  . simethicone  40 mg Oral QID  . sodium chloride  10-40 mL Intracatheter Q12H  . sodium chloride  3 mL Intravenous Q12H    Time spent on care of this patient: 40 mins   Donabelle Molden, Geraldo Docker , MD  [facility] Office  [phone] Pager - [phone]  On-Call/Text Page:      [email or web address]      password [email or web address]  If 7PM-7AM, please contact night-coverage [email or web address] Password [email or web address] 08/15/2014, 7:41 AM   LOS: 9 days   Care during the described time interval was provided by me .  I have reviewed this patient's available data, including medical history, events of note, physical examination, radiology studies and test results as part of my evaluation  Vicente Serene  Sherral Hammers, [place] Pager

## 2014-08-15 NOTE — Progress Notes (Signed)
[facility] Progress Note     Subjective: Pt's pain improving.  No N/V, tolerating clears and actually had apple sauce.  Ambulating OOB.  Had a BM this am and having flatus.    Objective: Vital signs in last 24 hours: Temp:  [97.9 F (36.6 C)-99.2 F (37.3 C)] 99.1 F (37.3 C) (04/19 0340) Pulse Rate:  [113-134] 113 (04/19 0715) Resp:  [16-42] 24 (04/19 0715) BP: (134-142)/(74-93) 135/74 mmHg (04/19 0340) SpO2:  [93 %-100 %] 100 % (04/19 0715) FiO2 (%):  [39 %-98 %] 39 % (04/18 1323) Weight:  [87 kg (191 lb 12.8 oz)] 87 kg (191 lb 12.8 oz) (04/19 0449) Last BM Date: 08/14/14  Intake/Output from previous day: 04/18 0701 - 04/19 0700 In: 3044.3 [P.O.:600; I.V.:0.3; IV Piggyback:150; TPN:2294] Out: 2750 [Urine:2750] Intake/Output this shift:    PE: Gen:  Alert, NAD, pleasant Abd: Soft, mild tenderness in the upper abdomen, still mildly distended, +BS, no HSM, no abdominal scars noted   Lab Results:   Recent Labs  08/14/14 0555 08/15/14 0350  WBC 23.7* 24.6*  HGB 11.7* 11.2*  HCT 35.0* 33.7*  PLT 185 166   BMET  Recent Labs  08/13/14 0430 08/15/14 0350  NA 132* 135  K 4.2 3.2*  CL 94* 97  CO2 27 27  GLUCOSE 163* 181*  BUN 6 9  CREATININE 0.62 0.79  CALCIUM 7.6* 7.9*   PT/INR No results for input(s): LABPROT, INR in the last 72 hours. CMP     Component Value Date/Time   NA 135 08/15/2014 0350   K 3.2* 08/15/2014 0350   CL 97 08/15/2014 0350   CO2 27 08/15/2014 0350   GLUCOSE 181* 08/15/2014 0350   BUN 9 08/15/2014 0350   CREATININE 0.79 08/15/2014 0350   CALCIUM 7.9* 08/15/2014 0350   PROT 5.7* 08/15/2014 0350   ALBUMIN 2.1* 08/15/2014 0350   AST 24 08/15/2014 0350   ALT 16 08/15/2014 0350   ALKPHOS 80 08/15/2014 0350   BILITOT 0.8 08/15/2014 0350   GFRNONAA >90 08/15/2014 0350   GFRAA >90 08/15/2014 0350   Lipase     Component Value Date/Time   LIPASE 40 08/13/2014 0430       Studies/Results: Ct Abdomen Pelvis W  Contrast  08/14/2014   CLINICAL DATA:  Abdominal pain, nausea, history of gallstones and pancreatitis  EXAM: CT ABDOMEN AND PELVIS WITH CONTRAST  TECHNIQUE: Multidetector CT imaging of the abdomen and pelvis was performed using the standard protocol following bolus administration of intravenous contrast. Sagittal and coronal MPR images reconstructed from axial data set.  CONTRAST:  80mL OMNIPAQUE IOHEXOL 300 MG/ML  SOLN IV  COMPARISON:  08/06/2014  FINDINGS: Bibasilar pleural effusions and atelectasis greater on LEFT.  Enlarged edematous pancreas with significant fluid/edema along the pancreatic bed in adjacent to pancreatic tail compatible with acute pancreatitis, changes progressive since previous exam.  A portion of the pancreatic body now is low in attenuation and demonstrates absent enhancement, suspicious for pancreatic necrosis.  Diffuse edema in the anterior para renal spaces extending to the lateral conal fascia bilaterally and the ascending/descending colon with minimal thickening of the wall of the descending colon.  Increased edema extending into small bowel mesentery.  Mild thickening of posterior wall the stomach with minimal fluid in the lesser sac.  Increased edema of the second and third portions of the duodenum.  Small amount of ascites.  Small segment of thrombus within the superior mesenteric vein, extending to near the portal vein confluence though  the portal and splenic veins remain patent.  Dependent gallstone in gallbladder.  Remaining bowel loops unremarkable.  Normal appearing bladder, ureters, uterus and LEFT adnexa.  Small RIGHT ovarian cyst 17 mm diameter.  Edema adjacent to the appendix appears related to diffuse abdominal process noted above.  No mass, adenopathy, or free air.  Osseous structures unremarkable.  IMPRESSION: Progressive changes of acute pancreatitis with a segment of the pancreatic body now demonstrating absent enhancement question pancreatic necrosis.  Slightly  increased peripancreatic inflammatory changes with persistent fluid at the pancreatic bed and adjacent to the pancreatic tail though these collections do not yet appear to be well-marginated pseudocysts at this time.  Small segment of thrombus within the superior mesenteric vein with portal and splenic veins appearing patent.  Bibasilar effusions atelectasis with small amount of free intraperitoneal fluid.  Findings called to Barnetta Chapel PA on 08/14/2014 at 1435 hours.   Electronically Signed   By: Ulyses Southward M.D.   On: 08/14/2014 14:37    Anti-infectives: Anti-infectives    Start     Dose/Rate Route Frequency Ordered Stop   08/07/14 0400  piperacillin-tazobactam (ZOSYN) IVPB 3.375 g     3.375 g 12.5 mL/hr over 240 Minutes Intravenous Every 8 hours 08/06/14 1910     08/06/14 1930  piperacillin-tazobactam (ZOSYN) IVPB 3.375 g     3.375 g 12.5 mL/hr over 240 Minutes Intravenous  Once 08/06/14 1909 08/06/14 2340       Assessment/Plan HD #9 Severe acute gallstone pancreatitis (transfer from [facility] ER)  -CT shows single 4mm gallstone, GB wall thickening, pericholecystic fluid, enlarged pancreas with peri-pancreatic edema. Follow up HIDA was negative -Lipase 4/13 normal, all other LFT's normalized as well -Repeat CT 08/14/14 shows progressive changes of acute pancreatitis with pancreatic necrosis and a small segment thrombus SMV with portal and splenic veins patent -Tolerated clears, advance to fulls.  If she does not tolerate diet may need PANDA tube.  Ordered dietitian -IVF, pain control, antiemetics -On Zosyn 08/06/14 Day #9 ---> see below -D/c PCA and start prn dilaudid, wean to orals tomorrow if doing well -Patient is less tachycardic today in the 113-128 sustained, appears to be somewhat SOB, but states this is only with moving, only pulls 750 on IS, and WBC increased back to 24.6K.  -Surgery may need to be postponed several weeks to months until the pancreas is healed and she can  safely undergo lap chole. Leukocytosis -increasing today to 24.6K, will switch Zosyn to Primaxin Transaminitis -resolved Small segment thrombus within the superior mesenteric vein  -Dr. Sharon Seller started 3 mo tx lovenox, Dr. Joseph Art taking over for him today, Dr. 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

## 2014-08-15 NOTE — Progress Notes (Signed)
PARENTERAL NUTRITION CONSULT NOTE - FOLLOW UP  Pharmacy Consult:  TPN Indication: Severe pancreatitis  No Known Allergies  Patient Measurements: Height: 5\' 5"  (165.1 cm) Weight: 191 lb 12.8 oz (87 kg) IBW/kg (Calculated) : 57 Adjusted Body Weight: 66 kg  Vital Signs: Temp: 99.1 F (37.3 C) (04/19 0340) Temp Source: Oral (04/19 0340) BP: 135/74 mmHg (04/19 0340) Pulse Rate: 123 (04/19 0340) Intake/Output from previous day: 04/18 0701 - 04/19 0700 In: 2409.3 [P.O.:480; I.V.:0.3; IV Piggyback:100; TPN:1829] Out: 2300 [Urine:2300]  Labs:  Recent Labs  08/14/14 0555 08/15/14 0350  WBC 23.7* 24.6*  HGB 11.7* 11.2*  HCT 35.0* 33.7*  PLT 185 166     Recent Labs  08/13/14 0430 08/15/14 0350  NA 132* 135  K 4.2 3.2*  CL 94* 97  CO2 27 27  GLUCOSE 163* 181*  BUN 6 9  CREATININE 0.62 0.79  CALCIUM 7.6* 7.9*  MG 2.0 1.8  PHOS 2.9 3.1  PROT 5.6* 5.7*  ALBUMIN 2.1* 2.1*  AST 33 24  ALT 14 16  ALKPHOS 98 80  BILITOT 1.1 0.8  TRIG  --  60   Estimated Creatinine Clearance: 120.2 mL/min (by C-G formula based on Cr of 0.79).    Recent Labs  08/14/14 1957 08/14/14 2355 08/15/14 0357  GLUCAP 184* 147* 159*    Insulin Requirements in the past 24 hours:  13 units Novolog and 10 units of regular insulin in TPN  Current Nutrition:  Clear liquid Clinimix E 5/15 to 83 ml/min + IVFE to 10 ml/hr.  This will provide 1894 kCal per day and 100g of protein per day to meet 95% of her estimated kCal needs and 100% of her estimated protein needs   Nutritional Goals:  2000-2200 kCal, 100-110 gm of protein per day  Assessment: 23 YOF presents on 08/06/14 with severe abdominal pain.  Initial lipase was over 37,000 with mildly elevated LFTs.  CT showed a stone in the gall bladder.  Surgery would like to start TPN instead of a trial of post-pyloric trickle feed for severe pancreatitis.  Noted patient was trying to lose weight PTA, limited to no PO intake since 08/05/14, and  baseline electrolytes are low.  Concerned with refeeding; thus, TPN initiation was delayed till electrolytes supplemented.  Surgeries/Procedures: possible lap chole when clinically improved.  GI: baseline albumin 2.3 now 2.1, baseline prealbumin low at 7, +BM. Receiving PRN Phenergan and Zofran. CT on 4/18 showed progressive changes of acute pancreatitis and small segment thrombus within the superior mesenteric vein. Surgery plans to do lap chole when symptoms improve, likely early next week. Pts abdominal pain and nausea improving. Reports BM this am with flatus. Advancing diet to full liquids today.   Endo: no hx -  CBGs 140-180s after adding regular insulin to TPN.  Lytes: All WNL exc K 3.2, CoCa 9.4   Renal: SCr down to 0.79, CrCL >100 ml/min - decent UOP 1.1 ml/kg/hr. Net equal yesterday  Pulm: remains on 3L [place]  Cards: no hx - BP wnl, persistent tachycardia  Hepatobil: LFTs normalized, TG WNL  Neuro: hx anxiety - Dilaudid PCA  ID: [id number] for possible necrotizing pancreatitis. Zosyn switched to Imipenem. Afebrile, WBC back up to 24.6  Best Practices: Treatment Lovenox for new thrombosis, Protonix IV  TPN Access: PICC placed 08/10/14  TPN start date: 4/15 >>  Plan:  - Continue Clinimix E 5/15 at 83 ml/min + IVFE to 10 ml/hr.  This will provide 1894 kCal per day and 100g of  protein per day to meet 95% of her estimated kCal needs and 100% of her estimated protein needs. - Will try to wean TPN as patient tolerates full liquids diet - Daily multivitamin and trace elements - Continue moderate SSI q4h and increase to 15 units of regular insulin in TPN - Give K x 3 runs per MD - F/U TPN labs, PO intake and necessity of starting tube feeding, Bmet  Enzo Bi, PharmD Clinical Pharmacist Resident Pager [phone] 08/15/2014 7:02 AM

## 2014-08-15 NOTE — Progress Notes (Signed)
ANTIBIOTIC CONSULT NOTE - INITIAL  Pharmacy Consult for Primaxin Indication: necrotizing pancreatitis  No Known Allergies  Patient Measurements: Height: 5\' 5"  (165.1 cm) Weight: 191 lb 12.8 oz (87 kg) IBW/kg (Calculated) : 57  Vital Signs: Temp: 98.3 F (36.8 C) (04/19 0752) Temp Source: Axillary (04/19 0752) BP: 142/80 mmHg (04/19 0752) Pulse Rate: 128 (04/19 0752) Intake/Output from previous day: 04/18 0701 - 04/19 0700 In: 3044.3 [P.O.:600; I.V.:0.3; IV Piggyback:150; TPN:2294] Out: 2750 [Urine:2750] Intake/Output from this shift: Total I/O In: -  Out: 250 [Urine:250]  Labs:  Recent Labs  08/13/14 0430 08/14/14 0555 08/15/14 0350  WBC  --  23.7* 24.6*  HGB  --  11.7* 11.2*  PLT  --  185 166  CREATININE 0.62  --  0.79   Estimated Creatinine Clearance: 120.2 mL/min (by C-G formula based on Cr of 0.79). No results for input(s): VANCOTROUGH, VANCOPEAK, VANCORANDOM, GENTTROUGH, GENTPEAK, GENTRANDOM, TOBRATROUGH, TOBRAPEAK, TOBRARND, AMIKACINPEAK, AMIKACINTROU, AMIKACIN in the last 72 hours.   Microbiology: Recent Results (from the past 720 hour(s))  MRSA PCR Screening     Status: None   Collection Time: 08/08/14  4:00 PM  Result Value Ref Range Status   MRSA by PCR NEGATIVE NEGATIVE Final    Comment:        The GeneXpert MRSA Assay (FDA approved for NASAL specimens only), is one component of a comprehensive MRSA colonization surveillance program. It is not intended to diagnose MRSA infection nor to guide or monitor treatment for MRSA infections.     Medical History: Past Medical History  Diagnosis Date  . Anxiety     diagnosed in 2012  . Contraceptive use     Had nexplanon - removed, now on OCP  . Dysmenorrhea     Medications:  Scheduled:  . antiseptic oral rinse  7 mL Mouth Rinse q12n4p  . chlorhexidine  15 mL Mouth Rinse BID  . enoxaparin (LOVENOX) injection  1 mg/kg Subcutaneous Q12H  . imipenem-cilastatin  500 mg Intravenous 4 times per  day  . insulin aspart  0-15 Units Subcutaneous 6 times per day  . pantoprazole (PROTONIX) IV  40 mg Intravenous Q12H  . potassium chloride  10 mEq Intravenous Q1 Hr x 3  . simethicone  40 mg Oral QID  . sodium chloride  10-40 mL Intracatheter Q12H  . sodium chloride  3 mL Intravenous Q12H   Assessment: 24 y.o. female well known to pharmacy from TPN and anticoagulation dosing. Pt is currently on Day #9 of Zosyn. Afeb. WBC up to 24.6. Changing Zosyn to Primaxin today. SCr remains stable 0.59, est CrCl > 100 ml/min. No cx.  Goal of Therapy:  Resolution of infection  Plan:  Primaxin 500mg  IV q6h Will f/u renal function and pt's clinical condition  Christoper Fabianaron Summer Parthasarathy, PharmD, BCPS Clinical pharmacist, pager [phone] 08/15/2014,8:22 AM

## 2014-08-15 NOTE — Progress Notes (Signed)
PCA pump discontinued. 5cc of Dilaudid wasted in sink, witnessed by another RN Gerald Dexter(Tata Carbone) Lorin PicketLindsey Waldron Gerry, RN

## 2014-08-15 NOTE — Progress Notes (Signed)
NUTRITION FOLLOW UP Intervention:  TPN per pharmacy Ensure Enlive po BID, each supplement provides 350 kcal and 20 grams of protein RD to follow for nutrition care plan  New Nutrition Dx: Increased nutrient needs related to necrotizing pancreatitis as evidenced by estimated nutrition needs, ongoing  Goal: Pt to meet >/= 90% of their estimated nutrition needs, met  Monitor:  TPN prescription, PO intake, weight, labs, I/O's  ASSESSMENT: 24 y/o Female with with chronic intermittent abdominal pains for 6 month admitted with acute pancreatitis. Patient was transferred from [facility] ER where lipase was reportedly over 37,000, w/ mildly elevated lfts, nml alk phos and tbili; CT showing a stone in the gallbladder w/ GB wall thickening + edema around the pancreas w/ cbd dilation but with no stone.  RD re-consulted 4/19.  Pt starting Full Liquid diet today.  May need short term enteral nutrition support.  Patient is receiving TPN with Clinimix E 5/15 @ 83 ml/hr and lipids @ 10 ml/hr. Provides 1894 kcal and 100 grams protein per day. Meets 95% minimum estimated energy needs and 100% minimum estimated protein needs.  Patient reports she doesn't really feel like eating.  No nausea of vomiting.  Willing to try Ensure Enlive oral nutrition supplement.  RD to order.   Height: Ht Readings from Last 1 Encounters:  08/08/14 _0  (1.651 m)    Weight: Wt Readings from Last 1 Encounters:  08/15/14 191 lb 12.8 oz (87 kg)    4/17  201 lb 4/12  201 lb 4/12  192 lb 4/11  188 lb  BMI:  Body mass index is 31.92 kg/(m^2).  Estimated Nutritional Needs: Kcal: 2000-2200 Protein: 100-110 gm Fluid: 2.0-2.2 L  Skin: Intact  Diet Order: .TPN (CLINIMIX-E) Adult Diet full liquid Room service appropriate?: Yes; Fluid consistency:: Thin .TPN (CLINIMIX-E) Adult   Intake/Output Summary (Last 24 hours) at 08/15/14 1044 Last data filed at 08/15/14 0844  Gross per 24 hour  Intake 3284.3 ml  Output    2900 ml  Net  384.3 ml    Labs:   Recent Labs Lab 08/12/14 0420 08/13/14 0430 08/15/14 0350  NA 135 132* 135  K 3.7 4.2 3.2*  CL 97 94* 97  CO2 _1 BUN _2 CREATININE 0.66 0.62 0.79  CALCIUM 7.7* 7.6* 7.9*  MG 1.9 2.0 1.8  PHOS 2.1* 2.9 3.1  GLUCOSE 142* 163* 181*    Scheduled Meds: . antiseptic oral rinse  7 mL Mouth Rinse q12n4p  . chlorhexidine  15 mL Mouth Rinse BID  . enoxaparin (LOVENOX) injection  1 mg/kg Subcutaneous Q12H  . imipenem-cilastatin  500 mg Intravenous 4 times per day  . insulin aspart  0-15 Units Subcutaneous 6 times per day  . pantoprazole (PROTONIX) IV  40 mg Intravenous Q12H  . potassium chloride  10 mEq Intravenous Q1 Hr x 3  . simethicone  40 mg Oral QID  . sodium chloride  10-40 mL Intracatheter Q12H  . sodium chloride  3 mL Intravenous Q12H    Continuous Infusions: . Marland KitchenTPN (CLINIMIX-E) Adult 83 mL/hr at 08/14/14 1724   And  . fat emulsion 240 mL (08/14/14 1724)  . Marland KitchenTPN (CLINIMIX-E) Adult     And  . fat emulsion      Past Medical History  Diagnosis Date  . Anxiety     diagnosed in 2012  . Contraceptive use     Had nexplanon - removed, now on OCP  . Dysmenorrhea  Past Surgical History  Procedure Laterality Date  . Knee surgery Right 09/10/2010    Dr. Berenice Primas  . Tonsillectomy      24 y/o  . Adenoidectomy      24y/o  . Wisdom tooth extraction      unsure if 2 or 4 teeth    Arthur Holms, RD, LDN Pager #: [phone] After-Hours Pager #: [phone]

## 2014-08-16 LAB — GLUCOSE, CAPILLARY
GLUCOSE-CAPILLARY: 168 mg/dL — AB (ref 70–99)
Glucose-Capillary: 149 mg/dL — ABNORMAL HIGH (ref 70–99)
Glucose-Capillary: 155 mg/dL — ABNORMAL HIGH (ref 70–99)
Glucose-Capillary: 156 mg/dL — ABNORMAL HIGH (ref 70–99)
Glucose-Capillary: 157 mg/dL — ABNORMAL HIGH (ref 70–99)
Glucose-Capillary: 166 mg/dL — ABNORMAL HIGH (ref 70–99)
Glucose-Capillary: 91 mg/dL (ref 70–99)

## 2014-08-16 LAB — BASIC METABOLIC PANEL
Anion gap: 9 (ref 5–15)
BUN: 8 mg/dL (ref 6–23)
CHLORIDE: 99 mmol/L (ref 96–112)
CO2: 26 mmol/L (ref 19–32)
CREATININE: 0.65 mg/dL (ref 0.50–1.10)
Calcium: 8.1 mg/dL — ABNORMAL LOW (ref 8.4–10.5)
GFR calc Af Amer: >90 mL/min (ref >90)
GFR calc non Af Amer: >90 mL/min (ref >90)
GLUCOSE: 148 mg/dL — AB (ref 70–99)
POTASSIUM: 3.8 mmol/L (ref 3.5–5.1)
Sodium: 134 mmol/L — ABNORMAL LOW (ref 135–145)

## 2014-08-16 LAB — PREALBUMIN: Prealbumin: 6 mg/dL — ABNORMAL LOW (ref 17–34)

## 2014-08-16 MED ORDER — TRACE MINERALS CR-CU-F-FE-I-MN-MO-SE-ZN IV SOLN
INTRAVENOUS | Status: DC
  Filled 2014-08-16: qty 1440

## 2014-08-16 MED ORDER — ACETAMINOPHEN 325 MG PO TABS
650.0000 mg | ORAL_TABLET | Freq: Four times a day (QID) | ORAL | Status: DC | PRN
  Administered 2014-08-16: 650 mg via ORAL
  Filled 2014-08-16: qty 2

## 2014-08-16 MED ORDER — POLYETHYLENE GLYCOL 3350 17 G PO PACK
17.0000 g | PACK | Freq: Once | ORAL | Status: AC
  Administered 2014-08-16: 17 g via ORAL
  Filled 2014-08-16: qty 1

## 2014-08-16 MED ORDER — DOCUSATE SODIUM 50 MG/5ML PO LIQD
100.0000 mg | Freq: Two times a day (BID) | ORAL | Status: DC
  Administered 2014-08-16 – 2014-08-18 (×4): 100 mg via ORAL
  Filled 2014-08-16 (×7): qty 10

## 2014-08-16 MED ORDER — INSULIN ASPART 100 UNIT/ML ~~LOC~~ SOLN
0.0000 [IU] | Freq: Three times a day (TID) | SUBCUTANEOUS | Status: DC
  Administered 2014-08-16: 3 [IU] via SUBCUTANEOUS
  Administered 2014-08-17 – 2014-08-19 (×4): 2 [IU] via SUBCUTANEOUS

## 2014-08-16 MED ORDER — FAT EMULSION 20 % IV EMUL
240.0000 mL | INTRAVENOUS | Status: DC
  Filled 2014-08-16: qty 250

## 2014-08-16 NOTE — Progress Notes (Addendum)
Subjective: Small emesis yesterday after some soup but otherwise tolerated clear liquids just fine, passing flatus and having some small bms, pain worse overnight but better this am, voiding a lot  Objective: Vital signs in last 24 hours: Temp:  [98 F (36.7 C)-98.6 F (37 C)] 98.6 F (37 C) (04/20 0826) Pulse Rate:  [108-136] 127 (04/20 0826) Resp:  [19-34] 26 (04/20 0826) BP: (97-147)/(53-84) 97/78 mmHg (04/20 0826) SpO2:  [96 %-100 %] 96 % (04/20 0826) Weight:  [86.3 kg (190 lb 4.1 oz)] 86.3 kg (190 lb 4.1 oz) (04/20 0600) Last BM Date: 08/15/14  Intake/Output from previous day: 04/19 0701 - 04/20 0700 In: 3697.4 [P.O.:840; I.V.:13; IV Piggyback:600; TPN:2244.4] Out: 3645 [Urine:3625; Emesis/NG output:20] Intake/Output this shift: Total I/O In: 93 [TPN:93] Out: -   General appearance: no distress Resp: diminished breath sounds bibasilar Cardio: tachy rr GI: mild tender upper abdomen, bs present  Lab Results:   Recent Labs  08/14/14 0555 08/15/14 0350  WBC 23.7* 24.6*  HGB 11.7* 11.2*  HCT 35.0* 33.7*  PLT 185 166   BMET  Recent Labs  08/15/14 0350 08/16/14 0620  NA 135 134*  K 3.2* 3.8  CL 97 99  CO2 27 26  GLUCOSE 181* 148*  BUN 9 8  CREATININE 0.79 0.65  CALCIUM 7.9* 8.1*   PT/INR No results for input(s): LABPROT, INR in the last 72 hours. ABG No results for input(s): PHART, HCO3 in the last 72 hours.  Invalid input(s): PCO2, PO2  Studies/Results: Ct Abdomen Pelvis W Contrast  08/14/2014   CLINICAL DATA:  Abdominal pain, nausea, history of gallstones and pancreatitis  EXAM: CT ABDOMEN AND PELVIS WITH CONTRAST  TECHNIQUE: Multidetector CT imaging of the abdomen and pelvis was performed using the standard protocol following bolus administration of intravenous contrast. Sagittal and coronal MPR images reconstructed from axial data set.  CONTRAST:  80mL OMNIPAQUE IOHEXOL 300 MG/ML  SOLN IV  COMPARISON:  08/06/2014  FINDINGS: Bibasilar pleural  effusions and atelectasis greater on LEFT.  Enlarged edematous pancreas with significant fluid/edema along the pancreatic bed in adjacent to pancreatic tail compatible with acute pancreatitis, changes progressive since previous exam.  A portion of the pancreatic body now is low in attenuation and demonstrates absent enhancement, suspicious for pancreatic necrosis.  Diffuse edema in the anterior para renal spaces extending to the lateral conal fascia bilaterally and the ascending/descending colon with minimal thickening of the wall of the descending colon.  Increased edema extending into small bowel mesentery.  Mild thickening of posterior wall the stomach with minimal fluid in the lesser sac.  Increased edema of the second and third portions of the duodenum.  Small amount of ascites.  Small segment of thrombus within the superior mesenteric vein, extending to near the portal vein confluence though the portal and splenic veins remain patent.  Dependent gallstone in gallbladder.  Remaining bowel loops unremarkable.  Normal appearing bladder, ureters, uterus and LEFT adnexa.  Small RIGHT ovarian cyst 17 mm diameter.  Edema adjacent to the appendix appears related to diffuse abdominal process noted above.  No mass, adenopathy, or free air.  Osseous structures unremarkable.  IMPRESSION: Progressive changes of acute pancreatitis with a segment of the pancreatic body now demonstrating absent enhancement question pancreatic necrosis.  Slightly increased peripancreatic inflammatory changes with persistent fluid at the pancreatic bed and adjacent to the pancreatic tail though these collections do not yet appear to be well-marginated pseudocysts at this time.  Small segment of thrombus within the superior mesenteric  vein with portal and splenic veins appearing patent.  Bibasilar effusions atelectasis with small amount of free intraperitoneal fluid.  Findings called to Barnetta Chapel PA on 08/14/2014 at 1435 hours.    Electronically Signed   By: Ulyses Southward M.D.   On: 08/14/2014 14:37    Anti-infectives: Anti-infectives    Start     Dose/Rate Route Frequency Ordered Stop   08/15/14 0900  imipenem-cilastatin (PRIMAXIN) 500 mg in sodium chloride 0.9 % 100 mL IVPB     500 mg 200 mL/hr over 30 Minutes Intravenous 4 times per day 08/15/14 0821     08/07/14 0400  piperacillin-tazobactam (ZOSYN) IVPB 3.375 g  Status:  Discontinued     3.375 g 12.5 mL/hr over 240 Minutes Intravenous Every 8 hours 08/06/14 1910 08/15/14 0816   08/06/14 1930  piperacillin-tazobactam (ZOSYN) IVPB 3.375 g     3.375 g 12.5 mL/hr over 240 Minutes Intravenous  Once 08/06/14 1909 08/06/14 2340      Assessment/Plan: 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 Neuro- continue narcotics as she needs some but I think attempting to minimize them would be better for her, would consider toradol as option also, once she tolerates po can transition to po regimen 2. CV- tachycardia stable 3. Pulm- cont o2, monitor effusions needs pulm toilet and up oob 4. GI- continue liquids today and we can see how ensure goes, if this does well would really like to get her off the tna.  If cannot take in oral adequately I would consider stopping tna and feeding enterally.  Mortality and infections are increased in pancreatitis with tna, would repeat ct scan next week to follow this, lap chole will be delayed due to severe pancreatitis 5. Renal- cr normal 6. Heme- I would not anticoagulate her for smv thrombus seen.  I would ask GI for formal consult on this and for them to plan follow up if this indeed is the case 7. ID- I dont think she currently has indication for continued abx either, she has what appears to be sterile necrosis. i would reserve abx for clinical deterioration or change 8. Consider tx to floor       [facility] 08/16/2014

## 2014-08-16 NOTE — Progress Notes (Signed)
[place] TEAM 1 - Stepdown/ICU TEAM Progress Note  [name] Blanchard QBV:[id number] DOB: Feb 23, 1991 DOA: 08/06/2014 PCP: No PCP Per Patient  Admit HPI / Brief Narrative: 24 y/o female with with chronic intermittent abdominal pain for 6 months admitted with acute pancreatitis.  Patient was transferred from [facility] ER where lipase was reportedly over 37,000, w/ mildly elevated lfts, nml alk phos and tbili; and a CT showing a stone in the gallbladder w/ GB wall thickening + edema around the pancreas w/ cbd dilation but with no stone.   HPI/Subjective: Patient is tolerating sips of ensure today.  She states she feels much better in general.  She denies any present nausea or vomiting.  She states her pain is reasonably well controlled.  She denies shortness of breath.  Assessment/Plan:  Acute severe gallstone pancreatitis Lipase has normalized - Gen Surg following - follow-up CT noted partial pancreatic necrosis and persistent/increased peripancreatic inflammatory change - additionally noted was a small segment thrombus within the superior mesenteric vein though the portal and splenic veins appeared patent - the pt continues to have pain but is slowly improving overall - she is currently tolerating a clear liquid diet - agree fully w/ stopping TNA as soon as possible, as well as abx - follow WBC - will need f/u CT early next week - if oral intake is not deemed to be sufficient over next 24-48 hours we will need to have a feeding tube placed - wean from PCA as soon as possible  Small segment thrombus within the superior mesenteric vein  Assumed to be reactive in nature due to severe pancreatitis - discussed today w/ Hematology who agree that latest studies have concluded that short term treatment results in the best outcome for the pt by significantly lowering risk of progression of thrombus to complete occlusion as well as further propagation resulting in bowel ischemia or organ damage - will plan for a  3 month course as the cause is clearly identifiable/reversible (pancreatitis) - this tx could be interrupted for GB removal w/o need for lovenox overlap as a NOAC will be the tx of choice as oral intake becomes more reliable   Constipation Primarily driven by PCA - resolved at present  SIRS in the setting of acute pancreatitis SIRS physiology persists with persistent sinus tachycardia and markedly elevated white blood cell count but pt is hemodynamically stable - fever curve is slowly trending up or - I suspect this is related to noninfectious inflammation but we will need to follow closely, especially with antibiotic being discontinued today  Hypokalemia Corrected with TNA - follow as we transition to oral intake only  Hypophosphatemia Corrected with TNA - follow as we transition to oral intake only  Nutrition TNA to be discontinued today - advance to full liquids - follow intake - if fails full liquid, plan will be to have temp feeding tube placed   Obesity - Body mass index is 31.66 kg/(m^2).   Code Status: FULL Family Communication: Spoke with patient and grandparents at bedside  Disposition Plan: Stable for transfer to medical bed - follow for signs and symptoms of infection - advance diet - closely monitor on anticoagulation - repeat CT scan early next week - ambulate - wean from PCA as soon as possible  Consultants: Gen Surgery   Procedures: HIDA - 4/13 - unremarkable   Antibiotics: Zosyn 4/10 > 4/18 Primaxin 4/19  DVT prophylaxis: lovenox  Objective: Blood pressure 97/78, pulse 127, temperature 98.6 F (37 C), temperature source Axillary,  resp. rate 26, height 5' 5"  (1.651 m), weight 86.3 kg (190 lb 4.1 oz), last menstrual period 08/06/2014, SpO2 96 %.  Intake/Output Summary (Last 24 hours) at 08/16/14 1151 Last data filed at 08/16/14 0930  Gross per 24 hour  Intake 3018.4 ml  Output   2795 ml  Net  223.4 ml   Exam: General: No acute respiratory distress -  alert and oriented - comfortable Lungs: Clear to auscultation bilaterally without wheezes or crackles Cardiovascular: tachycardic but regular - no murmur  Abdomen: Obese, soft, bs are hypoactive but present, no appreciable mass w/ gentle palpation  Extremities: No significant cyanosis, or clubbing;  1+ edema bilateral lower extremities   Data Reviewed: Basic Metabolic Panel:  Recent Labs Lab 08/11/14 0500 08/11/14 1624 08/12/14 0420 08/13/14 0430 08/15/14 0350 08/16/14 0620  NA 137 138 135 132* 135 134*  K 3.2* 3.4* 3.7 4.2 3.2* 3.8  CL 100 101 97 94* 97 99  CO2 28 28 27 27 27 26   GLUCOSE 118* 128* 142* 163* 181* 148*  BUN 8 7 7 6 9 8   CREATININE 0.68 0.59 0.66 0.62 0.79 0.65  CALCIUM 7.7* 7.8* 7.7* 7.6* 7.9* 8.1*  MG 1.8 2.3 1.9 2.0 1.8  --   PHOS 1.3* 1.7* 2.1* 2.9 3.1  --     Liver Function Tests:  Recent Labs Lab 08/10/14 0304 08/13/14 0430 08/15/14 0350  AST 26 33 24  ALT 23 14 16   ALKPHOS 56 98 80  BILITOT 0.7 1.1 0.8  PROT 5.9* 5.6* 5.7*  ALBUMIN 2.3* 2.1* 2.1*    Recent Labs Lab 08/10/14 0304 08/13/14 0430  LIPASE 55 40   CBC:  Recent Labs Lab 08/10/14 0304 08/14/14 0555 08/15/14 0350  WBC 18.2* 23.7* 24.6*  NEUTROABS  --  20.4* 20.7*  HGB 12.0 11.7* 11.2*  HCT 36.7 35.0* 33.7*  MCV 91.8 89.5 88.9  PLT 247 185 166    Recent Results (from the past 240 hour(s))  MRSA PCR Screening     Status: None   Collection Time: 08/08/14  4:00 PM  Result Value Ref Range Status   MRSA by PCR NEGATIVE NEGATIVE Final    Comment:        The GeneXpert MRSA Assay (FDA approved for NASAL specimens only), is one component of a comprehensive MRSA colonization surveillance program. It is not intended to diagnose MRSA infection nor to guide or monitor treatment for MRSA infections.      Studies:   Recent x-ray studies have been reviewed in detail by the Attending Physician  Scheduled Meds:  Scheduled Meds: . antiseptic oral rinse  7 mL Mouth  Rinse q12n4p  . chlorhexidine  15 mL Mouth Rinse BID  . docusate  100 mg Oral BID  . enoxaparin (LOVENOX) injection  1 mg/kg Subcutaneous Q12H  . feeding supplement (ENSURE ENLIVE)  237 mL Oral BID BM  . imipenem-cilastatin  500 mg Intravenous 4 times per day  . insulin aspart  0-15 Units Subcutaneous 6 times per day  . pantoprazole (PROTONIX) IV  40 mg Intravenous Q12H  . simethicone  40 mg Oral QID  . sodium chloride  10-40 mL Intracatheter Q12H  . sodium chloride  3 mL Intravenous Q12H    Time spent on care of this patient: 35 mins   Johnita Palleschi T , MD   [facility] Office  [phone] Pager - Text Page per Shea Evans as per below:  On-Call/Text Page:      [email or web address]  password [email or web address]  If 7PM-7AM, please contact night-coverage [email or web address] Password [email or web address] 08/16/2014, 11:51 AM   LOS: 10 days

## 2014-08-16 NOTE — Progress Notes (Signed)
Attempted to call report to RN on 6N. RN busy and told that she would call back. Will try again in 10 - 15 minutes. Lorin PicketLindsey Halaina Vanduzer, RN

## 2014-08-16 NOTE — Progress Notes (Addendum)
PARENTERAL NUTRITION CONSULT NOTE - FOLLOW UP  Pharmacy Consult:  TPN Indication: Severe pancreatitis  No Known Allergies  Patient Measurements: Height: 5\' 5"  (165.1 cm) Weight: 190 lb 4.1 oz (86.3 kg) IBW/kg (Calculated) : 57 Adjusted Body Weight: 66 kg  Vital Signs: Temp: 98 F (36.7 C) (04/20 0323) Temp Source: Oral (04/20 0323) BP: 139/84 mmHg (04/20 0600) Pulse Rate: 108 (04/20 0600) Intake/Output from previous day: 04/19 0701 - 04/20 0700 In: 2660.4 [P.O.:840; I.V.:13; IV Piggyback:400; TPN:1407.4] Out: 3645 [Urine:3625; Emesis/NG output:20]  Labs:  Recent Labs  08/14/14 0555 08/15/14 0350  WBC 23.7* 24.6*  HGB 11.7* 11.2*  HCT 35.0* 33.7*  PLT 185 166     Recent Labs  08/15/14 0350  NA 135  K 3.2*  CL 97  CO2 27  GLUCOSE 181*  BUN 9  CREATININE 0.79  CALCIUM 7.9*  MG 1.8  PHOS 3.1  PROT 5.7*  ALBUMIN 2.1*  AST 24  ALT 16  ALKPHOS 80  BILITOT 0.8  PREALBUMIN 6*  TRIG 60   Estimated Creatinine Clearance: 119.6 mL/min (by C-G formula based on Cr of 0.79).    Recent Labs  08/15/14 2037 08/16/14 0034 08/16/14 0325  GLUCAP 168* 155* 166*    Insulin Requirements in the past 24 hours:  16 units Novolog and 15 units of regular insulin in TPN  Current Nutrition:  Full liquids Clinimix E 5/15 to 83 ml/min + IVFE to 10 ml/hr.  This will provide 1894 kCal per day and 100g of protein per day to meet 95% of her estimated kCal needs and 100% of her estimated protein needs Ensure Enlive PO BID (350kcal and 20 g of protein per dose) - Patient has been refusing   Nutritional Goals:  2000-2200 kCal, 100-110 gm of protein per day. 2.0-2.2 L of fluid  Assessment: 23 YOF presents on 08/06/14 with severe abdominal pain.  Initial lipase was over 37,000 with mildly elevated LFTs.  CT showed a stone in the gall bladder.  Surgery would like to start TPN instead of a trial of post-pyloric trickle feed for severe pancreatitis.  Noted patient was trying to  lose weight PTA, limited to no PO intake since 08/05/14, and baseline electrolytes are low.  Concerned with refeeding; thus, TPN initiation was delayed till electrolytes supplemented.  Surgeries/Procedures: possible lap chole when clinically improved.  GI: baseline albumin 2.3 now 2.1, baseline prealbumin low at 7, remains low at 6 on 4/18. +BM. Receiving PRN Phenergan and Zofran. CT on 4/18 showed progressive changes of acute pancreatitis and small segment thrombus within the superior mesenteric vein. Surgery plans to do lap chole when symptoms improve, likely early next week. Appetite on full liquids was poor yesterday and pt reported she had abdominal discomfort and felt she was moving too quickly with her diet. Seems to be tolerating clear liquids well this am.  Endo: no hx -  CBGs 120-160s.  Lytes: Na 134, K 3.8, CoCa 9.6   Renal: SCr down to 0.79, CrCL >100 ml/min - decent UOP 1.8 ml/kg/hr. Net equal yesterday  Pulm: remains on 3L [place]  Cards: no hx - BP wnl, persistent tachycardia  Hepatobil: LFTs normalized, TG WNL  Neuro: hx anxiety - Dilaudid PCA  ID: [id number] of abx for possible necrotizing pancreatitis. Zosyn switched to Imipenem. Afebrile, WBC back up to 24.6  Best Practices: Treatment Lovenox for new thrombosis, Protonix IV  TPN Access: PICC placed 08/10/14  TPN start date: 4/15 >>  Plan: - Decrease Clinimix E 5/15  to 60 ml/min + IVFE at 10 ml/hr.  This will provide 1502 kCal per day and 72g of protein per day to meet 71% of her estimated kCal needs and 68% of her estimated protein needs. Hope is that oral intake will meet rest of nutritional goals. - Consider tube feeds if PO intake not improving - Daily multivitamin and trace elements - Continue moderate SSI q4h and 15 units of regular insulin in TPN - F/U TPN labs, PO intake, and necessity of starting tube feeding  Enzo Bi, PharmD Clinical Pharmacist Resident Pager [phone] 08/16/2014 6:58 AM   Pt is  tolerating clear liquids well today.  Plan: - Decrease Clinimix E 5/15 to 35ml/hr now and let run until 1800 today, then d/c TPN - Change SSI and CBGs to [facility] and bedtime - D/C TPN labs and orders

## 2014-08-17 DIAGNOSIS — K5900 Constipation, unspecified: Secondary | ICD-10-CM

## 2014-08-17 LAB — GLUCOSE, CAPILLARY
GLUCOSE-CAPILLARY: 113 mg/dL — AB (ref 70–99)
GLUCOSE-CAPILLARY: 89 mg/dL (ref 70–99)
Glucose-Capillary: 146 mg/dL — ABNORMAL HIGH (ref 70–99)
Glucose-Capillary: 95 mg/dL (ref 70–99)

## 2014-08-17 LAB — COMPREHENSIVE METABOLIC PANEL
ALBUMIN: 2.2 g/dL — AB (ref 3.5–5.2)
ALT: 19 U/L (ref 0–35)
AST: 23 U/L (ref 0–37)
Alkaline Phosphatase: 69 U/L (ref 39–117)
Anion gap: 11 (ref 5–15)
BUN: 7 mg/dL (ref 6–23)
CALCIUM: 8.2 mg/dL — AB (ref 8.4–10.5)
CO2: 26 mmol/L (ref 19–32)
CREATININE: 0.72 mg/dL (ref 0.50–1.10)
Chloride: 96 mmol/L (ref 96–112)
GFR calc Af Amer: >90 mL/min (ref >90)
GFR calc non Af Amer: >90 mL/min (ref >90)
Glucose, Bld: 131 mg/dL — ABNORMAL HIGH (ref 70–99)
Potassium: 3.8 mmol/L (ref 3.5–5.1)
Sodium: 133 mmol/L — ABNORMAL LOW (ref 135–145)
TOTAL PROTEIN: 6.3 g/dL (ref 6.0–8.3)
Total Bilirubin: 0.9 mg/dL (ref 0.3–1.2)

## 2014-08-17 LAB — CBC
HEMATOCRIT: 31.9 % — AB (ref 36.0–46.0)
HEMOGLOBIN: 10.7 g/dL — AB (ref 12.0–15.0)
MCH: 29.6 pg (ref 26.0–34.0)
MCHC: 33.5 g/dL (ref 30.0–36.0)
MCV: 88.1 fL (ref 78.0–100.0)
Platelets: 249 10*3/uL (ref 150–400)
RBC: 3.62 MIL/uL — ABNORMAL LOW (ref 3.87–5.11)
RDW: 13.3 % (ref 11.5–15.5)
WBC: 27.4 10*3/uL — ABNORMAL HIGH (ref 4.0–10.5)

## 2014-08-17 NOTE — Progress Notes (Signed)
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   Medical History: Past Medical History  Diagnosis Date  . Anxiety     diagnosed in 2012  . Contraceptive use     Had nexplanon - removed, now on OCP  . Dysmenorrhea     Medications:  Prescriptions prior to admission  Medication Sig Dispense Refill Last Dose  . citalopram (CELEXA) 20 MG tablet Take 20 mg by mouth daily.   08/06/2014 at Unknown time  . Cyanocobalamin (VITAMIN B-12 IJ) Inject 1 application into the skin every Monday, Wednesday, and Friday.   08/05/2014 at Unknown time  . ibuprofen (ADVIL,MOTRIN) 200 MG tablet Take 400 mg by mouth every 6 (six) hours as needed (pain).    few days ago  . phentermine (ADIPEX-P) 37.5 MG tablet Take 37.5 mg by mouth daily.   08/06/2014 at Unknown time  . PRESCRIPTION MEDICATION Inject 1 application into the skin every Monday, Wednesday, and Friday. HCG Injection   08/05/2014 at Unknown time  . PRESCRIPTION MEDICATION Inject 1 application into the skin every Monday, Wednesday, and Friday. Lipotropic injection   08/05/2014 at Unknown time  . rizatriptan (MAXALT) 10 MG tablet Take 10 mg by mouth daily as needed for migraine.    2 months ago    Assessment: 24 yo F with severe gallstone pancreatitis.  Pharmacy consulted to dose LMWH for superior mesenteric vein thrombosis.  Plan for 3 months of LMWH for small segment thrombus within the superior  mesenteric vein, assumed to be reactive in nature due to severe pancreatitis.  Wt 91.6 kg; CBC stable.     Goal of Therapy:  Heparin level 0.6 - 1 units/ml Monitor platelets by anticoagulation protocol: Yes   Plan:  -Lovenox 1 mg/kg = 90 mg sq q12h with plan to transition to DOAC when oral intake improves  -f/u renal function, CBC  Vinnie LevelBenjamin Maxamilian Amadon, PharmD., BCPS Clinical Pharmacist Pager [phone]

## 2014-08-17 NOTE — Evaluation (Signed)
Physical Therapy Evaluation Patient Details Name: [name] BrightMakayla Flannigan MRN: [id number] DOB: [birth year] Today's Date: 08/17/2014   History of Present Illness  24 y/o female admitted with necrotizing pancreatitis and gallstone.  Clinical Impression  Patient presents with limited mobility due to pain and deconditioning with HR up to 126 with ambulation.  She will benefit from skilled PT in the acute setting to maximize independence and safety with mobility. May benefit from outpatient PT at d/c for conditioning and endurance training.     Follow Up Recommendations Outpatient PT    Equipment Recommendations  Other (comment) (TBA)    Recommendations for Other Services       Precautions / Restrictions Precautions Precautions: None      Mobility  Bed Mobility   Bed Mobility: Supine to Sit;Sit to Supine     Supine to sit: Modified independent (Device/Increase time) Sit to supine: Modified independent (Device/Increase time)   General bed mobility comments: independent in positioning for comfort  Transfers Overall transfer level: Modified independent               General transfer comment: able to stand from bed with safe technique  Ambulation/Gait Ambulation/Gait assistance: Supervision;Min guard Ambulation Distance (Feet): 125 Feet Assistive device:  (pushing IV pole) Gait Pattern/deviations: Step-through pattern;Trunk flexed;Decreased stride length     General Gait Details: increased SOB with pain increased with ambulation  Stairs            Wheelchair Mobility    Modified Rankin (Stroke Patients Only)       Balance Overall balance assessment: Needs assistance           Standing balance-Leahy Scale: Fair Standing balance comment: limited with ambulation mainly due to pain                             Pertinent Vitals/Pain Pain Assessment: 0-10 Pain Score: 5  Pain Location: abdomen Pain Intervention(s): Monitored during session;Patient  requesting pain meds-RN notified;RN gave pain meds during session    Home Living Family/patient expects to be discharged to:: Private residence Living Arrangements: Parent Available Help at Discharge: Family Type of Home: House Home Access: Stairs to enter Entrance Stairs-Rails: None Secretary/administratorntrance Stairs-Number of Steps: 4 Home Layout: Two level;Bed/bath upstairs Home Equipment: None      Prior Function Level of Independence: Independent         Comments: works as Pharmacist, hospitalparamedic     Hand Dominance        Extremity/Trunk Assessment               Lower Extremity Assessment: Overall WFL for tasks assessed         Communication   Communication: No difficulties  Cognition Arousal/Alertness: Awake/alert Behavior During Therapy: WFL for tasks assessed/performed Overall Cognitive Status: Within Functional Limits for tasks assessed                      General Comments General comments (skin integrity, edema, etc.): patient reports difficulty with cleaning herself after bowel movements due to pain and short arms.  plan to get toilet aide to assist    Exercises        Assessment/Plan    PT Assessment Patient needs continued PT services  PT Diagnosis Difficulty walking;Acute pain   PT Problem List Decreased activity tolerance;Pain;Decreased knowledge of use of DME;Decreased mobility  PT Treatment Interventions DME instruction;Gait training;Therapeutic exercise;Balance training;Stair training;Functional mobility training;Therapeutic activities;Patient/family education  PT Goals (Current goals can be found in the Care Plan section) Acute Rehab PT Goals Patient Stated Goal: To be independent as possible PT Goal Formulation: With patient Time For Goal Achievement: 08/24/14 Potential to Achieve Goals: Good    Frequency Min 3X/week   Barriers to discharge        Co-evaluation               End of Session Equipment Utilized During Treatment: Gait  belt Activity Tolerance: Patient limited by pain Patient left: in bed;with call bell/phone within reach;with nursing/sitter in room           Time: 1455-1520 PT Time Calculation (min) (ACUTE ONLY): 25 min   Charges:   PT Evaluation $Initial PT Evaluation Tier I: 1 Procedure PT Treatments $Gait Training: 8-22 mins   PT G Codes:        Filmore Molyneux,CYNDI August 18, 2014, 3:27 PM  Sheran Lawless, PT [phone] 2014/08/18

## 2014-08-17 NOTE — Progress Notes (Signed)
[facility] PROGRESS NOTE  [name] Blanchard KXF:[id number] DOB: 06-Jan-1991 DOA: 08/06/2014 PCP: No PCP Per Patient   Admit HPI / Brief Narrative: 24 y/o female with with chronic intermittent abdominal pain for 6 months admitted with acute pancreatitis. Patient was transferred from [facility] ER where lipase was reportedly over 37,000, w/ mildly elevated lfts, nml alk phos and tbili; and a CT showing a stone in the gallbladder w/ GB wall thickening + edema around the pancreas w/ cbd dilation but with no stone.     Assessment/Plan: Acute severe gallstone pancreatitis Lipase has normalized - Gen Surg following - follow-up CT noted partial pancreatic necrosis and persistent/increased peripancreatic inflammatory change - additionally noted was a small segment thrombus within the superior mesenteric vein though the portal and splenic veins appeared patent - the pt states pain is slowly improving overall - she is currently tolerating a full liquid diet - TNA has been discontinued. Antibiotics have been discontinued. WBC elevated will follow for now. - will need f/u CT early next week - currently off PCA pump. On IV pain medication every 2 hours when necessary.  Small segment thrombus within the superior mesenteric vein  Assumed to be reactive in nature due to severe pancreatitis - discussed today w/ Hematology who agree that latest studies have concluded that short term treatment results in the best outcome for the pt by significantly lowering risk of progression of thrombus to complete occlusion as well as further propagation resulting in bowel ischemia or organ damage - will plan for a 3 month course as the cause is clearly identifiable/reversible (pancreatitis) - this tx could be interrupted for GB removal w/o need for lovenox overlap as a NOAC will be the tx of choice as oral intake becomes more reliable   Constipation Primarily driven by PCA - resolved at present  SIRS in the setting of acute  pancreatitis SIRS physiology persists with persistent sinus tachycardia and markedly elevated white blood cell count but pt is hemodynamically stable - fever curve is slowly trending up or - I suspect this is related to noninfectious inflammation but we will need to follow closely, especially with antibiotic has been discontinued.  Hypokalemia Corrected with TNA - follow as we transition to oral intake only  Hypophosphatemia Corrected with TNA - follow as we transition to oral intake only  Nutrition TNA has been discontinued. Patient currently tolerating full liquids.  Obesity - Body mass index is 31.66 kg/(m^2).   Code Status: Full Family Communication: Updated patient. No family present. Disposition Plan: Remain inpatient   Consultants:  General surgery  Procedures: HIDA - 4/13 - unremarkable   Antibiotics: Zosyn 4/10 > 4/18 Primaxin 4/19>>> 08/16/2014   HPI/Subjective: Patient denies any nausea or emesis. Patient stated tolerating current diet. Patient states improvement with abdominal pain and more generalized soreness. Patient states had a bowel movement today.  Objective: Filed Vitals:   08/17/14 0550  BP: 118/68  Pulse: 110  Temp: 98.5 F (36.9 C)  Resp: 17    Intake/Output Summary (Last 24 hours) at 08/17/14 1309 Last data filed at 08/17/14 0200  Gross per 24 hour  Intake    480 ml  Output    500 ml  Net    -20 ml   Filed Weights   08/15/14 0449 08/16/14 0600 08/17/14 0500  Weight: 87 kg (191 lb 12.8 oz) 86.3 kg (190 lb 4.1 oz) 87 kg (191 lb 12.8 oz)    Exam:   General:  NAD  Cardiovascular: Tachycardic,  Respiratory:  Clear to auscultation bilaterally. No wheezing, no crackles, no rhonchi.  Abdomen: Soft, mildly distended, mildly tender to palpation in the upper abdomen mostly, positive bowel sounds.  Musculoskeletal: No clubbing or cyanosis or edema.  Data Reviewed: Basic Metabolic Panel:  Recent Labs Lab 08/11/14 0500  08/11/14 1624 08/12/14 0420 08/13/14 0430 08/15/14 0350 08/16/14 0620 08/17/14 0455  NA 137 138 135 132* 135 134* 133*  K 3.2* 3.4* 3.7 4.2 3.2* 3.8 3.8  CL 100 101 97 94* 97 99 96  CO2 28 28 27 27 27 26 26   GLUCOSE 118* 128* 142* 163* 181* 148* 131*  BUN 8 7 7 6 9 8 7   CREATININE 0.68 0.59 0.66 0.62 0.79 0.65 0.72  CALCIUM 7.7* 7.8* 7.7* 7.6* 7.9* 8.1* 8.2*  MG 1.8 2.3 1.9 2.0 1.8  --   --   PHOS 1.3* 1.7* 2.1* 2.9 3.1  --   --    Liver Function Tests:  Recent Labs Lab 08/13/14 0430 08/15/14 0350 08/17/14 0455  AST 33 24 23  ALT 14 16 19   ALKPHOS 98 80 69  BILITOT 1.1 0.8 0.9  PROT 5.6* 5.7* 6.3  ALBUMIN 2.1* 2.1* 2.2*    Recent Labs Lab 08/13/14 0430  LIPASE 40   No results for input(s): AMMONIA in the last 168 hours. CBC:  Recent Labs Lab 08/14/14 0555 08/15/14 0350 08/17/14 0455  WBC 23.7* 24.6* 27.4*  NEUTROABS 20.4* 20.7*  --   HGB 11.7* 11.2* 10.7*  HCT 35.0* 33.7* 31.9*  MCV 89.5 88.9 88.1  PLT 185 166 249   Cardiac Enzymes: No results for input(s): CKTOTAL, CKMB, CKMBINDEX, TROPONINI in the last 168 hours. BNP (last 3 results) No results for input(s): BNP in the last 8760 hours.  ProBNP (last 3 results) No results for input(s): PROBNP in the last 8760 hours.  CBG:  Recent Labs Lab 08/16/14 0819 08/16/14 1226 08/16/14 1612 08/16/14 2207 08/17/14 0800  GLUCAP 156* 149* 157* 91 113*    Recent Results (from the past 240 hour(s))  MRSA PCR Screening     Status: None   Collection Time: 08/08/14  4:00 PM  Result Value Ref Range Status   MRSA by PCR NEGATIVE NEGATIVE Final    Comment:        The GeneXpert MRSA Assay (FDA approved for NASAL specimens only), is one component of a comprehensive MRSA colonization surveillance program. It is not intended to diagnose MRSA infection nor to guide or monitor treatment for MRSA infections.      Studies: No results found.  Scheduled Meds: . antiseptic oral rinse  7 mL Mouth Rinse  q12n4p  . chlorhexidine  15 mL Mouth Rinse BID  . docusate  100 mg Oral BID  . enoxaparin (LOVENOX) injection  1 mg/kg Subcutaneous Q12H  . feeding supplement (ENSURE ENLIVE)  237 mL Oral BID BM  . insulin aspart  0-15 Units Subcutaneous TID WC  . pantoprazole (PROTONIX) IV  40 mg Intravenous Q12H  . sodium chloride  10-40 mL Intracatheter Q12H   Continuous Infusions:   Principal Problem:   Pancreatitis, acute Active Problems:   Abdominal pain   Gallstones   Common bile duct dilatation   Leukocytosis   Acute pancreatitis   Acute gallstone pancreatitis   Thrombus   Other constipation   SIRS (systemic inflammatory response syndrome)   Hypokalemia   Hypophosphatemia    Time spent: 65 minutes    Gavino Fouch M.D. [facility] Pager [phone]. If 7PM-7AM, please contact night-coverage at  [email or web address], password [facility] 08/17/2014, 1:09 PM  LOS: 11 days

## 2014-08-17 NOTE — Progress Notes (Signed)
  Subjective: tol diet, had bm this am, feels better, less pain  Objective: Vital signs in last 24 hours: Temp:  [97.7 F (36.5 C)-101.1 F (38.4 C)] 98.5 F (36.9 C) (04/21 0550) Pulse Rate:  [110-121] 110 (04/21 0550) Resp:  [17-21] 17 (04/21 0550) BP: (118-136)/(68-89) 118/68 mmHg (04/21 0550) SpO2:  [97 %-98 %] 97 % (04/21 0550) Weight:  [87 kg (191 lb 12.8 oz)] 87 kg (191 lb 12.8 oz) (04/21 0500) Last BM Date: 08/15/14  Intake/Output from previous day: 04/20 0701 - 04/21 0700 In: 1278 [P.O.:720; TPN:558] Out: 1250 [Urine:1250] Intake/Output this shift:    General appearance: no distress Resp: diminished breath sounds bibasilar Cardio: tachycardic GI: mild tenderness upper abdomen, much better, softer, bs present  Lab Results:   Recent Labs  08/15/14 0350 08/17/14 0455  WBC 24.6* 27.4*  HGB 11.2* 10.7*  HCT 33.7* 31.9*  PLT 166 249   BMET  Recent Labs  08/16/14 0620 08/17/14 0455  NA 134* 133*  K 3.8 3.8  CL 99 96  CO2 26 26  GLUCOSE 148* 131*  BUN 8 7  CREATININE 0.65 0.72  CALCIUM 8.1* 8.2*   PT/INR No results for input(s): LABPROT, INR in the last 72 hours. ABG No results for input(s): PHART, HCO3 in the last 72 hours.  Invalid input(s): PCO2, PO2  Studies/Results: No results found.  Anti-infectives: Anti-infectives    Start     Dose/Rate Route Frequency Ordered Stop   08/15/14 0900  imipenem-cilastatin (PRIMAXIN) 500 mg in sodium chloride 0.9 % 100 mL IVPB  Status:  Discontinued     500 mg 200 mL/hr over 30 Minutes Intravenous 4 times per day 08/15/14 0821 08/16/14 1158   08/07/14 0400  piperacillin-tazobactam (ZOSYN) IVPB 3.375 g  Status:  Discontinued     3.375 g 12.5 mL/hr over 240 Minutes Intravenous Every 8 hours 08/06/14 1910 08/15/14 0816   08/06/14 1930  piperacillin-tazobactam (ZOSYN) IVPB 3.375 g     3.375 g 12.5 mL/hr over 240 Minutes Intravenous  Once 08/06/14 1909 08/06/14 2340      Assessment/Plan: HD 11  necrotizing pancreatitis  1. Neuro- continue narcotics as she needs some but I think attempting to minimize them would be better for her, would consider toradol as option also, once she tolerates po can transition to po regimen 2. CV- tachycardia stable 3. Pulm- cont o2, monitor effusions needs pulm toilet and up oob, will write for pt/ot today 4. GI- can advance to low fat diet as tolerated, would repeat ct scan next week to follow this, lap chole will be delayed due to severe pancreatitis 5. Renal- cr normal 6. Heme- I would not anticoagulate her for smv thrombus seen. would recommend f/u on scan next week. This is reactive secondary to pancreatitis. Review of literature suggests this is unnecessary in the setting of no complications from it.  Would assess next week for extension or anticoagulate if symptoms.  There appears to be no difference in recanalization of splanchnic veins with or without anticoagulation.  7. ID- still with fever but clinically she looks much better today, will follow, if continues may need repeat ct scan   [facility] Surgical CenterWAKEFIELD,[name] Beckles 08/17/2014

## 2014-08-18 LAB — CBC WITH DIFFERENTIAL/PLATELET
Basophils Absolute: 0.1 10*3/uL (ref 0.0–0.1)
Basophils Relative: 0 % (ref 0–1)
EOS PCT: 1 % (ref 0–5)
Eosinophils Absolute: 0.1 10*3/uL (ref 0.0–0.7)
HCT: 30.8 % — ABNORMAL LOW (ref 36.0–46.0)
HEMOGLOBIN: 10.2 g/dL — AB (ref 12.0–15.0)
LYMPHS ABS: 1.6 10*3/uL (ref 0.7–4.0)
Lymphocytes Relative: 7 % — ABNORMAL LOW (ref 12–46)
MCH: 29.3 pg (ref 26.0–34.0)
MCHC: 33.1 g/dL (ref 30.0–36.0)
MCV: 88.5 fL (ref 78.0–100.0)
MONO ABS: 2.3 10*3/uL — AB (ref 0.1–1.0)
MONOS PCT: 10 % (ref 3–12)
Neutro Abs: 18.1 10*3/uL — ABNORMAL HIGH (ref 1.7–7.7)
Neutrophils Relative %: 82 % — ABNORMAL HIGH (ref 43–77)
Platelets: 341 10*3/uL (ref 150–400)
RBC: 3.48 MIL/uL — AB (ref 3.87–5.11)
RDW: 13.4 % (ref 11.5–15.5)
WBC: 22.1 10*3/uL — ABNORMAL HIGH (ref 4.0–10.5)

## 2014-08-18 LAB — COMPREHENSIVE METABOLIC PANEL
ALT: 21 U/L (ref 0–35)
ANION GAP: 11 (ref 5–15)
AST: 26 U/L (ref 0–37)
Albumin: 2.1 g/dL — ABNORMAL LOW (ref 3.5–5.2)
Alkaline Phosphatase: 61 U/L (ref 39–117)
BUN: 7 mg/dL (ref 6–23)
CO2: 26 mmol/L (ref 19–32)
CREATININE: 0.74 mg/dL (ref 0.50–1.10)
Calcium: 8.3 mg/dL — ABNORMAL LOW (ref 8.4–10.5)
Chloride: 95 mmol/L — ABNORMAL LOW (ref 96–112)
GFR calc Af Amer: >90 mL/min (ref >90)
Glucose, Bld: 133 mg/dL — ABNORMAL HIGH (ref 70–99)
POTASSIUM: 3.7 mmol/L (ref 3.5–5.1)
SODIUM: 132 mmol/L — AB (ref 135–145)
Total Bilirubin: 0.8 mg/dL (ref 0.3–1.2)
Total Protein: 6.3 g/dL (ref 6.0–8.3)

## 2014-08-18 LAB — GLUCOSE, CAPILLARY
GLUCOSE-CAPILLARY: 103 mg/dL — AB (ref 70–99)
Glucose-Capillary: 126 mg/dL — ABNORMAL HIGH (ref 70–99)
Glucose-Capillary: 128 mg/dL — ABNORMAL HIGH (ref 70–99)
Glucose-Capillary: 107 mg/dL — ABNORMAL HIGH (ref 70–99)

## 2014-08-18 LAB — MAGNESIUM: Magnesium: 1.9 mg/dL (ref 1.5–2.5)

## 2014-08-18 MED ORDER — DOCUSATE SODIUM 100 MG PO CAPS
100.0000 mg | ORAL_CAPSULE | Freq: Two times a day (BID) | ORAL | Status: DC
  Administered 2014-08-18 – 2014-08-21 (×6): 100 mg via ORAL
  Filled 2014-08-18 (×6): qty 1

## 2014-08-18 MED ORDER — HYDROCODONE-ACETAMINOPHEN 5-325 MG PO TABS
1.0000 | ORAL_TABLET | ORAL | Status: DC | PRN
  Administered 2014-08-18 – 2014-08-19 (×4): 2 via ORAL
  Filled 2014-08-18 (×4): qty 2

## 2014-08-18 MED ORDER — CITALOPRAM HYDROBROMIDE 20 MG PO TABS
20.0000 mg | ORAL_TABLET | Freq: Every day | ORAL | Status: DC
  Administered 2014-08-18 – 2014-08-24 (×7): 20 mg via ORAL
  Filled 2014-08-18 (×7): qty 1

## 2014-08-18 MED ORDER — SUMATRIPTAN SUCCINATE 100 MG PO TABS
100.0000 mg | ORAL_TABLET | ORAL | Status: DC | PRN
  Filled 2014-08-18: qty 1

## 2014-08-18 MED ORDER — SUMATRIPTAN SUCCINATE 100 MG PO TABS: 100.0000 mg | ORAL_TABLET | Freq: Four times a day (QID) | ORAL | Status: DC | PRN

## 2014-08-18 NOTE — Progress Notes (Addendum)
[facility] PROGRESS NOTE  [name] Blanchard IBB:[id number] DOB: [birth year] DOA: 08/06/2014 PCP: No PCP Per Patient   Admit HPI / Brief Narrative: 24 y/o female with with chronic intermittent abdominal pain for 6 months admitted with acute pancreatitis. Patient was transferred from [facility] ER where lipase was reportedly over 37,000, w/ mildly elevated lfts, nml alk phos and tbili; and a CT showing a stone in the gallbladder w/ GB wall thickening + edema around the pancreas w/ cbd dilation but with no stone.     Assessment/Plan: Acute severe gallstone pancreatitis Lipase has normalized - Gen Surg following - follow-up CT noted partial pancreatic necrosis and persistent/increased peripancreatic inflammatory change - additionally noted was a small segment thrombus within the superior mesenteric vein though the portal and splenic veins appeared patent - the pt states pain is slowly improving overall - she is currently tolerating a solid diet. - TNA has been discontinued. Antibiotics have been discontinued. WBC trending back down. - will need f/u CT early next week - currently off PCA pump. On IV pain medication every 2 hours when necessary. Will add oral pain medications as needed. General surgery following.  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 Thereasa Solo discussed w/ Hematology who agree that latest studies have concluded that short term treatment results in the best outcome for the pt by significantly lowering risk of progression of thrombus to complete occlusion as well as further propagation resulting in bowel ischemia or organ damage - will plan for a 3 month course as the cause is clearly identifiable/reversible (pancreatitis) - this tx could be interrupted for GB removal w/o need for lovenox overlap as a NOAC will be the tx of choice as oral intake becomes more reliable   Constipation Primarily driven by  PCA - resolved at present  SIRS in the setting of acute pancreatitis SIRS physiology persists with persistent sinus tachycardia and markedly elevated white blood cell count but pt is hemodynamically stable - fever curve is slowly trending up or - I suspect this is related to noninfectious inflammation but we will need to follow closely, especially with antibiotic has been discontinued. WBC trending back down. Patient with clinical improvement. Follow.  Hypokalemia Corrected with TNA - follow as we transition to oral intake only  Hypophosphatemia Corrected with TNA - follow as we transition to oral intake only  Nutrition TNA has been discontinued. Patient currently tolerating solid diet.   Obesity - Body mass index is 31.66 kg/(m^2).   Code Status: Full Family Communication: Updated patient. No family present. Disposition Plan: Remain inpatient   Consultants:  General surgery  Procedures: HIDA - 4/13 - unremarkable   Antibiotics: Zosyn 4/10 > 4/18 Primaxin 4/19>>> 08/16/2014   HPI/Subjective: Patient denies any nausea or emesis. Patient states she is tolerating current full liquid diet. Patient states abdominal pain has improved and just a soreness now.   Objective: Filed Vitals:   08/18/14 1419  BP: 134/76  Pulse: 106  Temp: 97.9 F (36.6 C)  Resp: 18    Intake/Output Summary (Last 24 hours) at 08/18/14 1719 Last data filed at 08/18/14 1421  Gross per 24 hour  Intake    838 ml  Output    600 ml  Net    238 ml   Filed Weights   08/16/14 0600 08/17/14 0500 08/18/14 0435  Weight: 86.3 kg (190 lb 4.1 oz) 87 kg (191 lb 12.8 oz) 86.2 kg (190  lb 0.6 oz)    Exam:   General:  NAD  Cardiovascular: Tachycardic,  Respiratory: Clear to auscultation bilaterally. No wheezing, no crackles, no rhonchi.  Abdomen: Soft, mildly distended, less tender to palpation in the upper abdomen mostly, positive bowel sounds.  Musculoskeletal: No clubbing or cyanosis or  edema.  Data Reviewed: Basic Metabolic Panel:  Recent Labs Lab 08/12/14 0420 08/13/14 0430 08/15/14 0350 08/16/14 0620 08/17/14 0455 08/18/14 0530  NA 135 132* 135 134* 133* 132*  K 3.7 4.2 3.2* 3.8 3.8 3.7  CL 97 94* 97 99 96 95*  CO2 _0 GLUCOSE 142* 163* 181* 148* 131* 133*  BUN _1 CREATININE 0.66 0.62 0.79 0.65 0.72 0.74  CALCIUM 7.7* 7.6* 7.9* 8.1* 8.2* 8.3*  MG 1.9 2.0 1.8  --   --  1.9  PHOS 2.1* 2.9 3.1  --   --   --    Liver Function Tests:  Recent Labs Lab 08/13/14 0430 08/15/14 0350 08/17/14 0455 08/18/14 0530  AST 33 _2 ALT _3 ALKPHOS 98 80 69 61  BILITOT 1.1 0.8 0.9 0.8  PROT 5.6* 5.7* 6.3 6.3  ALBUMIN 2.1* 2.1* 2.2* 2.1*    Recent Labs Lab 08/13/14 0430  LIPASE 40   No results for input(s): AMMONIA in the last 168 hours. CBC:  Recent Labs Lab 08/14/14 0555 08/15/14 0350 08/17/14 0455 08/18/14 0530  WBC 23.7* 24.6* 27.4* 22.1*  NEUTROABS 20.4* 20.7*  --  18.1*  HGB 11.7* 11.2* 10.7* 10.2*  HCT 35.0* 33.7* 31.9* 30.8*  MCV 89.5 88.9 88.1 88.5  PLT 185 166 249 341   Cardiac Enzymes: No results for input(s): CKTOTAL, CKMB, CKMBINDEX, TROPONINI in the last 168 hours. BNP (last 3 results) No results for input(s): BNP in the last 8760 hours.  ProBNP (last 3 results) No results for input(s): PROBNP in the last 8760 hours.  CBG:  Recent Labs Lab 08/17/14 1713 08/17/14 2223 08/18/14 0752 08/18/14 1159 08/18/14 1648  GLUCAP 89 95 103* 126* 128*    No results found for this or any previous visit (from the past 240 hour(s)).   Studies: No results found.  Scheduled Meds: . antiseptic oral rinse  7 mL Mouth Rinse q12n4p  . chlorhexidine  15 mL Mouth Rinse BID  . citalopram  20 mg Oral Daily  . docusate sodium  100 mg Oral BID  . enoxaparin (LOVENOX) injection  1 mg/kg Subcutaneous Q12H  . feeding supplement (ENSURE ENLIVE)  237 mL Oral BID BM  . insulin aspart  0-15 Units Subcutaneous  TID WC  . pantoprazole (PROTONIX) IV  40 mg Intravenous Q12H  . sodium chloride  10-40 mL Intracatheter Q12H   Continuous Infusions:   Principal Problem:   Pancreatitis, acute Active Problems:   Abdominal pain   Gallstones   Common bile duct dilatation   Leukocytosis   Acute pancreatitis   Acute gallstone pancreatitis   Thrombus   Other constipation   SIRS (systemic inflammatory response syndrome)   Hypokalemia   Hypophosphatemia   Constipated    Time spent: 87 minutes    [name] Blanchard M.D. [facility] Pager [phone]. If 7PM-7AM, please contact night-coverage at [email or web address], password [facility] 08/18/2014, 5:19 PM  LOS: 12 days

## 2014-08-18 NOTE — Evaluation (Signed)
  Occupational Therapy Evaluation Patient Details Name: [name] BrightMakayla Alcocer MRN: [id number] DOB: [birth year] Today's Date: 08/18/2014    History of Present Illness 24 y/o female admitted with necrotizing pancreatitis and gallstone.   Clinical Impression   Patient admitted with above. Patient independent PTA. Patient currently functioning at an overall mod I level, needed extra time to complete tasks. Educated patient on techniques to decrease anxiety. D/C from acute OT services and no additional follow-up OT needs at this time. All appropriate education provided to patient. Please re-order OT if needed.      Follow Up Recommendations  No OT follow up;Supervision - Intermittent    Equipment Recommendations  None recommended by OT    Recommendations for Other Services  None at this time   Precautions / Restrictions Precautions Precautions: None Restrictions Weight Bearing Restrictions: No      Mobility Bed Mobility Overal bed mobility: Modified Independent General bed mobility comments: extra time  Transfers Overall transfer level: Modified independent General transfer comment: extra time needed, no device needed    Balance Overall balance assessment: No apparent balance deficits (not formally assessed)    ADL Overall ADL's : Modified independent General ADL Comments: Patient overall mod I with ADLs and functional mobility    Pertinent Vitals/Pain Pain Assessment: 0-10 Pain Score: 4  Pain Location: abdomen Pain Descriptors / Indicators: Aching Pain Intervention(s): Monitored during session;Premedicated before session     Hand Dominance Right   Extremity/Trunk Assessment Upper Extremity Assessment Upper Extremity Assessment: Overall WFL for tasks assessed   Lower Extremity Assessment Lower Extremity Assessment: Overall WFL for tasks assessed   Cervical / Trunk Assessment Cervical / Trunk Assessment: Normal   Communication Communication Communication: No  difficulties   Cognition Arousal/Alertness: Awake/alert Behavior During Therapy: WFL for tasks assessed/performed Overall Cognitive Status: Within Functional Limits for tasks assessed             Home Living Family/patient expects to be discharged to:: Private residence Living Arrangements: Parent (granddad and his wife) Available Help at Discharge: Family;Available 24 hours/day Type of Home: House Home Access: Stairs to enter Entergy CorporationEntrance Stairs-Number of Steps: 4 Entrance Stairs-Rails: None Home Layout: Two level;Bed/bath upstairs Alternate Level Stairs-Number of Steps: flight Alternate Level Stairs-Rails: Right Bathroom Shower/Tub: Tub/shower unit;Door   Foot LockerBathroom Toilet: Standard     Home Equipment: None          Prior Functioning/Environment Level of Independence: Independent        Comments: works as Corporate investment bankerparamedic    OT Diagnosis:  n/a, no further acute OT needs identified    OT Problem List:   n/a, no further acute OT needs identified    OT Treatment/Interventions:   n/a, no further acute OT needs identified    OT Goals(Current goals can be found in the care plan section) Acute Rehab OT Goals Patient Stated Goal: To be independent as possible  OT Frequency:   n/a, no further acute OT needs identified    Barriers to D/C:  None known at this time   End of Session Activity Tolerance: Patient tolerated treatment well Patient left: in bed;with call bell/phone within reach   Time: 1354-1415 OT Time Calculation (min): 21 min Charges:  OT General Charges $OT Visit: 1 Procedure OT Evaluation $Initial OT Evaluation Tier I: 1 Procedure  Edgar Reisz , MS, OTR/L, CLT Pager: [phone]  08/18/2014, 2:21 PM

## 2014-08-18 NOTE — Progress Notes (Signed)
[facility] Progress Note    Subjective: Sweet girl.  Trying to manage her pain.  No acute distress.  Seems very frightened.  Objective: Vital signs in last 24 hours: Temp:  [98 F (36.7 C)-98.3 F (36.8 C)] 98.1 F (36.7 C) (04/22 0435) Pulse Rate:  [117-127] 127 (04/22 0435) Resp:  [16-18] 18 (04/22 0435) BP: (125-128)/(72-74) 128/72 mmHg (04/22 0435) SpO2:  [95 %-99 %] 98 % (04/22 0435) Weight:  [86.2 kg (190 lb 0.6 oz)] 86.2 kg (190 lb 0.6 oz) (04/22 0435) Last BM Date: 08/17/14  Intake/Output from previous day: 04/21 0701 - 04/22 0700 In: 518 [P.O.:118] Out: 600 [Urine:600] Intake/Output this shift:    General: No acute distress  Lungs: Clear to auscultation.  Abd: Mildly distended, good bowel sounds.  Having bowel movements.  On full liquid diet.  Extremities: No clinical signs or symptoms of DVT.  Neuro: Intact  Lab Results:   BMET  Recent Labs  08/17/14 0455 08/18/14 0530  NA 133* 132*  K 3.8 3.7  CL 96 95*  CO2 26 26  GLUCOSE 131* 133*  BUN 7 7  CREATININE 0.72 0.74  CALCIUM 8.2* 8.3*   PT/INR No results for input(s): LABPROT, INR in the last 72 hours. ABG No results for input(s): PHART, HCO3 in the last 72 hours.  Invalid input(s): PCO2, PO2  Studies/Results: No results found.  Anti-infectives: Anti-infectives    Start     Dose/Rate Route Frequency Ordered Stop   08/15/14 0900  imipenem-cilastatin (PRIMAXIN) 500 mg in sodium chloride 0.9 % 100 mL IVPB  Status:  Discontinued     500 mg 200 mL/hr over 30 Minutes Intravenous 4 times per day 08/15/14 0821 08/16/14 1158   08/07/14 0400  piperacillin-tazobactam (ZOSYN) IVPB 3.375 g  Status:  Discontinued     3.375 g 12.5 mL/hr over 240 Minutes Intravenous Every 8 hours 08/06/14 1910 08/15/14 0816   08/06/14 1930  piperacillin-tazobactam (ZOSYN) IVPB 3.375 g     3.375 g 12.5 mL/hr over 240 Minutes Intravenous  Once 08/06/14 1909 08/06/14 2340      Assessment/Plan: s/p  Advance diet Can  probably advance to a low fat, soft diet after today.    We will probably not see the patient over the weekend if she continues to improve.  If she happens to get discharged, she will need to followup with [facility] for future cholecystectomy   LOS: 12 days   Marta LamasJames O. Gae BonWyatt, III, MD, FACS [phone]--pager [phone]--[facility] 08/18/2014

## 2014-08-19 LAB — BASIC METABOLIC PANEL
ANION GAP: 12 (ref 5–15)
BUN: 6 mg/dL (ref 6–23)
CHLORIDE: 95 mmol/L — AB (ref 96–112)
CO2: 27 mmol/L (ref 19–32)
Calcium: 8.7 mg/dL (ref 8.4–10.5)
Creatinine, Ser: 0.71 mg/dL (ref 0.50–1.10)
GFR calc Af Amer: >90 mL/min (ref >90)
GFR calc non Af Amer: >90 mL/min (ref >90)
Glucose, Bld: 124 mg/dL — ABNORMAL HIGH (ref 70–99)
POTASSIUM: 3.7 mmol/L (ref 3.5–5.1)
Sodium: 134 mmol/L — ABNORMAL LOW (ref 135–145)

## 2014-08-19 LAB — CBC
HCT: 30.4 % — ABNORMAL LOW (ref 36.0–46.0)
Hemoglobin: 10 g/dL — ABNORMAL LOW (ref 12.0–15.0)
MCH: 29.5 pg (ref 26.0–34.0)
MCHC: 32.9 g/dL (ref 30.0–36.0)
MCV: 89.7 fL (ref 78.0–100.0)
PLATELETS: 403 10*3/uL — AB (ref 150–400)
RBC: 3.39 MIL/uL — AB (ref 3.87–5.11)
RDW: 13.3 % (ref 11.5–15.5)
WBC: 19.5 10*3/uL — AB (ref 4.0–10.5)

## 2014-08-19 LAB — GLUCOSE, CAPILLARY
GLUCOSE-CAPILLARY: 130 mg/dL — AB (ref 70–99)
GLUCOSE-CAPILLARY: 99 mg/dL (ref 70–99)
GLUCOSE-CAPILLARY: 88 mg/dL (ref 70–99)
Glucose-Capillary: 96 mg/dL (ref 70–99)

## 2014-08-19 MED ORDER — SODIUM CHLORIDE 0.9 % IV SOLN
INTRAVENOUS | Status: DC
  Administered 2014-08-19 – 2014-08-23 (×10): via INTRAVENOUS
  Filled 2014-08-19 (×15): qty 1000

## 2014-08-19 MED ORDER — PANTOPRAZOLE SODIUM 40 MG PO TBEC
40.0000 mg | DELAYED_RELEASE_TABLET | Freq: Two times a day (BID) | ORAL | Status: DC
  Administered 2014-08-19 – 2014-08-24 (×10): 40 mg via ORAL
  Filled 2014-08-19 (×10): qty 1

## 2014-08-19 NOTE — Progress Notes (Signed)
[facility] PROGRESS NOTE  Shantia Sanford TYO:[id number] DOB: [birth year] DOA: 08/06/2014 PCP: No PCP Per Patient   Admit HPI / Brief Narrative: 24 y/o female with with chronic intermittent abdominal pain for 6 months admitted with acute pancreatitis. Patient was transferred from [facility] ER where lipase was reportedly over 37,000, w/ mildly elevated lfts, nml alk phos and tbili; and a CT showing a stone in the gallbladder w/ GB wall thickening + edema around the pancreas w/ cbd dilation but with no stone.     Assessment/Plan: Acute severe gallstone pancreatitis/nectrozing pancreatitis. 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 - TNA has been discontinued. Antibiotics have been discontinued. WBC trending back down. - will need f/u CT early next week - currently off PCA pump. On IV pain medication every 2 hours when necessary. Continue oral medication prn. General surgery following.  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 Thereasa Solo discussed w/ Hematology who agree that latest studies have concluded that short term treatment results in the best outcome for the pt by significantly lowering risk of progression of thrombus to complete occlusion as well as further propagation resulting in bowel ischemia or organ damage - will plan for a 3 month course as the cause is clearly identifiable/reversible (pancreatitis) - this tx could be interrupted for GB removal w/o need for lovenox overlap as a NOAC will be the tx of choice as oral intake becomes more reliable   Constipation Primarily  driven by PCA - resolved at present  SIRS in the setting of acute pancreatitis SIRS physiology persists with persistent sinus tachycardia and markedly elevated white blood cell count but pt is hemodynamically stable - fever curve is slowly trending back down. - Fever Likely related to noninfectious inflammation but we will need to follow closely. Antibiotics have been discontinued.  Follow.  Hypokalemia Corrected with TNA - follow as we transition to oral intake only  Hypophosphatemia Corrected with TNA - follow as we transition to oral intake only  Nutrition TNA has been discontinued. Patient currently tolerating solid diet.   Obesity - Body mass index is 31.66 kg/(m^2).   Code Status: Full Family Communication: Updated patient. No family present. Disposition Plan: Remain inpatient   Consultants:  General surgery  Procedures: HIDA - 4/13 - unremarkable   Antibiotics: Zosyn 4/10 > 4/18 Primaxin 4/19>>> 08/16/2014   HPI/Subjective: Patient states some abdominal discomfort. Passing flatus.  Objective: Filed Vitals:   08/19/14 0439  BP: 125/68  Pulse: 105  Temp: 98.2 F (36.8 C)  Resp: 18    Intake/Output Summary (Last 24 hours) at 08/19/14 1149 Last data filed at 08/19/14 0930  Gross per 24 hour  Intake   1312 ml  Output      0 ml  Net   1312 ml   Filed Weights   08/17/14 0500 08/18/14 0435 08/19/14 0439  Weight: 87 kg (191 lb 12.8 oz) 86.2 kg (190 lb 0.6 oz) 82.3 kg (181 lb 7 oz)    Exam:   General:  NAD  Cardiovascular: Tachycardic,  Respiratory: Clear to auscultation bilaterally. No wheezing, no crackles, no rhonchi.  Abdomen: Soft, mildly distended, less tender  to palpation in the upper abdomen mostly, positive bowel sounds.  Musculoskeletal: No clubbing or cyanosis or edema.  Data Reviewed: Basic Metabolic Panel:  Recent Labs Lab 08/13/14 0430 08/15/14 0350 08/16/14 0620 08/17/14 0455 08/18/14 0530 08/19/14 0530  NA 132* 135 134*  133* 132* 134*  K 4.2 3.2* 3.8 3.8 3.7 3.7  CL 94* 97 99 96 95* 95*  CO2 _0 GLUCOSE 163* 181* 148* 131* 133* 124*  BUN _1 CREATININE 0.62 0.79 0.65 0.72 0.74 0.71  CALCIUM 7.6* 7.9* 8.1* 8.2* 8.3* 8.7  MG 2.0 1.8  --   --  1.9  --   PHOS 2.9 3.1  --   --   --   --    Liver Function Tests:  Recent Labs Lab 08/13/14 0430 08/15/14 0350 08/17/14 0455 08/18/14 0530  AST 33 _2 ALT _3 ALKPHOS 98 80 69 61  BILITOT 1.1 0.8 0.9 0.8  PROT 5.6* 5.7* 6.3 6.3  ALBUMIN 2.1* 2.1* 2.2* 2.1*    Recent Labs Lab 08/13/14 0430  LIPASE 40   No results for input(s): AMMONIA in the last 168 hours. CBC:  Recent Labs Lab 08/14/14 0555 08/15/14 0350 08/17/14 0455 08/18/14 0530 08/19/14 0530  WBC 23.7* 24.6* 27.4* 22.1* 19.5*  NEUTROABS 20.4* 20.7*  --  18.1*  --   HGB 11.7* 11.2* 10.7* 10.2* 10.0*  HCT 35.0* 33.7* 31.9* 30.8* 30.4*  MCV 89.5 88.9 88.1 88.5 89.7  PLT 185 166 249 341 403*   Cardiac Enzymes: No results for input(s): CKTOTAL, CKMB, CKMBINDEX, TROPONINI in the last 168 hours. BNP (last 3 results) No results for input(s): BNP in the last 8760 hours.  ProBNP (last 3 results) No results for input(s): PROBNP in the last 8760 hours.  CBG:  Recent Labs Lab 08/18/14 0752 08/18/14 1159 08/18/14 1648 08/18/14 2145 08/19/14 0742  GLUCAP 103* 126* 128* 107* 130*    No results found for this or any previous visit (from the past 240 hour(s)).   Studies: No results found.  Scheduled Meds: . antiseptic oral rinse  7 mL Mouth Rinse q12n4p  . chlorhexidine  15 mL Mouth Rinse BID  . citalopram  20 mg Oral Daily  . docusate sodium  100 mg Oral BID  . enoxaparin (LOVENOX) injection  1 mg/kg Subcutaneous Q12H  . feeding supplement (ENSURE ENLIVE)  237 mL Oral BID BM  . insulin aspart  0-15 Units Subcutaneous TID WC  . pantoprazole  40 mg Oral BID  . sodium chloride  10-40 mL Intracatheter Q12H   Continuous Infusions: .  sodium chloride 0.9 % 1,000 mL with potassium chloride 20 mEq infusion 100 mL/hr at 08/19/14 1023    Principal Problem:   Pancreatitis, acute Active Problems:   Abdominal pain   Gallstones   Common bile duct dilatation   Leukocytosis   Acute pancreatitis   Acute gallstone pancreatitis   Thrombus   Other constipation   SIRS (systemic inflammatory response syndrome)   Hypokalemia   Hypophosphatemia   Constipated    Time spent: 21 minutes    Emilly Lavey M.D. [facility] Pager [phone]. If 7PM-7AM, please contact night-coverage at [email or web address], password [facility] 08/19/2014, 11:49 AM  LOS: 13 days

## 2014-08-19 NOTE — Progress Notes (Signed)
  Subjective: Still has pain. Sometimes exacerbated by drinking liquid diet. Passing flatus. Afebrile. Alert. WBC still 19,500. Hemoglobin 10.0. Potassium 3.7. Glucose 124. Calcium 8.7.  Objective: Vital signs in last 24 hours: Temp:  [97.9 F (36.6 C)-98.2 F (36.8 C)] 98.2 F (36.8 C) (04/23 0439) Pulse Rate:  [105-106] 105 (04/23 0439) Resp:  [16-18] 18 (04/23 0439) BP: (125-134)/(68-76) 125/68 mmHg (04/23 0439) SpO2:  [95 %-98 %] 95 % (04/23 0439) Weight:  [82.3 kg (181 lb 7 oz)] 82.3 kg (181 lb 7 oz) (04/23 0439) Last BM Date: 08/17/14  Intake/Output from previous day: 04/22 0701 - 04/23 0700 In: 1312 [P.O.:1302; I.V.:10] Out: -  Intake/Output this shift:    General appearance: Alert. Pleasant and cooperative. Fearful. Mild distress. GI: Tender in upper abdomen. Doesn't seem distended. Obese. No mass.  Lab Results:   Recent Labs  08/18/14 0530 08/19/14 0530  WBC 22.1* 19.5*  HGB 10.2* 10.0*  HCT 30.8* 30.4*  PLT 341 403*   BMET  Recent Labs  08/18/14 0530 08/19/14 0530  NA 132* 134*  K 3.7 3.7  CL 95* 95*  CO2 26 27  GLUCOSE 133* 124*  BUN 7 6  CREATININE 0.74 0.71  CALCIUM 8.3* 8.7   PT/INR No results for input(s): LABPROT, INR in the last 72 hours. ABG No results for input(s): PHART, HCO3 in the last 72 hours.  Invalid input(s): PCO2, PO2  Studies/Results: No results found.  Anti-infectives: Anti-infectives    Start     Dose/Rate Route Frequency Ordered Stop   08/15/14 0900  imipenem-cilastatin (PRIMAXIN) 500 mg in sodium chloride 0.9 % 100 mL IVPB  Status:  Discontinued     500 mg 200 mL/hr over 30 Minutes Intravenous 4 times per day 08/15/14 0821 08/16/14 1158   08/07/14 0400  piperacillin-tazobactam (ZOSYN) IVPB 3.375 g  Status:  Discontinued     3.375 g 12.5 mL/hr over 240 Minutes Intravenous Every 8 hours 08/06/14 1910 08/15/14 0816   08/06/14 1930  piperacillin-tazobactam (ZOSYN) IVPB 3.375 g     3.375 g 12.5 mL/hr over 240  Minutes Intravenous  Once 08/06/14 1909 08/06/14 2340      Assessment/Plan:  Acute necrotizing pancreatitis. Still symptomatic. Expect resolution will be slow. Backoff on diet Increase IV fluids. On IV Primaxin and Zosyn. This is reasonable, but  efficacy unclear in absence of culture data. Repeat CT scan Monday or Tuesday.  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 Continue full dose Lovenox subcutaneous twice a day, being managed by pharmacy.  Gallstones. Likely etiology of pancreatitis No evidence of biliary tract obstruction at this time. She would benefit from elective cholecystectomy several weeks from now which pancreatitis has completely resolved.   LOS: 13 days    Rilen Shukla M 08/19/2014

## 2014-08-20 ENCOUNTER — Inpatient Hospital Stay (HOSPITAL_COMMUNITY): Payer: Managed Care, Other (non HMO)

## 2014-08-20 ENCOUNTER — Encounter (HOSPITAL_COMMUNITY): Payer: Self-pay | Admitting: Radiology

## 2014-08-20 DIAGNOSIS — K8591 Acute pancreatitis with uninfected necrosis, unspecified: Secondary | ICD-10-CM | POA: Insufficient documentation

## 2014-08-20 DIAGNOSIS — K859 Acute pancreatitis, unspecified: Secondary | ICD-10-CM

## 2014-08-20 LAB — BASIC METABOLIC PANEL
ANION GAP: 12 (ref 5–15)
CO2: 25 mmol/L (ref 19–32)
Calcium: 8.8 mg/dL (ref 8.4–10.5)
Chloride: 97 mmol/L (ref 96–112)
Creatinine, Ser: 0.75 mg/dL (ref 0.50–1.10)
GFR calc Af Amer: >90 mL/min (ref >90)
GFR calc non Af Amer: >90 mL/min (ref >90)
Glucose, Bld: 110 mg/dL — ABNORMAL HIGH (ref 70–99)
POTASSIUM: 4.3 mmol/L (ref 3.5–5.1)
SODIUM: 134 mmol/L — AB (ref 135–145)

## 2014-08-20 LAB — CBC
HCT: 32.3 % — ABNORMAL LOW (ref 36.0–46.0)
HEMOGLOBIN: 10.6 g/dL — AB (ref 12.0–15.0)
MCH: 29.6 pg (ref 26.0–34.0)
MCHC: 32.8 g/dL (ref 30.0–36.0)
MCV: 90.2 fL (ref 78.0–100.0)
Platelets: 498 10*3/uL — ABNORMAL HIGH (ref 150–400)
RBC: 3.58 MIL/uL — ABNORMAL LOW (ref 3.87–5.11)
RDW: 13.3 % (ref 11.5–15.5)
WBC: 22.1 10*3/uL — AB (ref 4.0–10.5)

## 2014-08-20 LAB — GLUCOSE, CAPILLARY
GLUCOSE-CAPILLARY: 81 mg/dL (ref 70–99)
Glucose-Capillary: 95 mg/dL (ref 70–99)
Glucose-Capillary: 113 mg/dL — ABNORMAL HIGH (ref 70–99)
Glucose-Capillary: 106 mg/dL — ABNORMAL HIGH (ref 70–99)

## 2014-08-20 MED ORDER — IOHEXOL 300 MG/ML  SOLN
100.0000 mL | Freq: Once | INTRAMUSCULAR | Status: AC | PRN
  Administered 2014-08-20: 100 mL via INTRAVENOUS

## 2014-08-20 MED ORDER — IOHEXOL 300 MG/ML  SOLN: 25.0000 mL | INTRAMUSCULAR | Status: AC

## 2014-08-20 NOTE — Progress Notes (Signed)
ANTICOAGULATION CONSULT NOTE - F/u Consult  Pharmacy Consult for LMWH Indication: superior mesenteric vein thrombosis  No Known Allergies  Patient Measurements: Height: 5\' 5"  (165.1 cm) Weight: 188 lb 7.9 oz (85.5 kg) IBW/kg (Calculated) : 57   Vital Signs: Temp: 98 F (36.7 C) (04/24 0504) Temp Source: Oral (04/24 0504) BP: 120/73 mmHg (04/24 0504) Pulse Rate: 100 (04/24 0504)  Labs:  Recent Labs  08/18/14 0530 08/19/14 0530 08/20/14 0450  HGB 10.2* 10.0* 10.6*  HCT 30.8* 30.4* 32.3*  PLT 341 403* 498*  CREATININE 0.74 0.71 0.75    Estimated Creatinine Clearance: 119.1 mL/min (by C-G formula based on Cr of 0.75).   Medical History: Past Medical History  Diagnosis Date  . Anxiety     diagnosed in 2012  . Contraceptive use     Had nexplanon - removed, now on OCP  . Dysmenorrhea     Medications:  Prescriptions prior to admission  Medication Sig Dispense Refill Last Dose  . citalopram (CELEXA) 20 MG tablet Take 20 mg by mouth daily.   08/06/2014 at Unknown time  . Cyanocobalamin (VITAMIN B-12 IJ) Inject 1 application into the skin every Monday, Wednesday, and Friday.   08/05/2014 at Unknown time  . ibuprofen (ADVIL,MOTRIN) 200 MG tablet Take 400 mg by mouth every 6 (six) hours as needed (pain).    few days ago  . phentermine (ADIPEX-P) 37.5 MG tablet Take 37.5 mg by mouth daily.   08/06/2014 at Unknown time  . PRESCRIPTION MEDICATION Inject 1 application into the skin every Monday, Wednesday, and Friday. HCG Injection   08/05/2014 at Unknown time  . PRESCRIPTION MEDICATION Inject 1 application into the skin every Monday, Wednesday, and Friday. Lipotropic injection   08/05/2014 at Unknown time  . rizatriptan (MAXALT) 10 MG tablet Take 10 mg by mouth daily as needed for migraine.    2 months ago    Assessment: 24 yo F with severe gallstone pancreatitis.  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  Plan for 3 months of LMWH for small segment  thrombus within the superior mesenteric vein, assumed to be reactive in nature due to severe pancreatitis. H/H remain low but stable, Plt 498.    Wt 85.5 kg; CBC stable.     Goal of Therapy:  DVT treatment  Monitor platelets by anticoagulation protocol: Yes   Plan:  -Lovenox 1 mg/kg = 90 mg sq q12h with plan to transition to DOAC when oral intake improves  -f/u renal function, CBC -Planning repeat CT   Vinnie LevelBenjamin Analise Glotfelty, PharmD., BCPS Clinical Pharmacist Pager [phone]

## 2014-08-20 NOTE — Progress Notes (Signed)
  Subjective: Feels okay. Still having upper abdominal pain. Passing flatus and semi-formed stools. Denies nausea or vomiting. WBC 22,000. Hemoglobin 10.6, stable. Potassium 4.3. Creatinine 0.75. Calcium 8.8. Glucose 110. Antibiotics discontinued yesterday On Lovenox twice a day because of small segment thrombus within superior mesenteric vein.  Objective: Vital signs in last 24 hours: Temp:  [97.6 F (36.4 C)-98 F (36.7 C)] 98 F (36.7 C) (04/24 0504) Pulse Rate:  [99-100] 100 (04/24 0504) Resp:  [17-18] 17 (04/24 0504) BP: (112-128)/(69-73) 120/73 mmHg (04/24 0504) SpO2:  [96 %-100 %] 98 % (04/24 0504) Weight:  [85.5 kg (188 lb 7.9 oz)] 85.5 kg (188 lb 7.9 oz) (04/24 0504) Last BM Date: 08/19/14  Intake/Output from previous day: 04/23 0701 - 04/24 0700 In: 3060 [P.O.:1060; I.V.:2000] Out: 600 [Urine:600] Intake/Output this shift:    General appearance: Alert. No acute distress. Ambulating independently. Some anxiety. Resp: clear to auscultation bilaterally GI: Tender with guarding across upper abdomen. Obese but doesn't seem distended. No mass. Bowel sounds present.  Lab Results:   Recent Labs  08/19/14 0530 08/20/14 0450  WBC 19.5* 22.1*  HGB 10.0* 10.6*  HCT 30.4* 32.3*  PLT 403* 498*   BMET  Recent Labs  08/19/14 0530 08/20/14 0450  NA 134* 134*  K 3.7 4.3  CL 95* 97  CO2 27 25  GLUCOSE 124* 110*  BUN 6 <5*  CREATININE 0.71 0.75  CALCIUM 8.7 8.8   PT/INR No results for input(s): LABPROT, INR in the last 72 hours. ABG No results for input(s): PHART, HCO3 in the last 72 hours.  Invalid input(s): PCO2, PO2  Studies/Results: No results found.  Anti-infectives: Anti-infectives    Start     Dose/Rate Route Frequency Ordered Stop   08/15/14 0900  imipenem-cilastatin (PRIMAXIN) 500 mg in sodium chloride 0.9 % 100 mL IVPB  Status:  Discontinued     500 mg 200 mL/hr over 30 Minutes Intravenous 4 times per day 08/15/14 0821 08/16/14 1158   08/07/14  0400  piperacillin-tazobactam (ZOSYN) IVPB 3.375 g  Status:  Discontinued     3.375 g 12.5 mL/hr over 240 Minutes Intravenous Every 8 hours 08/06/14 1910 08/15/14 0816   08/06/14 1930  piperacillin-tazobactam (ZOSYN) IVPB 3.375 g     3.375 g 12.5 mL/hr over 240 Minutes Intravenous  Once 08/06/14 1909 08/06/14 2340      Assessment/Plan:  Acute necrotizing pancreatitis. Still symptomatic. Expect resolution will be slow. Backoff on diet Increase IV fluids. Agree with discontinuation of antibiotics. No clinical indication at this point. Concerned about her [place] ptosis and continued tenderness. We will repeat CT abdomen and pelvis with IV and oral contrast today to assess for abscess or developing pseudocyst. Hopefully will get a good look at the mesenteric venous thrombus again.  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 Continue full dose Lovenox subcutaneous twice a day, being managed by pharmacy.  Gallstones. Likely etiology of pancreatitis No evidence of biliary tract obstruction at this time. She would benefit from elective cholecystectomy several weeks from now which pancreatitis has completely resolved.  Obesity. BMI 31.66.  Anxiety. On Celexa and when necessary Ativan.   LOS: 14 days    Bradi Arbuthnot M 08/20/2014

## 2014-08-20 NOTE — Progress Notes (Signed)
[facility] PROGRESS NOTE  [name] Blanchard IWP:[id number] DOB: [birth year] DOA: 08/06/2014 PCP: No PCP Per Patient   Admit HPI / Brief Narrative: 24 y/o female with with chronic intermittent abdominal pain for 6 months admitted with acute pancreatitis. Patient was transferred from [facility] ER where lipase was reportedly over 37,000, w/ mildly elevated lfts, nml alk phos and tbili; and a CT showing a stone in the gallbladder w/ GB wall thickening + edema around the pancreas w/ cbd dilation but with no stone.     Assessment/Plan: Acute severe gallstone pancreatitis/nectrozing pancreatitis. 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 - TNA has been discontinued. Antibiotics have been discontinued. WBC is fluctuating. Repeat CT of the abdomen and pelvis has been ordered by general surgery. - currently off PCA pump. On IV pain medication every 2 hours when necessary. Continue oral medication prn. General surgery following.  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 Thereasa Solo discussed w/ Hematology who agree that latest studies have concluded that short term treatment results in the best outcome for the pt by significantly lowering risk of progression of thrombus to complete occlusion as well as further propagation resulting in bowel ischemia or organ damage - will plan for a 3 month course as the cause is clearly identifiable/reversible (pancreatitis) - this tx could be interrupted for GB removal w/o need for lovenox overlap as a NOAC will be the tx of choice as oral intake becomes more  reliable   Constipation Primarily driven by PCA - resolved at present  SIRS in the setting of acute pancreatitis SIRS physiology persists with persistent sinus tachycardia and markedly elevated white blood cell count but pt is hemodynamically stable - fever curve is slowly trending back down. - Fever Likely related to noninfectious inflammation, which has trended down. Antibiotics have been discontinued. CT abdomen and pelvis has been ordered per general surgery and is pending.  Follow.  Hypokalemia Corrected with TNA - follow.  Hypophosphatemia Corrected with TNA - follow.  Nutrition TNA has been discontinued. Patient currently tolerating solid diet.   Obesity - Body mass index is 31.66 kg/(m^2).   Code Status: Full Family Communication: Updated patient. No family present. Disposition Plan: Remain inpatient   Consultants:  General surgery  Procedures: HIDA - 4/13 - unremarkable   Antibiotics: Zosyn 4/10 > 4/18 Primaxin 4/19>>> 08/16/2014   HPI/Subjective: Patient states abdominal discomfort improving with passing of flatus. No emesis. Episode of nausea with drinking contrast. No SOB.   Objective: Filed Vitals:   08/20/14 0504  BP: 120/73  Pulse: 100  Temp: 98 F (36.7 C)  Resp: 17    Intake/Output Summary (Last 24 hours) at 08/20/14 1250 Last data filed at 08/20/14 1100  Gross per 24 hour  Intake   3775 ml  Output    600 ml  Net   3175 ml   Filed Weights   08/18/14 0435 08/19/14 0439 08/20/14 0504  Weight: 86.2 kg (190 lb 0.6 oz) 82.3 kg (181 lb 7 oz) 85.5 kg (188 lb 7.9 oz)    Exam:   General:  NAD  Cardiovascular: Tachycardic,  Respiratory: Clear to auscultation bilaterally. No wheezing,  no crackles, no rhonchi.  Abdomen: Soft, mildly distended, less tender to palpation in the upper abdomen, positive bowel sounds.  Musculoskeletal: No clubbing or cyanosis or edema.  Data Reviewed: Basic Metabolic Panel:  Recent Labs Lab  08/15/14 0350 08/16/14 0620 08/17/14 0455 08/18/14 0530 08/19/14 0530 08/20/14 0450  NA 135 134* 133* 132* 134* 134*  K 3.2* 3.8 3.8 3.7 3.7 4.3  CL 97 99 96 95* 95* 97  CO2 27 26 26 26 27 25   GLUCOSE 181* 148* 131* 133* 124* 110*  BUN 9 8 7 7 6  <5*  CREATININE 0.79 0.65 0.72 0.74 0.71 0.75  CALCIUM 7.9* 8.1* 8.2* 8.3* 8.7 8.8  MG 1.8  --   --  1.9  --   --   PHOS 3.1  --   --   --   --   --    Liver Function Tests:  Recent Labs Lab 08/15/14 0350 08/17/14 0455 08/18/14 0530  AST 24 23 26   ALT 16 19 21   ALKPHOS 80 69 61  BILITOT 0.8 0.9 0.8  PROT 5.7* 6.3 6.3  ALBUMIN 2.1* 2.2* 2.1*   No results for input(s): LIPASE, AMYLASE in the last 168 hours. No results for input(s): AMMONIA in the last 168 hours. CBC:  Recent Labs Lab 08/14/14 0555 08/15/14 0350 08/17/14 0455 08/18/14 0530 08/19/14 0530 08/20/14 0450  WBC 23.7* 24.6* 27.4* 22.1* 19.5* 22.1*  NEUTROABS 20.4* 20.7*  --  18.1*  --   --   HGB 11.7* 11.2* 10.7* 10.2* 10.0* 10.6*  HCT 35.0* 33.7* 31.9* 30.8* 30.4* 32.3*  MCV 89.5 88.9 88.1 88.5 89.7 90.2  PLT 185 166 249 341 403* 498*   Cardiac Enzymes: No results for input(s): CKTOTAL, CKMB, CKMBINDEX, TROPONINI in the last 168 hours. BNP (last 3 results) No results for input(s): BNP in the last 8760 hours.  ProBNP (last 3 results) No results for input(s): PROBNP in the last 8760 hours.  CBG:  Recent Labs Lab 08/19/14 1159 08/19/14 1719 08/19/14 2157 08/20/14 0744 08/20/14 1206  GLUCAP 99 96 88 81 95    No results found for this or any previous visit (from the past 240 hour(s)).   Studies: Ct Abdomen Pelvis W Contrast  08/20/2014   CLINICAL DATA:  Necrotizing pancreatitis. Superior mesenteric vein clot. No anti coagulation. Reassess clot and pseudocyst. Evolving pancreatitis. Persistent leukocytosis.  EXAM: CT ABDOMEN AND PELVIS WITH CONTRAST  TECHNIQUE: Multidetector CT imaging of the abdomen and pelvis was performed using the standard  protocol following bolus administration of intravenous contrast.  CONTRAST:  191m OMNIPAQUE IOHEXOL 300 MG/ML  SOLN  COMPARISON:  None.  FINDINGS: Evolving pancreatitis is present. Compared to the prior exam 08/14/2014, there is better definition of the pseudocyst along the anterior and inferior aspect of the pancreas. There is almost no enhancing pancreatic neck tissue which could produce bridging pancreatitis and attention on follow-up is recommended. Fluid in the pericolic gutters is become more nodular, probably representing Organization.  There is no convincing evidence of abscess or superinfection of pancreatic pseudocysts. The largest developing pseudocyst is now developing a wall and resides in the lesser sac, measuring 10.6 cm transverse by 4.5 cm AP. Smaller components of this are present inferiorly. In aggregate, the craniocaudal dimension of the peripancreatic pseudocysts is 10.6 cm.  The lung bases show atelectasis. Fatty infiltration at the falciform ligament of the liver. Punctate high-density areas present in the gallbladder compatible with a small gallstone. The gallbladder is partially contracted.  The splenic  vein remains patent. There is mass effect on the portal system at the porta splenic confluence. There is no longer are any mesenteric thrombus identified.  No secondary findings of venous ischemia or infarct in the bowel.  Urinary bladder is distended. Small amount of free fluid in the anatomic pelvis. Atrophic appearance of the uterus.  IMPRESSION: 1. Evolving pancreatitis with better definition of pseudocysts with the largest in the lesser sac. 2. Resolution of superior mesenteric vein thrombosis. Portal venous system is patent on today's exam. No vascular complications of bowel. 3. No imaging features to suggest superinfection of pancreatic pseudocyst or phlegmon.   Electronically Signed   By: Dereck Ligas M.D.   On: 08/20/2014 12:22    Scheduled Meds: . citalopram  20 mg Oral  Daily  . docusate sodium  100 mg Oral BID  . enoxaparin (LOVENOX) injection  1 mg/kg Subcutaneous Q12H  . feeding supplement (ENSURE ENLIVE)  237 mL Oral BID BM  . insulin aspart  0-15 Units Subcutaneous TID WC  . pantoprazole  40 mg Oral BID  . sodium chloride  10-40 mL Intracatheter Q12H   Continuous Infusions: . sodium chloride 0.9 % 1,000 mL with potassium chloride 20 mEq infusion 100 mL/hr at 08/20/14 0500    Principal Problem:   Pancreatitis, acute Active Problems:   Abdominal pain   Gallstones   Common bile duct dilatation   Leukocytosis   Acute pancreatitis   Acute gallstone pancreatitis   Thrombus   Other constipation   SIRS (systemic inflammatory response syndrome)   Hypokalemia   Hypophosphatemia   Constipated    Time spent: 73 minutes    Jacky Dross M.D. [facility] Pager [phone]. If 7PM-7AM, please contact night-coverage at [email or web address], password [facility] 08/20/2014, 12:50 PM  LOS: 14 days

## 2014-08-21 LAB — COMPREHENSIVE METABOLIC PANEL
ALK PHOS: 49 U/L (ref 39–117)
ALT: 18 U/L (ref 0–35)
AST: 21 U/L (ref 0–37)
Albumin: 2.4 g/dL — ABNORMAL LOW (ref 3.5–5.2)
Anion gap: 9 (ref 5–15)
CALCIUM: 8.8 mg/dL (ref 8.4–10.5)
CO2: 26 mmol/L (ref 19–32)
Chloride: 98 mmol/L (ref 96–112)
Creatinine, Ser: 0.69 mg/dL (ref 0.50–1.10)
GFR calc non Af Amer: >90 mL/min (ref >90)
GLUCOSE: 137 mg/dL — AB (ref 70–99)
Potassium: 4.2 mmol/L (ref 3.5–5.1)
Sodium: 133 mmol/L — ABNORMAL LOW (ref 135–145)
Total Bilirubin: 0.6 mg/dL (ref 0.3–1.2)
Total Protein: 6.7 g/dL (ref 6.0–8.3)

## 2014-08-21 LAB — CBC WITH DIFFERENTIAL/PLATELET
Basophils Absolute: 0.1 10*3/uL (ref 0.0–0.1)
Basophils Relative: 0 % (ref 0–1)
Eosinophils Absolute: 0.2 10*3/uL (ref 0.0–0.7)
Eosinophils Relative: 1 % (ref 0–5)
HEMATOCRIT: 30.2 % — AB (ref 36.0–46.0)
HEMOGLOBIN: 9.8 g/dL — AB (ref 12.0–15.0)
LYMPHS PCT: 12 % (ref 12–46)
Lymphs Abs: 1.9 10*3/uL (ref 0.7–4.0)
MCH: 29.2 pg (ref 26.0–34.0)
MCHC: 32.5 g/dL (ref 30.0–36.0)
MCV: 89.9 fL (ref 78.0–100.0)
MONOS PCT: 8 % (ref 3–12)
Monocytes Absolute: 1.4 10*3/uL — ABNORMAL HIGH (ref 0.1–1.0)
NEUTROS PCT: 79 % — AB (ref 43–77)
Neutro Abs: 12.7 10*3/uL — ABNORMAL HIGH (ref 1.7–7.7)
Platelets: 493 10*3/uL — ABNORMAL HIGH (ref 150–400)
RBC: 3.36 MIL/uL — ABNORMAL LOW (ref 3.87–5.11)
RDW: 13.3 % (ref 11.5–15.5)
WBC: 16.1 10*3/uL — ABNORMAL HIGH (ref 4.0–10.5)

## 2014-08-21 LAB — GLUCOSE, CAPILLARY
GLUCOSE-CAPILLARY: 98 mg/dL (ref 70–99)
Glucose-Capillary: 112 mg/dL — ABNORMAL HIGH (ref 70–99)
Glucose-Capillary: 96 mg/dL (ref 70–99)

## 2014-08-21 MED ORDER — ENOXAPARIN SODIUM 80 MG/0.8ML ~~LOC~~ SOLN
1.0000 mg/kg | Freq: Two times a day (BID) | SUBCUTANEOUS | Status: DC
  Administered 2014-08-21 – 2014-08-22 (×2): 80 mg via SUBCUTANEOUS
  Filled 2014-08-21 (×2): qty 0.8

## 2014-08-21 MED ORDER — POLYETHYLENE GLYCOL 3350 17 G PO PACK
17.0000 g | PACK | Freq: Every day | ORAL | Status: DC
  Administered 2014-08-21 – 2014-08-23 (×3): 17 g via ORAL
  Filled 2014-08-21 (×3): qty 1

## 2014-08-21 MED ORDER — OXYCODONE-ACETAMINOPHEN 5-325 MG PO TABS
1.0000 | ORAL_TABLET | ORAL | Status: DC | PRN
  Administered 2014-08-21 – 2014-08-23 (×6): 2 via ORAL
  Filled 2014-08-21 (×6): qty 2

## 2014-08-21 NOTE — Progress Notes (Signed)
Patient ID: [name] Blanchard, female   DOB: 1990-09-17, 24 y.o.   MRN: [id number]    Subjective: Pt feels better today.  Pain has lessened.  She is tolerating her full liquids.  Several small bites at a time.  I explained to her her CT scan results.  She is having some constipation.  Objective: Vital signs in last 24 hours: Temp:  [98.2 F (36.8 C)-98.3 F (36.8 C)] 98.2 F (36.8 C) (04/25 0558) Pulse Rate:  [97-114] 97 (04/25 0558) Resp:  [18-19] 18 (04/25 0558) BP: (103-130)/(58-82) 121/64 mmHg (04/25 0558) SpO2:  [99 %] 99 % (04/25 0558) Weight:  [80.967 kg (178 lb 8 oz)] 80.967 kg (178 lb 8 oz) (04/25 0500) Last BM Date: 08/20/14  Intake/Output from previous day: 04/24 0701 - 04/25 0700 In: 3582 [P.O.:1080; I.V.:2502] Out: -  Intake/Output this shift:    PE: Abd: soft, still full in the upper abdomen. Mildly tender in LUQ, but improved since the last time I saw her.  +BS Heart: regular Lungs: CTAB  Lab Results:   Recent Labs  08/19/14 0530 08/20/14 0450  WBC 19.5* 22.1*  HGB 10.0* 10.6*  HCT 30.4* 32.3*  PLT 403* 498*   BMET  Recent Labs  08/19/14 0530 08/20/14 0450  NA 134* 134*  K 3.7 4.3  CL 95* 97  CO2 27 25  GLUCOSE 124* 110*  BUN 6 <5*  CREATININE 0.71 0.75  CALCIUM 8.7 8.8   PT/INR No results for input(s): LABPROT, INR in the last 72 hours. CMP     Component Value Date/Time   NA 134* 08/20/2014 0450   K 4.3 08/20/2014 0450   CL 97 08/20/2014 0450   CO2 25 08/20/2014 0450   GLUCOSE 110* 08/20/2014 0450   BUN <5* 08/20/2014 0450   CREATININE 0.75 08/20/2014 0450   CALCIUM 8.8 08/20/2014 0450   PROT 6.3 08/18/2014 0530   ALBUMIN 2.1* 08/18/2014 0530   AST 26 08/18/2014 0530   ALT 21 08/18/2014 0530   ALKPHOS 61 08/18/2014 0530   BILITOT 0.8 08/18/2014 0530   GFRNONAA >90 08/20/2014 0450   GFRAA >90 08/20/2014 0450   Lipase     Component Value Date/Time   LIPASE 40 08/13/2014 0430       Studies/Results: Ct Abdomen Pelvis W  Contrast  08/20/2014   CLINICAL DATA:  Necrotizing pancreatitis. Superior mesenteric vein clot. No anti coagulation. Reassess clot and pseudocyst. Evolving pancreatitis. Persistent leukocytosis.  EXAM: CT ABDOMEN AND PELVIS WITH CONTRAST  TECHNIQUE: Multidetector CT imaging of the abdomen and pelvis was performed using the standard protocol following bolus administration of intravenous contrast.  CONTRAST:  OMNIPAQUE IOHEXOL 300 MG/ML  SOLN  COMPARISON:  None.  FINDINGS: Evolving pancreatitis is present. Compared to the prior exam 08/14/2014, there is better definition of the pseudocyst along the anterior and inferior aspect of the pancreas. There is almost no enhancing pancreatic neck tissue which could produce bridging pancreatitis and attention on follow-up is recommended. Fluid in the pericolic gutters is become more nodular, probably representing Organization.  There is no convincing evidence of abscess or superinfection of pancreatic pseudocysts. The largest developing pseudocyst is now developing a wall and resides in the lesser sac, measuring 10.6 cm transverse by 4.5 cm AP. Smaller components of this are present inferiorly. In aggregate, the craniocaudal dimension of the peripancreatic pseudocysts is 10.6 cm.  The lung bases show atelectasis. Fatty infiltration at the falciform ligament of the liver. Punctate high-density areas present in the gallbladder compatible  with a small gallstone. The gallbladder is partially contracted.  The splenic vein remains patent. There is mass effect on the portal system at the porta splenic confluence. There is no longer are any mesenteric thrombus identified.  No secondary findings of venous ischemia or infarct in the bowel.  Urinary bladder is distended. Small amount of free fluid in the anatomic pelvis. Atrophic appearance of the uterus.  IMPRESSION: 1. Evolving pancreatitis with better definition of pseudocysts with the largest in the lesser sac. 2. Resolution of  superior mesenteric vein thrombosis. Portal venous system is patent on today's exam. No vascular complications of bowel. 3. No imaging features to suggest superinfection of pancreatic pseudocyst or phlegmon.   Electronically Signed   By: Andreas NewportGeoffrey  Lamke M.D.   On: 08/20/2014 12:22    Anti-infectives: Anti-infectives    Start     Dose/Rate Route Frequency Ordered Stop   08/15/14 0900  imipenem-cilastatin (PRIMAXIN) 500 mg in sodium chloride 0.9 % 100 mL IVPB  Status:  Discontinued     500 mg 200 mL/hr over 30 Minutes Intravenous 4 times per day 08/15/14 0821 08/16/14 1158   08/07/14 0400  piperacillin-tazobactam (ZOSYN) IVPB 3.375 g  Status:  Discontinued     3.375 g 12.5 mL/hr over 240 Minutes Intravenous Every 8 hours 08/06/14 1910 08/15/14 0816   08/06/14 1930  piperacillin-tazobactam (ZOSYN) IVPB 3.375 g     3.375 g 12.5 mL/hr over 240 Minutes Intravenous  Once 08/06/14 1909 08/06/14 2340       Assessment/Plan  HD #15 Severe necrotizing gallstone pancreatitis with pseudocyst -CT shows new pseudocyst of 10.6cm.  There is no evidence of infection or abscess.  This is felt to likely be simple fluid.  No further course of action needs to be taken for this right now as this will likely resolve on its own.  If it develops into an abscess or infected, then she will likely need it drained -Tolerating full liquids,  Will leave on this today and see if we can advance her again tomorrow as her pain is better.  Labs are pending -IVF, pain control, antiemetics Leukocytosis -labs pending today, no abx right now Small segment thrombus within the superior mesenteric vein  -this has resolved on this CT scan.  Obesity - BMI 35 PCM -cont to try and advance diet.  If we keep advancing and having to decrease, she may need a PANDA tube for more consistent nutrition.  Will follow and see how she does. -cont Ensure  LOS: 15 days    Adreana Coull E 08/21/2014, 10:00 AM Pager: [phone]

## 2014-08-21 NOTE — Progress Notes (Signed)
[facility] PROGRESS NOTE  Aliegha Paullin GTX:[id number] DOB: 05-Sep-1990 DOA: 08/06/2014 PCP: No PCP Per Patient   Admit HPI / Brief Narrative: 24 y/o female with with chronic intermittent abdominal pain for 6 months admitted with acute pancreatitis. Patient was transferred from [facility] ER where lipase was reportedly over 37,000, w/ mildly elevated lfts, nml alk phos and tbili; and a CT showing a stone in the gallbladder w/ GB wall thickening + edema around the pancreas w/ cbd dilation but with no stone.     Assessment/Plan: Acute severe gallstone pancreatitis/nectrozing pancreatitis. 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 - TNA has been discontinued. Antibiotics have been discontinued. WBC is fluctuating. Repeat CT of the abdomen and pelvis with pseudocyst formation, resolution of superior mesenteric vein thrombosis, no imaging features to suggest superinfection of pancreatic pseudocyst or phlegmon. - currently off PCA pump. On IV pain medication every 2 hours when necessary. Patient with nausea when taking Vicodin. Discontinue Vicodin. Placed on Percocet as needed. General surgery following.  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 Thereasa Solo discussed w/ Hematology who agree that latest studies have concluded that short term treatment results in the best outcome for the pt by significantly lowering risk of progression of thrombus to complete occlusion as well as further propagation resulting in bowel ischemia or organ damage - will plan for a 3 month course as the cause is  clearly identifiable/reversible (pancreatitis) - this tx could be interrupted for GB removal w/o need for lovenox overlap as a NOAC will be the tx of choice as oral intake becomes more reliable. Repeat CT of the abdomen and pelvis shows resolution of thrombus.  Constipation Secondary to PCA - resolved at present. Continue bowel regimen of MiraLAX.  SIRS in the setting of acute pancreatitis SIRS physiology persisted early on in the hospitalization with with persistent sinus tachycardia and markedly elevated white blood cell count but pt is hemodynamically stable - fever curve is slowly trending back down. - Fever Likely related to noninfectious inflammation, which has trended down. Heart rate has improved. Antibiotics have been discontinued. Repeat CT abdomen and pelvis with evolving pancreatitis with some pseudocyst formation. Resolution of superior mesenteric vein thrombosis. No imaging features to suggest superinfection of pancreatic pseudocyst or phlegmon. Follow.   Hypokalemia Corrected with TNA - follow.  Hypophosphatemia Corrected with TNA - follow.  Nutrition TNA has been discontinued. Patient currently tolerating full liquid diet.   Obesity - Body mass index is 31.66 kg/(m^2).   Code Status: Full Family Communication: Updated patient. No family present. Disposition Plan: Remain inpatient   Consultants:  General surgery  Procedures: HIDA - 4/13 - unremarkable   Antibiotics: Zosyn 4/10 > 4/18 Primaxin 4/19>>> 08/16/2014   HPI/Subjective: Patient states abdominal discomfort improved. No emesis. No SOB. Patient states oral pain medication making her nauseous and would like it changed. Patient tolerating current diet. Patient wondering when she could be advanced to a solid diet.  Objective: Filed Vitals:   08/21/14 0558  BP: 121/64  Pulse: 97  Temp: 98.2 F (36.8 C)  Resp: 18    Intake/Output Summary (Last 24 hours) at 08/21/14 1424 Last  data filed at 08/21/14  1200  Gross per 24 hour  Intake   2537 ml  Output      0 ml  Net   2537 ml   Filed Weights   08/19/14 0439 08/20/14 0504 08/21/14 0500  Weight: 82.3 kg (181 lb 7 oz) 85.5 kg (188 lb 7.9 oz) 80.967 kg (178 lb 8 oz)    Exam:   General:  NAD  Cardiovascular: RRR  Respiratory: Clear to auscultation bilaterally. No wheezing, no crackles, no rhonchi.  Abdomen: Soft, mildly distended, less tender to palpation in the upper abdomen, positive bowel sounds.  Musculoskeletal: No clubbing or cyanosis or edema.  Data Reviewed: Basic Metabolic Panel:  Recent Labs Lab 08/15/14 0350  08/17/14 0455 08/18/14 0530 08/19/14 0530 08/20/14 0450 08/21/14 1030  NA 135  < > 133* 132* 134* 134* 133*  K 3.2*  < > 3.8 3.7 3.7 4.3 4.2  CL 97  < > 96 95* 95* 97 98  CO2 27  < > 26 26 27 25 26   GLUCOSE 181*  < > 131* 133* 124* 110* 137*  BUN 9  < > 7 7 6  <5* <5*  CREATININE 0.79  < > 0.72 0.74 0.71 0.75 0.69  CALCIUM 7.9*  < > 8.2* 8.3* 8.7 8.8 8.8  MG 1.8  --   --  1.9  --   --   --   PHOS 3.1  --   --   --   --   --   --   < > = values in this interval not displayed. Liver Function Tests:  Recent Labs Lab 08/15/14 0350 08/17/14 0455 08/18/14 0530 08/21/14 1030  AST 24 23 26 21   ALT 16 19 21 18   ALKPHOS 80 69 61 49  BILITOT 0.8 0.9 0.8 0.6  PROT 5.7* 6.3 6.3 6.7  ALBUMIN 2.1* 2.2* 2.1* 2.4*   No results for input(s): LIPASE, AMYLASE in the last 168 hours. No results for input(s): AMMONIA in the last 168 hours. CBC:  Recent Labs Lab 08/15/14 0350 08/17/14 0455 08/18/14 0530 08/19/14 0530 08/20/14 0450 08/21/14 1030  WBC 24.6* 27.4* 22.1* 19.5* 22.1* 16.1*  NEUTROABS 20.7*  --  18.1*  --   --  12.7*  HGB 11.2* 10.7* 10.2* 10.0* 10.6* 9.8*  HCT 33.7* 31.9* 30.8* 30.4* 32.3* 30.2*  MCV 88.9 88.1 88.5 89.7 90.2 89.9  PLT 166 249 341 403* 498* 493*   Cardiac Enzymes: No results for input(s): CKTOTAL, CKMB, CKMBINDEX, TROPONINI in the last 168 hours. BNP (last 3  results) No results for input(s): BNP in the last 8760 hours.  ProBNP (last 3 results) No results for input(s): PROBNP in the last 8760 hours.  CBG:  Recent Labs Lab 08/20/14 1206 08/20/14 1721 08/20/14 2148 08/21/14 0748 08/21/14 1302  GLUCAP 95 113* 106* 98 112*    No results found for this or any previous visit (from the past 240 hour(s)).   Studies: Ct Abdomen Pelvis W Contrast  08/20/2014   CLINICAL DATA:  Necrotizing pancreatitis. Superior mesenteric vein clot. No anti coagulation. Reassess clot and pseudocyst. Evolving pancreatitis. Persistent leukocytosis.  EXAM: CT ABDOMEN AND PELVIS WITH CONTRAST  TECHNIQUE: Multidetector CT imaging of the abdomen and pelvis was performed using the standard protocol following bolus administration of intravenous contrast.  CONTRAST:  166m OMNIPAQUE IOHEXOL 300 MG/ML  SOLN  COMPARISON:  None.  FINDINGS: Evolving pancreatitis is present. Compared to the prior exam 08/14/2014, there is better definition of the pseudocyst  along the anterior and inferior aspect of the pancreas. There is almost no enhancing pancreatic neck tissue which could produce bridging pancreatitis and attention on follow-up is recommended. Fluid in the pericolic gutters is become more nodular, probably representing Organization.  There is no convincing evidence of abscess or superinfection of pancreatic pseudocysts. The largest developing pseudocyst is now developing a wall and resides in the lesser sac, measuring 10.6 cm transverse by 4.5 cm AP. Smaller components of this are present inferiorly. In aggregate, the craniocaudal dimension of the peripancreatic pseudocysts is 10.6 cm.  The lung bases show atelectasis. Fatty infiltration at the falciform ligament of the liver. Punctate high-density areas present in the gallbladder compatible with a small gallstone. The gallbladder is partially contracted.  The splenic vein remains patent. There is mass effect on the portal system at the  porta splenic confluence. There is no longer are any mesenteric thrombus identified.  No secondary findings of venous ischemia or infarct in the bowel.  Urinary bladder is distended. Small amount of free fluid in the anatomic pelvis. Atrophic appearance of the uterus.  IMPRESSION: 1. Evolving pancreatitis with better definition of pseudocysts with the largest in the lesser sac. 2. Resolution of superior mesenteric vein thrombosis. Portal venous system is patent on today's exam. No vascular complications of bowel. 3. No imaging features to suggest superinfection of pancreatic pseudocyst or phlegmon.   Electronically Signed   By: Dereck Ligas M.D.   On: 08/20/2014 12:22    Scheduled Meds: . citalopram  20 mg Oral Daily  . enoxaparin (LOVENOX) injection  1 mg/kg Subcutaneous Q12H  . feeding supplement (ENSURE ENLIVE)  237 mL Oral BID BM  . insulin aspart  0-15 Units Subcutaneous TID WC  . pantoprazole  40 mg Oral BID  . polyethylene glycol  17 g Oral Daily  . sodium chloride  10-40 mL Intracatheter Q12H   Continuous Infusions: . sodium chloride 0.9 % 1,000 mL with potassium chloride 20 mEq infusion 100 mL/hr at 08/21/14 8592    Principal Problem:   Pancreatitis, acute Active Problems:   Abdominal pain   Gallstones   Common bile duct dilatation   Leukocytosis   Acute pancreatitis   Acute gallstone pancreatitis   Thrombus   Other constipation   SIRS (systemic inflammatory response syndrome)   Hypokalemia   Hypophosphatemia   Constipated   Acute necrotizing pancreatitis    Time spent: 24 minutes    THOMPSON,DANIEL M.D. [facility] Pager [phone]. If 7PM-7AM, please contact night-coverage at [email or web address], password [facility] 08/21/2014, 2:24 PM  LOS: 15 days

## 2014-08-21 NOTE — Progress Notes (Signed)
ANTICOAGULATION CONSULT NOTE - F/u Consult  Pharmacy Consult for LMWH Indication: superior mesenteric vein thrombosis  No Known Allergies  Patient Measurements: Height: 5\' 5"  (165.1 cm) Weight: 178 lb 8 oz (80.967 kg) IBW/kg (Calculated) : 57   Vital Signs: Temp: 98.2 F (36.8 C) (04/25 0558) Temp Source: Oral (04/25 0558) BP: 121/64 mmHg (04/25 0558) Pulse Rate: 97 (04/25 0558)  Labs:  Recent Labs  08/19/14 0530 08/20/14 0450 08/21/14 1030  HGB 10.0* 10.6* 9.8*  HCT 30.4* 32.3* 30.2*  PLT 403* 498* 493*  CREATININE 0.71 0.75 0.69    Estimated Creatinine Clearance: 116 mL/min (by C-G formula based on Cr of 0.69).   Medical History: Past Medical History  Diagnosis Date  . Anxiety     diagnosed in 2012  . Contraceptive use     Had nexplanon - removed, now on OCP  . Dysmenorrhea     Medications:  Prescriptions prior to admission  Medication Sig Dispense Refill Last Dose  . citalopram (CELEXA) 20 MG tablet Take 20 mg by mouth daily.   08/06/2014 at Unknown time  . Cyanocobalamin (VITAMIN B-12 IJ) Inject 1 application into the skin every Monday, Wednesday, and Friday.   08/05/2014 at Unknown time  . ibuprofen (ADVIL,MOTRIN) 200 MG tablet Take 400 mg by mouth every 6 (six) hours as needed (pain).    few days ago  . phentermine (ADIPEX-P) 37.5 MG tablet Take 37.5 mg by mouth daily.   08/06/2014 at Unknown time  . PRESCRIPTION MEDICATION Inject 1 application into the skin every Monday, Wednesday, and Friday. HCG Injection   08/05/2014 at Unknown time  . PRESCRIPTION MEDICATION Inject 1 application into the skin every Monday, Wednesday, and Friday. Lipotropic injection   08/05/2014 at Unknown time  . rizatriptan (MAXALT) 10 MG tablet Take 10 mg by mouth daily as needed for migraine.    2 months ago    Assessment: 24 yo F with severe gallstone pancreatitis.  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  Plan for 3 months of LMWH for small segment  thrombus within the superior mesenteric vein, assumed to be reactive in nature due to severe pancreatitis. H/H remain low but stable, Plt elevated.    Wt down to 81kg.     Goal of Therapy:  DVT treatment  Monitor platelets by anticoagulation protocol: Yes   Plan:  -Change Lovenox to 80 mg sq q12h with plan to transition to DOAC when oral intake improves  -F/u renal function, CBC  Christoper Fabianaron Rileigh Kawashima, PharmD, BCPS Clinical pharmacist, pager [phone] 08/21/2014 11:45 AM

## 2014-08-21 NOTE — Progress Notes (Signed)
Physical Therapy Treatment Patient Details Name: [name] Blanchard MRN: [id number] DOB: 11-13-1990 Today's Date: 08/21/2014    History of Present Illness 24 y/o female admitted with necrotizing pancreatitis and gallstone. CT abdomen- -new pseudocyst of 10.6cm.    PT Comments    Patient progressing well with mobility. Continues to exhibit impaired endurance and fatigues easily requiring seated rest breaks and need for support during gait. Tolerated stair negotiation with Min guard assist for safety. Encouraged ambulation 3x/day with family/RN to improve strength and endurance. Will follow up to improve higher level balance, endurance and stair negotiation prior to discharge.   Follow Up Recommendations  Outpatient PT     Equipment Recommendations  Other (comment)    Recommendations for Other Services       Precautions / Restrictions Precautions Precautions: None Restrictions Weight Bearing Restrictions: No    Mobility  Bed Mobility Overal bed mobility: Modified Independent Bed Mobility: Supine to Sit;Sit to Supine     Supine to sit: Modified independent (Device/Increase time) Sit to supine: Modified independent (Device/Increase time)   General bed mobility comments: extra time  Transfers Overall transfer level: Modified independent Equipment used: None             General transfer comment: Extra time needed. Stood from [employer], from [employer], from toilet x1.   Ambulation/Gait Ambulation/Gait assistance: Supervision Ambulation Distance (Feet): 125 Feet (+200') Assistive device: None (rail towards end.) Gait Pattern/deviations: Step-through pattern;Decreased stride length;Wide base of support;Trunk flexed   Gait velocity interpretation: Below normal speed for age/gender General Gait Details: Fatigues easily requiring seated rest breaks and need to grab onto rail for support towards end of distance.    Stairs Stairs: Yes Stairs assistance: Min guard Stair  Management: Step to pattern;Forwards;One rail Right Number of Stairs: 6 General stair comments: Cues for technique. MIldly unsteady. Very slow. Pt fearful of falling. Limited by lines.  Wheelchair Mobility    Modified Rankin (Stroke Patients Only)       Balance Overall balance assessment: Needs assistance Sitting-balance support: Feet supported;No upper extremity supported Sitting balance-Leahy Scale: Good     Standing balance support: During functional activity Standing balance-Leahy Scale: Good                      Cognition Arousal/Alertness: Awake/alert Behavior During Therapy: WFL for tasks assessed/performed Overall Cognitive Status: Within Functional Limits for tasks assessed                      Exercises      General Comments        Pertinent Vitals/Pain Pain Assessment: No/denies pain    Home Living                      Prior Function            PT Goals (current goals can now be found in the care plan section) Progress towards PT goals: Progressing toward goals    Frequency  Min 3X/week    PT Plan Current plan remains appropriate    Co-evaluation             End of Session Equipment Utilized During Treatment: Gait belt Activity Tolerance: Patient limited by fatigue Patient left: in bed;with call bell/phone within reach;with family/visitor present     Time: 1349-1406 PT Time Calculation (min) (ACUTE ONLY): 17 min  Charges:  $Gait Training: 8-22 mins  G CodesAlvie Heidelberg:      Folan, Torryn Fiske A 08/21/2014, 2:43 PM Alvie HeidelbergShauna Folan, PT, DPT [phone]

## 2014-08-22 LAB — CBC
HEMATOCRIT: 30.9 % — AB (ref 36.0–46.0)
HEMOGLOBIN: 10.1 g/dL — AB (ref 12.0–15.0)
MCH: 29.4 pg (ref 26.0–34.0)
MCHC: 32.7 g/dL (ref 30.0–36.0)
MCV: 90.1 fL (ref 78.0–100.0)
PLATELETS: 495 10*3/uL — AB (ref 150–400)
RBC: 3.43 MIL/uL — ABNORMAL LOW (ref 3.87–5.11)
RDW: 13.3 % (ref 11.5–15.5)
WBC: 11.9 10*3/uL — ABNORMAL HIGH (ref 4.0–10.5)

## 2014-08-22 LAB — COMPREHENSIVE METABOLIC PANEL
ALBUMIN: 2.4 g/dL — AB (ref 3.5–5.2)
ALT: 19 U/L (ref 0–35)
AST: 24 U/L (ref 0–37)
Alkaline Phosphatase: 46 U/L (ref 39–117)
Anion gap: 11 (ref 5–15)
BUN: <5 mg/dL — ABNORMAL LOW (ref 6–23)
CALCIUM: 9 mg/dL (ref 8.4–10.5)
CO2: 26 mmol/L (ref 19–32)
Chloride: 100 mmol/L (ref 96–112)
Creatinine, Ser: 0.7 mg/dL (ref 0.50–1.10)
GFR calc Af Amer: >90 mL/min (ref >90)
GFR calc non Af Amer: >90 mL/min (ref >90)
Glucose, Bld: 100 mg/dL — ABNORMAL HIGH (ref 70–99)
Potassium: 4.4 mmol/L (ref 3.5–5.1)
Sodium: 137 mmol/L (ref 135–145)
TOTAL PROTEIN: 6.7 g/dL (ref 6.0–8.3)
Total Bilirubin: 0.6 mg/dL (ref 0.3–1.2)

## 2014-08-22 LAB — GLUCOSE, CAPILLARY
Glucose-Capillary: 113 mg/dL — ABNORMAL HIGH (ref 70–99)
Glucose-Capillary: 88 mg/dL (ref 70–99)
Glucose-Capillary: 89 mg/dL (ref 70–99)

## 2014-08-22 LAB — MAGNESIUM: Magnesium: 1.8 mg/dL (ref 1.5–2.5)

## 2014-08-22 LAB — PHOSPHORUS: Phosphorus: 5.1 mg/dL — ABNORMAL HIGH (ref 2.3–4.6)

## 2014-08-22 MED ORDER — PROMETHAZINE HCL 25 MG PO TABS
12.5000 mg | ORAL_TABLET | Freq: Four times a day (QID) | ORAL | Status: DC | PRN
  Administered 2014-08-23: 25 mg via ORAL
  Filled 2014-08-22: qty 1

## 2014-08-22 MED ORDER — BOOST / RESOURCE BREEZE PO LIQD
1.0000 | Freq: Two times a day (BID) | ORAL | Status: DC
  Administered 2014-08-22 – 2014-08-23 (×2): 1 via ORAL

## 2014-08-22 MED ORDER — ONDANSETRON HCL 4 MG PO TABS
4.0000 mg | ORAL_TABLET | ORAL | Status: DC | PRN
  Administered 2014-08-22 – 2014-08-24 (×5): 4 mg via ORAL
  Filled 2014-08-22 (×5): qty 1

## 2014-08-22 MED ORDER — RIVAROXABAN (XARELTO) EDUCATION KIT FOR DVT/PE PATIENTS
PACK | Freq: Once | Status: AC
  Administered 2014-08-23: 06:00:00
  Filled 2014-08-22: qty 1

## 2014-08-22 MED ORDER — RIVAROXABAN 15 MG PO TABS
15.0000 mg | ORAL_TABLET | Freq: Two times a day (BID) | ORAL | Status: DC
Start: 2014-08-22 — End: 2014-08-24
  Administered 2014-08-22 – 2014-08-24 (×4): 15 mg via ORAL
  Filled 2014-08-22 (×4): qty 1

## 2014-08-22 NOTE — Progress Notes (Signed)
NUTRITION FOLLOW UP  Intervention:   -D/c Ensure Enlive po BID, each supplement provides 350 kcal and 20 grams of protein -High Protein snacks TID -Resource Breeze po BID, each supplement provides 250 kcal and 9 grams of protein  Nutrition Dx:   Increased nutrient needs related to necrotizing pancreatitis as evidenced by estimated nutrition needs, ongoing  Goal:   Pt to meet >/= 90% of their estimated nutrition needs, met  Monitor:   PO/supplement intake, weight, labs, I/O's  Assessment:   24 y/o Female with with chronic intermittent abdominal pains for 6 month admitted with acute pancreatitis. Patient was transferred from [facility] ER where lipase was reportedly over 37,000, w/ mildly elevated lfts, nml alk phos and tbili; CT showing a stone in the gallbladder w/ GB wall thickening + edema around the pancreas w/ cbd dilation but with no stone.  TPN d/c on 08/16/14.  Spoke with pt who reports her appetite is improving, but complains of early satiety. She consumed all of her cranberry juice and her applesauce this AM. She is able to tolerate foods, but often eats small amounts of yogurt, juice, and applesauce frequently. She stopped drinking the Ensure because it "makes her feel full" and would rather eat than drink something.  RN reveals pt was just advanced to a Heart Healthy diet. Pt is hopeful she will eat more now that she has more food options.  Per surgery notes, hopeful that pseudocyast will resolve on its own. Not plans to drain unless it is infected or develops into an abcess. Plan is for elective cholecystectomy ina  Few weeks after pancreatitis has resolved.  Labs reviewed. BUN <5, Phos: 5.1, Glucose: 100. CBGS: 96-113.   Height: Ht Readings from Last 1 Encounters:  08/08/14 _0  (1.651 m)    Weight Status:   Wt Readings from Last 1 Encounters:  08/22/14 178 lb 9.6 oz (81.012 kg)   08/15/14 191 lb 12.8 oz (87 kg)    4/17201  lb 4/12201 lb 4/12192 lb 0/86578 lb     Re-estimated needs:  Kcal: 1850-2050 Protein: 95-105 grams Fluid: 1.9-2.1 L  Skin: ecchymosis on lt abdomen  Diet Order: Diet full liquid Room service appropriate?: Yes; Fluid consistency:: Thin   Intake/Output Summary (Last 24 hours) at 08/22/14 0940 Last data filed at 08/22/14 0600  Gross per 24 hour  Intake   2330 ml  Output      0 ml  Net   2330 ml    Last BM: 08/20/14   Labs:   Recent Labs Lab 08/18/14 0530  08/20/14 0450 08/21/14 1030 08/22/14 0517  NA 132*  < > 134* 133* 137  K 3.7  < > 4.3 4.2 4.4  CL 95*  < > 97 98 100  CO2 26  < > _1 BUN 7  < > <5* <5* <5*  CREATININE 0.74  < > 0.75 0.69 0.70  CALCIUM 8.3*  < > 8.8 8.8 9.0  MG 1.9  --   --   --  1.8  PHOS  --   --   --   --  5.1*  GLUCOSE 133*  < > 110* 137* 100*  < > = values in this interval not displayed.  CBG (last 3)   Recent Labs  08/21/14 1302 08/21/14 2214 08/22/14 0815  GLUCAP 112* 96 113*    Scheduled Meds: . citalopram  20 mg Oral Daily  . enoxaparin (LOVENOX) injection  1 mg/kg Subcutaneous Q12H  . feeding  supplement (ENSURE ENLIVE)  237 mL Oral BID BM  . insulin aspart  0-15 Units Subcutaneous TID WC  . pantoprazole  40 mg Oral BID  . polyethylene glycol  17 g Oral Daily  . sodium chloride  10-40 mL Intracatheter Q12H    Continuous Infusions: . sodium chloride 0.9 % 1,000 mL with potassium chloride 20 mEq infusion 100 mL/hr at 08/22/14 1884    Deontrae Drinkard A. Jimmye Norman, RD, LDN, CDE Pager: [phone] After hours Pager: [phone]

## 2014-08-22 NOTE — Progress Notes (Signed)
[facility] PROGRESS NOTE  [name] Blanchard MVE:[id number] DOB: January 25, 1991 DOA: 08/06/2014 PCP: No PCP Per Patient   Admit HPI / Brief Narrative: 24 y/o female with with chronic intermittent abdominal pain for 6 months admitted with acute pancreatitis. Patient was transferred from [facility] ER where lipase was reportedly over 37,000, w/ mildly elevated lfts, nml alk phos and tbili; and a CT showing a stone in the gallbladder w/ GB wall thickening + edema around the pancreas w/ cbd dilation but with no stone.  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 Patient was placed on anticoagulation after discussion with hematology. Patient has improved clinically. Patient off antibiotics. Patient's diet has been advanced to a soft diet. Patient's pain is being managed. Patient has also been transitioned to NOAC. Improving daily.     Assessment/Plan: Acute severe gallstone pancreatitis/nectrozing pancreatitis. 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 - TNA has been discontinued. Antibiotics have been discontinued. WBC is trending down.  Repeat CT of the abdomen and pelvis with pseudocyst formation, resolution of superior mesenteric vein thrombosis, no imaging features to suggest superinfection of pancreatic pseudocyst or phlegmon. - currently off PCA pump. On IV pain medication every 2 hours when necessary. Patient with nausea when taking Percocet as well. Anti-medics have been ordered per general surgery to be given at the same time as Percocet. General surgery following.  Small segment  thrombus within the superior mesenteric vein  Assumed to be reactive in nature due to severe pancreatitis - Dr. Thereasa Solo discussed w/ Hematology who agree that latest studies have concluded that short term treatment results in the best outcome for the pt by significantly lowering risk of progression of thrombus to complete occlusion as well as further propagation resulting in bowel ischemia or organ damage - will plan for a 3 month course as the cause is clearly identifiable/reversible (pancreatitis) - this tx could be interrupted for GB removal w/o need for lovenox overlap as a NOAC will be the tx of choice as oral intake becomes more reliable. Repeat CT of the abdomen and pelvis shows resolution of thrombus. Will discontinue full dose Lovenox and start patient on xarelto with goals of treatment for 3 months. Discussed with hematology.  Constipation Secondary to PCA - resolved at present. Continue bowel regimen of MiraLAX.  SIRS in the setting of acute pancreatitis SIRS physiology persisted early on in the hospitalization with with persistent sinus tachycardia and markedly elevated white blood cell count but pt is hemodynamically stable - fever curve is slowly trending back down. - Fever Likely related to noninfectious inflammation, which has trended down. Heart rate has improved. Antibiotics have been discontinued. Repeat CT abdomen and pelvis with evolving pancreatitis with some pseudocyst formation. Resolution of superior mesenteric vein thrombosis. No imaging features to suggest superinfection of pancreatic pseudocyst or phlegmon. Diet has been advanced. Follow.   Hypokalemia Corrected with TNA - follow.  Hypophosphatemia Corrected with TNA - follow.  Nutrition TNA has been discontinued. Patient currently tolerating full liquid diet which has been advanced to a soft diet per general surgery.   Obesity - Body mass index is 31.66 kg/(m^2).   Code Status: Full Family Communication: Updated  patient. No family present. Disposition Plan: Remain inpatient.   Consultants:  General surgery  Procedures: HIDA - 4/13 - unremarkable   Antibiotics: Zosyn 4/10 > 4/18 Primaxin 4/19>>> 08/16/2014   HPI/Subjective: Patient states abdominal discomfort improved. No emesis. No SOB. Patient with some nausea with Percocet. Patient tolerating current diet.   Objective: Filed Vitals:   08/22/14 1000  BP: 125/70  Pulse: 71  Temp: 97.5 F (36.4 C)  Resp: 17    Intake/Output Summary (Last 24 hours) at 08/22/14 1414 Last data filed at 08/22/14 0600  Gross per 24 hour  Intake   1920 ml  Output      0 ml  Net   1920 ml   Filed Weights   08/20/14 0504 08/21/14 0500 08/22/14 0534  Weight: 85.5 kg (188 lb 7.9 oz) 80.967 kg (178 lb 8 oz) 81.012 kg (178 lb 9.6 oz)    Exam:   General:  NAD  Cardiovascular: RRR  Respiratory: Clear to auscultation bilaterally. No wheezing, no crackles, no rhonchi.  Abdomen: Soft, mildly distended, less tender to palpation in the upper abdomen, positive bowel sounds.  Musculoskeletal: No clubbing or cyanosis or edema.  Data Reviewed: Basic Metabolic Panel:  Recent Labs Lab 08/18/14 0530 08/19/14 0530 08/20/14 0450 08/21/14 1030 08/22/14 0517  NA 132* 134* 134* 133* 137  K 3.7 3.7 4.3 4.2 4.4  CL 95* 95* 97 98 100  CO2 _0 GLUCOSE 133* 124* 110* 137* 100*  BUN 7 6 <5* <5* <5*  CREATININE 0.74 0.71 0.75 0.69 0.70  CALCIUM 8.3* 8.7 8.8 8.8 9.0  MG 1.9  --   --   --  1.8  PHOS  --   --   --   --  5.1*   Liver Function Tests:  Recent Labs Lab 08/17/14 0455 08/18/14 0530 08/21/14 1030 08/22/14 0517  AST _1 ALT _2 ALKPHOS 69 61 49 46  BILITOT 0.9 0.8 0.6 0.6  PROT 6.3 6.3 6.7 6.7  ALBUMIN 2.2* 2.1* 2.4* 2.4*   No results for input(s): LIPASE, AMYLASE in the last 168 hours. No results for input(s): AMMONIA in the last 168 hours. CBC:  Recent Labs Lab 08/18/14 0530 08/19/14 0530  08/20/14 0450 08/21/14 1030 08/22/14 0517  WBC 22.1* 19.5* 22.1* 16.1* 11.9*  NEUTROABS 18.1*  --   --  12.7*  --   HGB 10.2* 10.0* 10.6* 9.8* 10.1*  HCT 30.8* 30.4* 32.3* 30.2* 30.9*  MCV 88.5 89.7 90.2 89.9 90.1  PLT 341 403* 498* 493* 495*   Cardiac Enzymes: No results for input(s): CKTOTAL, CKMB, CKMBINDEX, TROPONINI in the last 168 hours. BNP (last 3 results) No results for input(s): BNP in the last 8760 hours.  ProBNP (last 3 results) No results for input(s): PROBNP in the last 8760 hours.  CBG:  Recent Labs Lab 08/21/14 0748 08/21/14 1302 08/21/14 2214 08/22/14 0815 08/22/14 1231  GLUCAP 98 112* 96 113* 88    No results found for this or any previous visit (from the past 240 hour(s)).   Studies: No results found.  Scheduled Meds: . citalopram  20 mg Oral Daily  . enoxaparin (LOVENOX) injection  1 mg/kg Subcutaneous Q12H  . feeding supplement (RESOURCE BREEZE)  1 Container Oral BID BM  . insulin aspart  0-15 Units Subcutaneous TID WC  . pantoprazole  40 mg Oral BID  . polyethylene glycol  17 g Oral Daily  . sodium chloride  10-40 mL Intracatheter Q12H   Continuous Infusions: . sodium chloride 0.9 % 1,000 mL with potassium chloride 20 mEq infusion 100 mL/hr at 08/22/14 1410    Principal Problem:   Pancreatitis, acute Active Problems:   Abdominal pain   Gallstones   Common bile duct dilatation   Leukocytosis   Acute pancreatitis   Acute gallstone pancreatitis   Thrombus   Other constipation   SIRS (systemic inflammatory response syndrome)   Hypokalemia   Hypophosphatemia   Constipated   Acute necrotizing pancreatitis    Time spent: 40 minutes    [name] Blanchard M.D. [facility] Pager [phone]. If 7PM-7AM, please contact night-coverage at [email or web address], password [facility] 08/22/2014, 2:14 PM  LOS: 16 days

## 2014-08-22 NOTE — Progress Notes (Signed)
Pt's boyfriend brought pt's dog here to visit.  Vaccination records also here with dog and dog is up to date on all vaccinations.  Will continue to monitor. Sherald BargeSpencer, Keondra Haydu T

## 2014-08-22 NOTE — Progress Notes (Signed)
Patient ID: [name] Blanchard, female   DOB: 10-Aug-1990, 24 y.o.   MRN: [id number]    Subjective: Pt feels better today.  Tolerating her fulls and ready to eat more solid food.  Pain is well controlled with percocet.  Percocet does cause some nausea  Objective: Vital signs in last 24 hours: Temp:  [97.5 F (36.4 C)-98.1 F (36.7 C)] 97.5 F (36.4 C) (04/26 0534) Pulse Rate:  [78-94] 94 (04/26 0534) Resp:  [16-18] 16 (04/26 0534) BP: (111-130)/(64-69) 120/69 mmHg (04/26 0534) SpO2:  [98 %-100 %] 98 % (04/26 0534) Weight:  [81.012 kg (178 lb 9.6 oz)] 81.012 kg (178 lb 9.6 oz) (04/26 0534) Last BM Date: 08/20/14  Intake/Output from previous day: 04/25 0701 - 04/26 0700 In: 2430 [P.O.:120; I.V.:2310] Out: -  Intake/Output this shift:    PE: Abd: soft, less tender, +BS, ND Heart: regular Lungs: CTAB  Lab Results:   Recent Labs  08/21/14 1030 08/22/14 0517  WBC 16.1* 11.9*  HGB 9.8* 10.1*  HCT 30.2* 30.9*  PLT 493* 495*   BMET  Recent Labs  08/21/14 1030 08/22/14 0517  NA 133* 137  K 4.2 4.4  CL 98 100  CO2 26 26  GLUCOSE 137* 100*  BUN <5* <5*  CREATININE 0.69 0.70  CALCIUM 8.8 9.0   PT/INR No results for input(s): LABPROT, INR in the last 72 hours. CMP     Component Value Date/Time   NA 137 08/22/2014 0517   K 4.4 08/22/2014 0517   CL 100 08/22/2014 0517   CO2 26 08/22/2014 0517   GLUCOSE 100* 08/22/2014 0517   BUN <5* 08/22/2014 0517   CREATININE 0.70 08/22/2014 0517   CALCIUM 9.0 08/22/2014 0517   PROT 6.7 08/22/2014 0517   ALBUMIN 2.4* 08/22/2014 0517   AST 24 08/22/2014 0517   ALT 19 08/22/2014 0517   ALKPHOS 46 08/22/2014 0517   BILITOT 0.6 08/22/2014 0517   GFRNONAA >90 08/22/2014 0517   GFRAA >90 08/22/2014 0517   Lipase     Component Value Date/Time   LIPASE 40 08/13/2014 0430       Studies/Results: Ct Abdomen Pelvis W Contrast  08/20/2014   CLINICAL DATA:  Necrotizing pancreatitis. Superior mesenteric vein clot. No anti  coagulation. Reassess clot and pseudocyst. Evolving pancreatitis. Persistent leukocytosis.  EXAM: CT ABDOMEN AND PELVIS WITH CONTRAST  TECHNIQUE: Multidetector CT imaging of the abdomen and pelvis was performed using the standard protocol following bolus administration of intravenous contrast.  CONTRAST:  OMNIPAQUE IOHEXOL 300 MG/ML  SOLN  COMPARISON:  None.  FINDINGS: Evolving pancreatitis is present. Compared to the prior exam 08/14/2014, there is better definition of the pseudocyst along the anterior and inferior aspect of the pancreas. There is almost no enhancing pancreatic neck tissue which could produce bridging pancreatitis and attention on follow-up is recommended. Fluid in the pericolic gutters is become more nodular, probably representing Organization.  There is no convincing evidence of abscess or superinfection of pancreatic pseudocysts. The largest developing pseudocyst is now developing a wall and resides in the lesser sac, measuring 10.6 cm transverse by 4.5 cm AP. Smaller components of this are present inferiorly. In aggregate, the craniocaudal dimension of the peripancreatic pseudocysts is 10.6 cm.  The lung bases show atelectasis. Fatty infiltration at the falciform ligament of the liver. Punctate high-density areas present in the gallbladder compatible with a small gallstone. The gallbladder is partially contracted.  The splenic vein remains patent. There is mass effect on the portal system at the  porta splenic confluence. There is no longer are any mesenteric thrombus identified.  No secondary findings of venous ischemia or infarct in the bowel.  Urinary bladder is distended. Small amount of free fluid in the anatomic pelvis. Atrophic appearance of the uterus.  IMPRESSION: 1. Evolving pancreatitis with better definition of pseudocysts with the largest in the lesser sac. 2. Resolution of superior mesenteric vein thrombosis. Portal venous system is patent on today's exam. No vascular  complications of bowel. 3. No imaging features to suggest superinfection of pancreatic pseudocyst or phlegmon.   Electronically Signed   By: Andreas NewportGeoffrey  Lamke M.D.   On: 08/20/2014 12:22    Anti-infectives: Anti-infectives    Start     Dose/Rate Route Frequency Ordered Stop   08/15/14 0900  imipenem-cilastatin (PRIMAXIN) 500 mg in sodium chloride 0.9 % 100 mL IVPB  Status:  Discontinued     500 mg 200 mL/hr over 30 Minutes Intravenous 4 times per day 08/15/14 0821 08/16/14 1158   08/07/14 0400  piperacillin-tazobactam (ZOSYN) IVPB 3.375 g  Status:  Discontinued     3.375 g 12.5 mL/hr over 240 Minutes Intravenous Every 8 hours 08/06/14 1910 08/15/14 0816   08/06/14 1930  piperacillin-tazobactam (ZOSYN) IVPB 3.375 g     3.375 g 12.5 mL/hr over 240 Minutes Intravenous  Once 08/06/14 1909 08/06/14 2340       Assessment/Plan  HD #16 Severe necrotizing gallstone pancreatitis with pseudocyst -CT shows new pseudocyst of 10.6cm. There is no evidence of infection or abscess. This is felt to likely be simple fluid. No further course of action needs to be taken for this right now as this will likely resolve on its own. If it develops into an abscess or infected, then she will likely need it drained -Tolerating full liquids,will advance to low fat det -add oral antiemetics to help with nausea from percocet Leukocytosis -WBC down to 11.9 Small segment thrombus within the superior mesenteric vein  -this has resolved on this CT scan.  Obesity - BMI 35 PCM -cont to try and advance diet. If we keep advancing and having to decrease, she may need a PANDA tube for more consistent nutrition. Will follow and see how she does. -cont Ensure   LOS: 16 days    OSBORNE,[name] E 08/22/2014, 9:49 AM Pager: [phone] Patient seen and I agree. Wenda LowMatt Tanishi Nault

## 2014-08-22 NOTE — Progress Notes (Signed)
ANTICOAGULATION CONSULT NOTE - F/u Consult  Pharmacy Consult for LMWH --> Xarelto Indication: superior mesenteric vein thrombosis  No Known Allergies  Patient Measurements: Height: 5\' 5"  (165.1 cm) Weight: 178 lb 9.6 oz (81.012 kg) IBW/kg (Calculated) : 57   Vital Signs: Temp: 97.5 F (36.4 C) (04/26 1000) Temp Source: Oral (04/26 1000) BP: 125/70 mmHg (04/26 1000) Pulse Rate: 71 (04/26 1000)  Labs:  Recent Labs  08/20/14 0450 08/21/14 1030 08/22/14 0517  HGB 10.6* 9.8* 10.1*  HCT 32.3* 30.2* 30.9*  PLT 498* 493* 495*  CREATININE 0.75 0.69 0.70    Estimated Creatinine Clearance: 116 mL/min (by C-G formula based on Cr of 0.7).   Medical History: Past Medical History  Diagnosis Date  . Anxiety     diagnosed in 2012  . Contraceptive use     Had nexplanon - removed, now on OCP  . Dysmenorrhea     Medications:  Prescriptions prior to admission  Medication Sig Dispense Refill Last Dose  . citalopram (CELEXA) 20 MG tablet Take 20 mg by mouth daily.   08/06/2014 at Unknown time  . Cyanocobalamin (VITAMIN B-12 IJ) Inject 1 application into the skin every Monday, Wednesday, and Friday.   08/05/2014 at Unknown time  . ibuprofen (ADVIL,MOTRIN) 200 MG tablet Take 400 mg by mouth every 6 (six) hours as needed (pain).    few days ago  . phentermine (ADIPEX-P) 37.5 MG tablet Take 37.5 mg by mouth daily.   08/06/2014 at Unknown time  . PRESCRIPTION MEDICATION Inject 1 application into the skin every Monday, Wednesday, and Friday. HCG Injection   08/05/2014 at Unknown time  . PRESCRIPTION MEDICATION Inject 1 application into the skin every Monday, Wednesday, and Friday. Lipotropic injection   08/05/2014 at Unknown time  . rizatriptan (MAXALT) 10 MG tablet Take 10 mg by mouth daily as needed for migraine.    2 months ago    Assessment: 24 yo F with severe gallstone pancreatitis.  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  Plan for 3 months of LMWH for small  segment thrombus within the superior mesenteric vein, assumed to be reactive in nature due to severe pancreatitis. H/H remain low but stable, Plt elevated.    Wt down to 81kg.     PM: Change to Xarelto  Goal of Therapy:  DVT treatment  Monitor platelets by anticoagulation protocol: Yes   Plan:  DC Lovenox Start Xarelto 15 mg po BID for 3 weeks, then decrease to Xarelto 20 mg po daily  Thank you. Okey RegalLisa Adelai Achey, PharmD [phone] 08/22/2014 5:20 PM

## 2014-08-23 LAB — CBC
HCT: 29.5 % — ABNORMAL LOW (ref 36.0–46.0)
Hemoglobin: 9.4 g/dL — ABNORMAL LOW (ref 12.0–15.0)
MCH: 28.8 pg (ref 26.0–34.0)
MCHC: 31.9 g/dL (ref 30.0–36.0)
MCV: 90.5 fL (ref 78.0–100.0)
Platelets: 465 10*3/uL — ABNORMAL HIGH (ref 150–400)
RBC: 3.26 MIL/uL — ABNORMAL LOW (ref 3.87–5.11)
RDW: 13 % (ref 11.5–15.5)
WBC: 10.5 10*3/uL (ref 4.0–10.5)

## 2014-08-23 LAB — COMPREHENSIVE METABOLIC PANEL
ALT: 16 U/L (ref 0–35)
ANION GAP: 8 (ref 5–15)
AST: 19 U/L (ref 0–37)
Albumin: 2.3 g/dL — ABNORMAL LOW (ref 3.5–5.2)
Alkaline Phosphatase: 41 U/L (ref 39–117)
CALCIUM: 8.7 mg/dL (ref 8.4–10.5)
CO2: 27 mmol/L (ref 19–32)
Chloride: 101 mmol/L (ref 96–112)
Creatinine, Ser: 0.76 mg/dL (ref 0.50–1.10)
GFR calc non Af Amer: >90 mL/min (ref >90)
GLUCOSE: 100 mg/dL — AB (ref 70–99)
Potassium: 4.1 mmol/L (ref 3.5–5.1)
SODIUM: 136 mmol/L (ref 135–145)
Total Bilirubin: 0.4 mg/dL (ref 0.3–1.2)
Total Protein: 6.4 g/dL (ref 6.0–8.3)

## 2014-08-23 LAB — GLUCOSE, CAPILLARY
GLUCOSE-CAPILLARY: 113 mg/dL — AB (ref 70–99)
GLUCOSE-CAPILLARY: 86 mg/dL (ref 70–99)
Glucose-Capillary: 101 mg/dL — ABNORMAL HIGH (ref 70–99)
Glucose-Capillary: 98 mg/dL (ref 70–99)

## 2014-08-23 MED ORDER — RIVAROXABAN 20 MG PO TABS: 20.0000 mg | ORAL_TABLET | Freq: Every day | ORAL | Status: DC

## 2014-08-23 MED ORDER — OXYCODONE HCL 5 MG PO TABS
15.0000 mg | ORAL_TABLET | ORAL | Status: DC | PRN
  Administered 2014-08-23 – 2014-08-24 (×4): 15 mg via ORAL
  Filled 2014-08-23 (×4): qty 3

## 2014-08-23 MED ORDER — OXYCODONE HCL 5 MG PO TABS: 5.0000 mg | ORAL_TABLET | ORAL | Status: DC | PRN

## 2014-08-23 NOTE — Progress Notes (Signed)
Physical Therapy Treatment Patient Details Name: [name] Blanchard MRN: [id number] DOB: 05/09/1990 Today's Date: 08/23/2014    History of Present Illness 24 y/o female admitted with necrotizing pancreatitis and gallstone. CT abdomen- -new pseudocyst of 10.6cm.    PT Comments    Pt progressing well towards PT goals.  Pt demonstrated min guard level function with ambulation and stair navigation, min guard for IV pole management only.  Current plan for d/c home with outpatient PT remains appropriate once pt is medically cleared.  Acute PT will follow up to address remaining deficits until pt is medically cleared.    Follow Up Recommendations  Outpatient PT     Equipment Recommendations  None recommended by PT    Recommendations for Other Services       Precautions / Restrictions Precautions Precautions: None Restrictions Weight Bearing Restrictions: No    Mobility  Bed Mobility Overal bed mobility: Modified Independent Bed Mobility: Supine to Sit     Supine to sit: Modified independent (Device/Increase time)     General bed mobility comments: HOB elevated  Transfers Overall transfer level: Modified independent Equipment used: None             General transfer comment: No v/c's needed for hand placement, able to stand from bed with safe technique  Ambulation/Gait Ambulation/Gait assistance: Supervision;Min guard (Min guard for IV pole management) Ambulation Distance (Feet): 550 Feet Assistive device: None Gait Pattern/deviations: Decreased stride length;Step-through pattern;Wide base of support Gait velocity: Extremely slow for age Gait velocity interpretation: Below normal speed for age/gender General Gait Details: No seated rest breaks required today, able to ambulate 550 feet slowly but steadily.  Pt has wide base of support with gait and has decreased stride length, contributing to slow speed.    Stairs Stairs: Yes Stairs assistance: Min guard Stair  Management: No rails Number of Stairs: 3 General stair comments: No v/c's needed, min guard for IV pole management only.  Pt demonstrated safe navigation of three stairs with no handrail.  Wheelchair Mobility    Modified Rankin (Stroke Patients Only)       Balance Overall balance assessment: Needs assistance         Standing balance support: No upper extremity supported;During functional activity Standing balance-Leahy Scale: Good Standing balance comment: Wide BOS with ambulation and standing functional activities                    Cognition Arousal/Alertness: Awake/alert Behavior During Therapy: WFL for tasks assessed/performed Overall Cognitive Status: Within Functional Limits for tasks assessed                      Exercises      General Comments        Pertinent Vitals/Pain Pain Assessment: 0-10 Pain Score: 4  Pain Location: Abdomen Pain Descriptors / Indicators: Constant Pain Intervention(s): Monitored during session    Home Living                      Prior Function            PT Goals (current goals can now be found in the care plan section) Acute Rehab PT Goals 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

## 2014-08-23 NOTE — Progress Notes (Signed)
[facility] PROGRESS NOTE  [name] Blanchard ZOX:[id number] DOB: May 04, 1990 DOA: 08/06/2014 PCP: No PCP Per Patient   Admit HPI / Brief Narrative: 24 y/o female with with chronic intermittent abdominal pain for 6 months admitted with acute pancreatitis. Patient was transferred from [facility] ER where lipase was reportedly over 37,000, w/ mildly elevated lfts, nml alk phos and tbili; and a CT showing a stone in the gallbladder w/ GB wall thickening + edema around the pancreas w/ cbd dilation but with no stone.  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 Patient was placed on anticoagulation after discussion with hematology. Patient has improved clinically. Patient off antibiotics. Patient's diet has been advanced to a soft diet. Patient's pain is being managed. Patient has also been transitioned to NOAC. Improving daily.     Assessment/Plan: Acute severe gallstone pancreatitis/nectrozing pancreatitis. Lipase has normalized - Gen Surg following - follow-up CT noted partial pancreatic necrosis and persistent/increased peripancreatic inflammatory change - additionally noted was a small segment thrombus within the superior mesenteric vein though the portal and splenic veins appeared patent.  Patient reports pain is well controlled and she is able to tolerate diet.  -TNA has been discontinued. Antibiotics have been discontinued. WBC is trending down.  Repeat CT of the abdomen and pelvis with pseudocyst formation, resolution of superior mesenteric vein thrombosis, no imaging features to suggest superinfection of pancreatic pseudocyst or phlegmon. - currently off PCA pump. Pain control as needed. General surgery following.  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 Thereasa Solo discussed w/ Hematology who agree that latest studies have  concluded that short term treatment results in the best outcome for the pt by significantly lowering risk of progression of thrombus to complete occlusion as well as further propagation resulting in bowel ischemia or organ damage - will plan for a 3 month course as the cause is clearly identifiable/reversible (pancreatitis) - this tx could be interrupted for GB removal w/o need for lovenox overlap as a NOAC will be the tx of choice as oral intake becomes more reliable. Repeat CT of the abdomen and pelvis shows resolution of thrombus. Will discontinue full dose Lovenox and start patient on xarelto with goals of treatment for 3 months. Discussed with hematology.  Constipation Secondary to PCA - resolved at present. Continue bowel regimen of MiraLAX.  SIRS in the setting of acute pancreatitis SIRS physiology persisted early on in the hospitalization with with persistent sinus tachycardia and markedly elevated white blood cell count but pt is hemodynamically stable - fever curve is slowly trending back down. - Fever Likely related to noninfectious inflammation, which has trended down. Heart rate has improved. Antibiotics have been discontinued. Repeat CT abdomen and pelvis with evolving pancreatitis with some pseudocyst formation. Resolution of superior mesenteric vein thrombosis. No imaging features to suggest superinfection of pancreatic pseudocyst or phlegmon. Diet has been advanced. Follow.   Hypokalemia Corrected with TNA - follow.  Hypophosphatemia Corrected with TNA - follow.  Nutrition TNA has been discontinued. Patient currently tolerating regular diet which has been advanced to a soft diet per general surgery.   Obesity - Body mass index is 31.66 kg/(m^2).   Code Status: Full Family Communication: Updated patient. No family present. Disposition Plan: d/c in am.   Consultants:  General surgery  Procedures: HIDA - 4/13 - unremarkable   Antibiotics: Zosyn 4/10 > 4/18 Primaxin  4/19>>> 08/16/2014  HPI/Subjective: Patient states abdominal discomfort improved. No emesis. No SOB. Patient with some nausea with Percocet. Patient tolerating current diet.   Objective: Filed Vitals:   08/23/14 1358  BP: 111/72  Pulse: 88  Temp: 97.9 F (36.6 C)  Resp: 19    Intake/Output Summary (Last 24 hours) at 08/23/14 1816 Last data filed at 08/23/14 1400  Gross per 24 hour  Intake   3220 ml  Output      0 ml  Net   3220 ml   Filed Weights   08/21/14 0500 08/22/14 0534 08/23/14 0527  Weight: 80.967 kg (178 lb 8 oz) 81.012 kg (178 lb 9.6 oz) 81.012 kg (178 lb 9.6 oz)    Exam:   General:  NAD  Cardiovascular: RRR  Respiratory: Clear to auscultation bilaterally. No wheezing, no crackles, no rhonchi.  Abdomen: Soft, mildly distended, less tender to palpation in the upper abdomen, positive bowel sounds.  Musculoskeletal: No clubbing or cyanosis or edema.  Data Reviewed: Basic Metabolic Panel:  Recent Labs Lab 08/18/14 0530 08/19/14 0530 08/20/14 0450 08/21/14 1030 08/22/14 0517 08/23/14 0510  NA 132* 134* 134* 133* 137 136  K 3.7 3.7 4.3 4.2 4.4 4.1  CL 95* 95* 97 98 100 101  CO2 _0 GLUCOSE 133* 124* 110* 137* 100* 100*  BUN 7 6 <5* <5* <5* <5*  CREATININE 0.74 0.71 0.75 0.69 0.70 0.76  CALCIUM 8.3* 8.7 8.8 8.8 9.0 8.7  MG 1.9  --   --   --  1.8  --   PHOS  --   --   --   --  5.1*  --    Liver Function Tests:  Recent Labs Lab 08/17/14 0455 08/18/14 0530 08/21/14 1030 08/22/14 0517 08/23/14 0510  AST _1 ALT _2 ALKPHOS 69 61 49 46 41  BILITOT 0.9 0.8 0.6 0.6 0.4  PROT 6.3 6.3 6.7 6.7 6.4  ALBUMIN 2.2* 2.1* 2.4* 2.4* 2.3*   No results for input(s): LIPASE, AMYLASE in the last 168 hours. No results for input(s): AMMONIA in the last 168 hours. CBC:  Recent Labs Lab 08/18/14 0530 08/19/14 0530 08/20/14 0450 08/21/14 1030 08/22/14 0517 08/23/14 0510  WBC 22.1* 19.5* 22.1* 16.1* 11.9* 10.5   NEUTROABS 18.1*  --   --  12.7*  --   --   HGB 10.2* 10.0* 10.6* 9.8* 10.1* 9.4*  HCT 30.8* 30.4* 32.3* 30.2* 30.9* 29.5*  MCV 88.5 89.7 90.2 89.9 90.1 90.5  PLT 341 403* 498* 493* 495* 465*   Cardiac Enzymes: No results for input(s): CKTOTAL, CKMB, CKMBINDEX, TROPONINI in the last 168 hours. BNP (last 3 results) No results for input(s): BNP in the last 8760 hours.  ProBNP (last 3 results) No results for input(s): PROBNP in the last 8760 hours.  CBG:  Recent Labs Lab 08/22/14 1231 08/22/14 1703 08/23/14 0743 08/23/14 1147 08/23/14 1653  GLUCAP 88 89 101* 113* 98    No results found for this or any previous visit (from the past 240 hour(s)).   Studies: No results found.  Scheduled Meds: . citalopram  20 mg Oral Daily  . feeding supplement (RESOURCE BREEZE)  1 Container Oral BID BM  . insulin aspart  0-15 Units Subcutaneous TID WC  . pantoprazole  40 mg Oral BID  . polyethylene glycol  17 g Oral Daily  . Rivaroxaban  15 mg Oral BID WC  . [START ON 09/13/2014]  rivaroxaban  20 mg Oral Q supper  . sodium chloride  10-40 mL Intracatheter Q12H   Continuous Infusions: . sodium chloride 0.9 % 1,000 mL with potassium chloride 20 mEq infusion 100 mL/hr at 08/23/14 0122    Principal Problem:   Pancreatitis, acute Active Problems:   Abdominal pain   Gallstones   Common bile duct dilatation   Leukocytosis   Acute pancreatitis   Acute gallstone pancreatitis   Thrombus   Other constipation   SIRS (systemic inflammatory response syndrome)   Hypokalemia   Hypophosphatemia   Constipated   Acute necrotizing pancreatitis    Time spent: 65 minutes    [name] Blanchard M.D. [facility] Pager [phone]. If 7PM-7AM, please contact night-coverage at [email or web address], password [facility] 08/23/2014, 6:16 PM  LOS: 17 days

## 2014-08-23 NOTE — Progress Notes (Signed)
Patient ID: [name] Blanchard, female   DOB: 1990-12-16, 24 y.o.   MRN: [id number]    Subjective: Pt doing ok.  Tolerating her regular diet.  Having some pain control issues.  Objective: Vital signs in last 24 hours: Temp:  [97.5 F (36.4 C)-98.8 F (37.1 C)] 98.1 F (36.7 C) (04/27 0527) Pulse Rate:  [77-78] 77 (04/27 0527) Resp:  [17-19] 19 (04/27 0527) BP: (103-130)/(48-68) 103/48 mmHg (04/27 0527) SpO2:  [100 %] 100 % (04/27 0527) Weight:  [81.012 kg (178 lb 9.6 oz)] 81.012 kg (178 lb 9.6 oz) (04/27 0527) Last BM Date: 08/22/14  Intake/Output from previous day: 04/26 0701 - 04/27 0700 In: 2740 [P.O.:240; I.V.:2500] Out: -  Intake/Output this shift: Total I/O In: 240 [P.O.:240] Out: -   PE: Abd: soft, mildly tender in LUQ, still with some fullness, +BS,  Heart: regular Lungs: CTAB  Lab Results:   Recent Labs  08/22/14 0517 08/23/14 0510  WBC 11.9* 10.5  HGB 10.1* 9.4*  HCT 30.9* 29.5*  PLT 495* 465*   BMET  Recent Labs  08/22/14 0517 08/23/14 0510  NA 137 136  K 4.4 4.1  CL 100 101  CO2 26 27  GLUCOSE 100* 100*  BUN <5* <5*  CREATININE 0.70 0.76  CALCIUM 9.0 8.7   PT/INR No results for input(s): LABPROT, INR in the last 72 hours. CMP     Component Value Date/Time   NA 136 08/23/2014 0510   K 4.1 08/23/2014 0510   CL 101 08/23/2014 0510   CO2 27 08/23/2014 0510   GLUCOSE 100* 08/23/2014 0510   BUN <5* 08/23/2014 0510   CREATININE 0.76 08/23/2014 0510   CALCIUM 8.7 08/23/2014 0510   PROT 6.4 08/23/2014 0510   ALBUMIN 2.3* 08/23/2014 0510   AST 19 08/23/2014 0510   ALT 16 08/23/2014 0510   ALKPHOS 41 08/23/2014 0510   BILITOT 0.4 08/23/2014 0510   GFRNONAA >90 08/23/2014 0510   GFRAA >90 08/23/2014 0510   Lipase     Component Value Date/Time   LIPASE 40 08/13/2014 0430       Studies/Results: No results found.  Anti-infectives: Anti-infectives    Start     Dose/Rate Route Frequency Ordered Stop   08/15/14 0900   imipenem-cilastatin (PRIMAXIN) 500 mg in sodium chloride 0.9 % 100 mL IVPB  Status:  Discontinued     500 mg 200 mL/hr over 30 Minutes Intravenous 4 times per day 08/15/14 0821 08/16/14 1158   08/07/14 0400  piperacillin-tazobactam (ZOSYN) IVPB 3.375 g  Status:  Discontinued     3.375 g 12.5 mL/hr over 240 Minutes Intravenous Every 8 hours 08/06/14 1910 08/15/14 0816   08/06/14 1930  piperacillin-tazobactam (ZOSYN) IVPB 3.375 g     3.375 g 12.5 mL/hr over 240 Minutes Intravenous  Once 08/06/14 1909 08/06/14 2340       Assessment/Plan  HD #17 Severe necrotizing gallstone pancreatitis with pseudocyst -CT shows new pseudocyst of 10.6cm. There is no evidence of infection or abscess. This is felt to likely be simple fluid. No further course of action needs to be taken for this right now as this will likely resolve on its own. If it develops into an abscess or infected, then she will likely need it drained -Tolerating low fat det -adjust percocet to oxycodone 5-15mg  to help with pain control. Leukocytosis -WBC normal Small segment thrombus within the superior mesenteric vein  -this has resolved on this CT scan.  Obesity - BMI 35 PCM -cont Ensure  Dispo -  hopefully patient can go home tomorrow, if ok with medicine.  Would like for her pain to be controlled a little better and cont to tolerate solid diet.  LOS: 17 days    Fenix Ruppe E 08/23/2014, 10:15 AM Pager: [phone]

## 2014-08-23 NOTE — Care Management Note (Addendum)
  Page 1 of 1   08/23/2014     9:57:14 AM CARE MANAGEMENT NOTE 08/23/2014  Patient:  [name] Blanchard Blanchard   Account Number:  [id number]  Date Initiated:  08/14/2014  Documentation initiated by:  Junius CreamerWELL,DEBBIE  Subjective/Objective Assessment:   adm w pancreatitis     Action/Plan:   lives w fam   Anticipated DC Date:  08/23/2014   Anticipated DC Plan:  HOME/SELF CARE         Choice offered to / List presented to:             Status of service:  Completed, signed off Medicare Important Message given?   (If response is "NO", the following Medicare IM given date fields will be blank) Date Medicare IM given:   Medicare IM given by:   Date Additional Medicare IM given:   Additional Medicare IM given by:    Discharge Disposition:  HOME/SELF CARE  Per UR Regulation:  Reviewed for med. necessity/level of care/duration of stay  If discussed at Long Length of Stay Meetings, dates discussed:   08/15/2014  08/22/2014    Comments:  08-23-14 PT recommending outpatient PT . Referral form on shadow chart . MD if you agree please sign referral form and call NCM . Thanks Ronny FlurryHeather Hailly Fess RN BSN [phone]     08-23-14 Consult for new Xarelto .  Xarelto  free 30 day trail offer card and $0 co pay every month card given to patient . Ronny FlurryHeather Deddrick Saindon RN BSN

## 2014-08-23 NOTE — Discharge Instructions (Addendum)
Information on my medicine - XARELTO (rivaroxaban)  This medication education was reviewed with me or my healthcare representative as part of my discharge preparation.  The pharmacist that spoke with me during my hospital stay was:  Lavonia Danamend, Caron George, [facility]  WHY WAS [name] HurlXARELTO PRESCRIBED FOR YOU? Xarelto was prescribed to treat blood clots that may have been found in the veins of your legs (deep vein thrombosis) or in your lungs (pulmonary embolism) and to reduce the risk of them occurring again.  What do you need to know about Xarelto? The starting dose is one 15 mg tablet taken TWICE daily with food for the FIRST 21 DAYS then on (enter date)  May 18  the dose is changed to one 20 mg tablet taken ONCE A DAY with your evening meal.  DO NOT stop taking Xarelto without talking to the health care provider who prescribed the medication.  Refill your prescription for 20 mg tablets before you run out.  After discharge, you should have regular check-up appointments with your healthcare provider that is prescribing your Xarelto.  In the future your dose may need to be changed if your kidney function changes by a significant amount.  What do you do if you miss a dose? If you are taking Xarelto TWICE DAILY and you miss a dose, take it as soon as you remember. You may take two 15 mg tablets (total 30 mg) at the same time then resume your regularly scheduled 15 mg twice daily the next day.  If you are taking Xarelto ONCE DAILY and you miss a dose, take it as soon as you remember on the same day then continue your regularly scheduled once daily regimen the next day. Do not take two doses of Xarelto at the same time.   Important Safety Information Xarelto is a blood thinner medicine that can cause bleeding. You should call your healthcare provider right away if you experience any of the following: ? Bleeding from an injury or your nose that does not stop. ? Unusual colored urine (red or dark brown) or  unusual colored stools (red or black). ? Unusual bruising for unknown reasons. ? A serious fall or if you hit your head (even if there is no bleeding).  Some medicines may interact with Xarelto and might increase your risk of bleeding while on Xarelto. To help avoid this, consult your healthcare provider or pharmacist prior to using any new prescription or non-prescription medications, including herbals, vitamins, non-steroidal anti-inflammatory drugs (NSAIDs) and supplements.  This website has more information on Xarelto: [email or web address].xarelto.[email or web address].  Low-Fat Diet for Pancreatitis or Gallbladder Conditions A low-fat diet can be helpful if you have pancreatitis or a gallbladder condition. With these conditions, your pancreas and gallbladder have trouble digesting fats. A healthy eating plan with less fat will help rest your pancreas and gallbladder and reduce your symptoms. WHAT DO I NEED TO KNOW ABOUT THIS DIET?  Eat a low-fat diet.  Reduce your fat intake to less than 20-30% of your total daily calories. This is less than 50-60 g of fat per day.  Remember that you need some fat in your diet. Ask your dietician what your daily goal should be.  Choose nonfat and low-fat healthy foods. Look for the words "nonfat," "low fat," or "fat free."  As a guide, look on the label and choose foods with less than 3 g of fat per serving. Eat only one serving.  Avoid alcohol.  Do not smoke. If you need help quitting,  talk with your health care provider.  Eat small frequent meals instead of three large heavy meals. WHAT FOODS CAN I EAT? Grains Include healthy grains and starches such as potatoes, wheat bread, fiber-rich cereal, and brown rice. Choose whole grain options whenever possible. In adults, whole grains should account for 45-65% of your daily calories.  Fruits and Vegetables Eat plenty of fruits and vegetables. Fresh fruits and vegetables add fiber to your diet. Meats and Other Protein Sources Eat  lean meat such as chicken and pork. Trim any fat off of meat before cooking it. Eggs, fish, and beans are other sources of protein. In adults, these foods should account for 10-35% of your daily calories. Dairy Choose low-fat milk and dairy options. Dairy includes fat and protein, as well as calcium.  Fats and Oils Limit high-fat foods such as fried foods, sweets, baked goods, sugary drinks.  Other Creamy sauces and condiments, such as mayonnaise, can add extra fat. Think about whether or not you need to use them, or use smaller amounts or low fat options. WHAT FOODS ARE NOT RECOMMENDED?  High fat foods, such as:  [employer].  Ice cream.  [place] toast.  Sweet rolls.  Pizza.  Cheese bread.  Foods covered with batter, butter, creamy sauces, or cheese.  Fried foods.  Sugary drinks and desserts.  Foods that cause gas or bloating Document Released: 04/19/2013 Document Reviewed: 04/19/2013 [facility] Patient Information 2015 [place], [place]. This information is not intended to replace advice given to you by your health care provider. Make sure you discuss any questions you have with your health care provider.  [facility] will call with appointment [phone]

## 2014-08-24 LAB — GLUCOSE, CAPILLARY
GLUCOSE-CAPILLARY: 107 mg/dL — AB (ref 70–99)
Glucose-Capillary: 87 mg/dL (ref 70–99)

## 2014-08-24 MED ORDER — BOOST / RESOURCE BREEZE PO LIQD: 1.0000 | Freq: Two times a day (BID) | ORAL | Status: DC

## 2014-08-24 MED ORDER — RIVAROXABAN 15 MG PO TABS: 15.0000 mg | ORAL_TABLET | Freq: Two times a day (BID) | ORAL | Status: DC

## 2014-08-24 MED ORDER — POLYETHYLENE GLYCOL 3350 17 G PO PACK: 17.0000 g | PACK | Freq: Every day | ORAL | Status: DC | PRN

## 2014-08-24 MED ORDER — PROMETHAZINE HCL 12.5 MG PO TABS: 12.5000 mg | ORAL_TABLET | Freq: Four times a day (QID) | ORAL | Status: DC | PRN

## 2014-08-24 MED ORDER — RIVAROXABAN 20 MG PO TABS: 20.0000 mg | ORAL_TABLET | Freq: Every day | ORAL | Status: DC

## 2014-08-24 MED ORDER — OXYCODONE HCL 5 MG PO TABS: 5.0000 mg | ORAL_TABLET | ORAL | Status: DC | PRN

## 2014-08-24 NOTE — Progress Notes (Signed)
PICC line removed by IV nurse. No signs of bleeding or complications present at site. Patient ready for discharge.

## 2014-08-24 NOTE — Progress Notes (Signed)
Discussed discharge summary with patient. Reviewed all medications with patient. Patient received Rx. Patient ready for discharge. 

## 2014-08-24 NOTE — Discharge Summary (Signed)
Physician Discharge Summary  [name] Blanchard GQB:[id number] DOB: [birth year] DOA: 08/06/2014  PCP: No PCP Per Patient  Admit date: 08/06/2014 Discharge date: 08/24/2014  Time spent: 30 minutes  Recommendations for Outpatient Follow-up:  1. Use xarelto for 3 months.  2. Follow up with PCP in one week.   Discharge Diagnoses:  Principal Problem:   Pancreatitis, acute Active Problems:   Abdominal pain   Gallstones   Common bile duct dilatation   Leukocytosis   Acute pancreatitis   Acute gallstone pancreatitis   Thrombus   Other constipation   SIRS (systemic inflammatory response syndrome)   Hypokalemia   Hypophosphatemia   Constipated   Acute necrotizing pancreatitis   Discharge Condition: improved  Diet recommendation: low fat diet.   Filed Weights   08/21/14 0500 08/22/14 0534 08/23/14 0527  Weight: 80.967 kg (178 lb 8 oz) 81.012 kg (178 lb 9.6 oz) 81.012 kg (178 lb 9.6 oz)    History of present illness:  24 y/o female with with chronic intermittent abdominal pain for 6 months admitted with acute pancreatitis. Patient was transferred from [facility] ER where lipase was reportedly over 37,000, w/ mildly elevated lfts, nml alk phos and tbili; and a CT showing a stone in the gallbladder w/ GB wall thickening + edema around the pancreas w/ cbd dilation but with no stone.  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 Patient was placed on anticoagulation after discussion with hematology. Patient has improved clinically. Patient off antibiotics. Patient's diet has been advanced to a soft diet. Patient's pain is being managed. Patient has also been transitioned to NOAC. Improving daily.   Hospital Course:   Acute severe gallstone pancreatitis/nectrozing pancreatitis. 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 Patient reports pain is well controlled and she is able to tolerate diet. -TNA has been discontinued. Antibiotics have been discontinued. WBC iis normal. Repeat CT of the abdomen and pelvis with pseudocyst formation, resolution of superior mesenteric vein thrombosis, no imaging features to suggest superinfection of pancreatic pseudocyst or phlegmon. - currently off PCA pump. Pain control as needed.   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 Thereasa Solo discussed w/ Hematology who agree that latest studies have concluded that short term treatment results in the best outcome for the pt by significantly lowering risk of progression of thrombus to complete occlusion as well as further propagation resulting in bowel ischemia or organ damage - will plan for a 3 month course as the cause is clearly identifiable/reversible (pancreatitis) - this tx could be interrupted for GB removal w/o need for lovenox overlap as a NOAC will be the tx of choice as oral intake becomes more reliable. Repeat CT of the abdomen and pelvis shows resolution of thrombus. Will discontinue full dose Lovenox and start patient on xarelto with goals of treatment for 3 months. Discussed with hematology.  Constipation Secondary to PCA - resolved at present. Continue bowel regimen of MiraLAX.  SIRS in the setting of acute pancreatitis SIRS physiology persisted early on in the hospitalization with with persistent sinus tachycardia and markedly elevated white blood cell count but pt is hemodynamically stable - fever curve is slowly trending back down. - Fever Likely  related to noninfectious inflammation, which has trended down. Heart rate has improved. Antibiotics have been discontinued. Repeat CT abdomen  and pelvis with evolving pancreatitis with some pseudocyst formation. Resolution of superior mesenteric vein thrombosis. No imaging features to suggest superinfection of pancreatic pseudocyst or phlegmon. Diet has been advanced and she denies any new complaints.   Hypokalemia Corrected with TNA - follow.  Hypophosphatemia Corrected with TNA - follow.  Nutrition TNA has been discontinued. Patient currently tolerating regular diet which has been advanced to a soft diet per general surgery.   Procedures:  General surgery.   Consultations:  surgery  Discharge Exam: Filed Vitals:   08/24/14 0558  BP: 108/59  Pulse: 77  Temp: 98.2 F (36.8 C)  Resp: 16    General: alert afebrile comfortable Cardiovascular: s1s2 Respiratory: ctab  Discharge Instructions   Discharge Instructions    Discharge instructions    Complete by:  As directed   Take low fat food, follow up with surgery as recommended.  Follow up with PCP in 1 to 2 weeks.          Current Discharge Medication List    START taking these medications   Details  feeding supplement, RESOURCE BREEZE, (RESOURCE BREEZE) LIQD Take 1 Container by mouth 2 (two) times daily between meals. Refills: 0    polyethylene glycol (MIRALAX / GLYCOLAX) packet Take 17 g by mouth daily as needed. Qty: 14 each, Refills: 0    promethazine (PHENERGAN) 12.5 MG tablet Take 1-2 tablets (12.5-25 mg total) by mouth every 6 (six) hours as needed for nausea, vomiting or refractory nausea / vomiting. Qty: 20 tablet, Refills: 0    !! Rivaroxaban (XARELTO) 15 MG TABS tablet Take 1 tablet (15 mg total) by mouth 2 (two) times daily with a meal. Qty: 40 tablet, Refills: 0    !! rivaroxaban (XARELTO) 20 MG TABS tablet Take 1 tablet (20 mg total) by mouth daily with supper. Qty: 30 tablet, Refills: 1     !! - Potential duplicate medications found. Please discuss with provider.    CONTINUE these medications which have NOT CHANGED   Details   citalopram (CELEXA) 20 MG tablet Take 20 mg by mouth daily.    Cyanocobalamin (VITAMIN B-12 IJ) Inject 1 application into the skin every Monday, Wednesday, and Friday.    phentermine (ADIPEX-P) 37.5 MG tablet Take 37.5 mg by mouth daily.    !! PRESCRIPTION MEDICATION Inject 1 application into the skin every Monday, Wednesday, and Friday. HCG Injection    !! PRESCRIPTION MEDICATION Inject 1 application into the skin every Monday, Wednesday, and Friday. Lipotropic injection    rizatriptan (MAXALT) 10 MG tablet Take 10 mg by mouth daily as needed for migraine.      !! - Potential duplicate medications found. Please discuss with provider.    STOP taking these medications     ibuprofen (ADVIL,MOTRIN) 200 MG tablet        No Known Allergies Follow-up Information    Follow up with CORNETT,THOMAS A., MD. Schedule an appointment as soon as possible for a visit in 2 weeks.   Specialty:  General Surgery   Contact information:   [address] [place] [address] [phone]       Follow up with PCP In 1 week.       The results of significant diagnostics from this hospitalization (including imaging, microbiology, ancillary and laboratory) are listed below for reference.    Significant Diagnostic Studies: Dg Abd 1  View  08/08/2014   CLINICAL DATA:  Abdominal pain, vomiting and constipation for 2 days. Acute pancreatitis.  EXAM: ABDOMEN - 1 VIEW  COMPARISON:  CT abdomen and pelvis and chest and two views abdomen.  FINDINGS: The bowel gas pattern is normal. No radio-opaque calculi or other significant radiographic abnormality are seen.  IMPRESSION: Negative exam.   Electronically Signed   By: Inge Rise M.D.   On: 08/08/2014 13:14   Nm Hepatobiliary Liver Func  08/09/2014   CLINICAL DATA:  Gallstones.  Nausea vomiting and pain  EXAM: NUCLEAR MEDICINE HEPATOBILIARY IMAGING WITH GALLBLADDER EF  TECHNIQUE: Sequential images of the abdomen were obtained out to 60 minutes  following intravenous administration of radiopharmaceutical. After slow intravenous infusion of 1.82 micrograms Cholecystokinin, gallbladder ejection fraction was determined.  RADIOPHARMACEUTICALS:  [employer] FV-49S Choletec  COMPARISON:  CT abdomen 08/06/2014  FINDINGS: Normal uptake by the gallbladder. Normal gallbladder ejection. Small bowel activity noted. 73% ejection fraction at 41 minutes. At 45 min, normal ejection fraction is greater than 40%.  IMPRESSION: Normal gallbladder uptake.  Normal gallbladder ejection fraction.   Electronically Signed   By: Franchot Gallo M.D.   On: 08/09/2014 11:52   Ct Abdomen Pelvis W Contrast  08/20/2014   CLINICAL DATA:  Necrotizing pancreatitis. Superior mesenteric vein clot. No anti coagulation. Reassess clot and pseudocyst. Evolving pancreatitis. Persistent leukocytosis.  EXAM: CT ABDOMEN AND PELVIS WITH CONTRAST  TECHNIQUE: Multidetector CT imaging of the abdomen and pelvis was performed using the standard protocol following bolus administration of intravenous contrast.  CONTRAST:  178m OMNIPAQUE IOHEXOL 300 MG/ML  SOLN  COMPARISON:  None.  FINDINGS: Evolving pancreatitis is present. Compared to the prior exam 08/14/2014, there is better definition of the pseudocyst along the anterior and inferior aspect of the pancreas. There is almost no enhancing pancreatic neck tissue which could produce bridging pancreatitis and attention on follow-up is recommended. Fluid in the pericolic gutters is become more nodular, probably representing Organization.  There is no convincing evidence of abscess or superinfection of pancreatic pseudocysts. The largest developing pseudocyst is now developing a wall and resides in the lesser sac, measuring 10.6 cm transverse by 4.5 cm AP. Smaller components of this are present inferiorly. In aggregate, the craniocaudal dimension of the peripancreatic pseudocysts is 10.6 cm.  The lung bases show atelectasis. Fatty infiltration at the  falciform ligament of the liver. Punctate high-density areas present in the gallbladder compatible with a small gallstone. The gallbladder is partially contracted.  The splenic vein remains patent. There is mass effect on the portal system at the porta splenic confluence. There is no longer are any mesenteric thrombus identified.  No secondary findings of venous ischemia or infarct in the bowel.  Urinary bladder is distended. Small amount of free fluid in the anatomic pelvis. Atrophic appearance of the uterus.  IMPRESSION: 1. Evolving pancreatitis with better definition of pseudocysts with the largest in the lesser sac. 2. Resolution of superior mesenteric vein thrombosis. Portal venous system is patent on today's exam. No vascular complications of bowel. 3. No imaging features to suggest superinfection of pancreatic pseudocyst or phlegmon.   Electronically Signed   By: GDereck LigasM.D.   On: 08/20/2014 12:22   Ct Abdomen Pelvis W Contrast  08/14/2014   CLINICAL DATA:  Abdominal pain, nausea, history of gallstones and pancreatitis  EXAM: CT ABDOMEN AND PELVIS WITH CONTRAST  TECHNIQUE: Multidetector CT imaging of the abdomen and pelvis was performed using the standard protocol following bolus administration of  intravenous contrast. Sagittal and coronal MPR images reconstructed from axial data set.  CONTRAST:  77m OMNIPAQUE IOHEXOL 300 MG/ML  SOLN IV  COMPARISON:  08/06/2014  FINDINGS: Bibasilar pleural effusions and atelectasis greater on LEFT.  Enlarged edematous pancreas with significant fluid/edema along the pancreatic bed in adjacent to pancreatic tail compatible with acute pancreatitis, changes progressive since previous exam.  A portion of the pancreatic body now is low in attenuation and demonstrates absent enhancement, suspicious for pancreatic necrosis.  Diffuse edema in the anterior para renal spaces extending to the lateral conal fascia bilaterally and the ascending/descending colon with minimal  thickening of the wall of the descending colon.  Increased edema extending into small bowel mesentery.  Mild thickening of posterior wall the stomach with minimal fluid in the lesser sac.  Increased edema of the second and third portions of the duodenum.  Small amount of ascites.  Small segment of thrombus within the superior mesenteric vein, extending to near the portal vein confluence though the portal and splenic veins remain patent.  Dependent gallstone in gallbladder.  Remaining bowel loops unremarkable.  Normal appearing bladder, ureters, uterus and LEFT adnexa.  Small RIGHT ovarian cyst 17 mm diameter.  Edema adjacent to the appendix appears related to diffuse abdominal process noted above.  No mass, adenopathy, or free air.  Osseous structures unremarkable.  IMPRESSION: Progressive changes of acute pancreatitis with a segment of the pancreatic body now demonstrating absent enhancement question pancreatic necrosis.  Slightly increased peripancreatic inflammatory changes with persistent fluid at the pancreatic bed and adjacent to the pancreatic tail though these collections do not yet appear to be well-marginated pseudocysts at this time.  Small segment of thrombus within the superior mesenteric vein with portal and splenic veins appearing patent.  Bibasilar effusions atelectasis with small amount of free intraperitoneal fluid.  Findings called to KSaverio DankerPA on 08/14/2014 at 1435 hours.   Electronically Signed   By: MLavonia DanaM.D.   On: 08/14/2014 14:37   Dg Chest Port 1 View  08/10/2014   CLINICAL DATA:  Short of breath.  EXAM: PORTABLE CHEST - 1 VIEW  COMPARISON:  Abdomen CT, 08/06/2014  FINDINGS: There is new lung base opacity obscuring the hemidiaphragms and most of the cardiac silhouette. This is likely combination of pleural effusions and atelectasis. Pneumonia is possible. There is no evidence of pulmonary edema.  No pneumothorax.  IMPRESSION: 1. New bilateral lung base opacity most likely  combination of pleural effusions and atelectasis. Consider pneumonia/pneumonitis in the proper clinical setting. No pulmonary edema.   Electronically Signed   By: DLajean ManesM.D.   On: 08/10/2014 07:50    Microbiology: No results found for this or any previous visit (from the past 240 hour(s)).   Labs: Basic Metabolic Panel:  Recent Labs Lab 08/18/14 0530 08/19/14 0530 08/20/14 0450 08/21/14 1030 08/22/14 0517 08/23/14 0510  NA 132* 134* 134* 133* 137 136  K 3.7 3.7 4.3 4.2 4.4 4.1  CL 95* 95* 97 98 100 101  CO2 26 27 25 26 26 27   GLUCOSE 133* 124* 110* 137* 100* 100*  BUN 7 6 <5* <5* <5* <5*  CREATININE 0.74 0.71 0.75 0.69 0.70 0.76  CALCIUM 8.3* 8.7 8.8 8.8 9.0 8.7  MG 1.9  --   --   --  1.8  --   PHOS  --   --   --   --  5.1*  --    Liver Function Tests:  Recent Labs Lab 08/18/14  0530 08/21/14 1030 08/22/14 0517 08/23/14 0510  AST 26 21 24 19   ALT 21 18 19 16   ALKPHOS 61 49 46 41  BILITOT 0.8 0.6 0.6 0.4  PROT 6.3 6.7 6.7 6.4  ALBUMIN 2.1* 2.4* 2.4* 2.3*   No results for input(s): LIPASE, AMYLASE in the last 168 hours. No results for input(s): AMMONIA in the last 168 hours. CBC:  Recent Labs Lab 08/18/14 0530 08/19/14 0530 08/20/14 0450 08/21/14 1030 08/22/14 0517 08/23/14 0510  WBC 22.1* 19.5* 22.1* 16.1* 11.9* 10.5  NEUTROABS 18.1*  --   --  12.7*  --   --   HGB 10.2* 10.0* 10.6* 9.8* 10.1* 9.4*  HCT 30.8* 30.4* 32.3* 30.2* 30.9* 29.5*  MCV 88.5 89.7 90.2 89.9 90.1 90.5  PLT 341 403* 498* 493* 495* 465*   Cardiac Enzymes: No results for input(s): CKTOTAL, CKMB, CKMBINDEX, TROPONINI in the last 168 hours. BNP: BNP (last 3 results) No results for input(s): BNP in the last 8760 hours.  ProBNP (last 3 results) No results for input(s): PROBNP in the last 8760 hours.  CBG:  Recent Labs Lab 08/23/14 0743 08/23/14 1147 08/23/14 1653 08/23/14 2123 08/24/14 0719  GLUCAP 101* 113* 98 86 87       Signed:  Lauryl Seyer  [facility] 08/24/2014, 9:38 AM

## 2014-08-24 NOTE — Progress Notes (Signed)
Patient ID: [name] Blanchard, female   DOB: Aug 11, 1990, 24 y.o.   MRN: [id number]    Subjective: Pt feels well today.  Change in pain meds yesterday worked much better for her.  Still tolerating a solid diet with no change in pain  Objective: Vital signs in last 24 hours: Temp:  [97.9 F (36.6 C)-98.3 F (36.8 C)] 98.2 F (36.8 C) (04/28 0558) Pulse Rate:  [77-88] 77 (04/28 0558) Resp:  [16-19] 16 (04/28 0558) BP: (104-111)/(59-72) 108/59 mmHg (04/28 0558) SpO2:  [96 %-97 %] 96 % (04/28 0558) Last BM Date: 08/23/14  Intake/Output from previous day: 04/27 0701 - 04/28 0700 In: 480 [P.O.:480] Out: -  Intake/Output this shift:    PE: Abd: soft, still full in upper abdomen, but less tender, +BS  Lab Results:   Recent Labs  08/22/14 0517 08/23/14 0510  WBC 11.9* 10.5  HGB 10.1* 9.4*  HCT 30.9* 29.5*  PLT 495* 465*   BMET  Recent Labs  08/22/14 0517 08/23/14 0510  NA 137 136  K 4.4 4.1  CL 100 101  CO2 26 27  GLUCOSE 100* 100*  BUN <5* <5*  CREATININE 0.70 0.76  CALCIUM 9.0 8.7   PT/INR No results for input(s): LABPROT, INR in the last 72 hours. CMP     Component Value Date/Time   NA 136 08/23/2014 0510   K 4.1 08/23/2014 0510   CL 101 08/23/2014 0510   CO2 27 08/23/2014 0510   GLUCOSE 100* 08/23/2014 0510   BUN <5* 08/23/2014 0510   CREATININE 0.76 08/23/2014 0510   CALCIUM 8.7 08/23/2014 0510   PROT 6.4 08/23/2014 0510   ALBUMIN 2.3* 08/23/2014 0510   AST 19 08/23/2014 0510   ALT 16 08/23/2014 0510   ALKPHOS 41 08/23/2014 0510   BILITOT 0.4 08/23/2014 0510   GFRNONAA >90 08/23/2014 0510   GFRAA >90 08/23/2014 0510   Lipase     Component Value Date/Time   LIPASE 40 08/13/2014 0430       Studies/Results: No results found.  Anti-infectives: Anti-infectives    Start     Dose/Rate Route Frequency Ordered Stop   08/15/14 0900  imipenem-cilastatin (PRIMAXIN) 500 mg in sodium chloride 0.9 % 100 mL IVPB  Status:  Discontinued     500 mg 200  mL/hr over 30 Minutes Intravenous 4 times per day 08/15/14 0821 08/16/14 1158   08/07/14 0400  piperacillin-tazobactam (ZOSYN) IVPB 3.375 g  Status:  Discontinued     3.375 g 12.5 mL/hr over 240 Minutes Intravenous Every 8 hours 08/06/14 1910 08/15/14 0816   08/06/14 1930  piperacillin-tazobactam (ZOSYN) IVPB 3.375 g     3.375 g 12.5 mL/hr over 240 Minutes Intravenous  Once 08/06/14 1909 08/06/14 2340       Assessment/Plan   HD #18 Severe necrotizing gallstone pancreatitis with pseudocyst -CT shows new pseudocyst of 10.6cm. There is no evidence of infection or abscess. This is felt to likely be simple fluid. No further course of action needs to be taken for this right now as this will likely resolve on its own. If it develops into an abscess or infected, then she will likely need it drained -Tolerating low fat det -oxycodone 5-15mg  worked much better for patient Leukocytosis -WBC normal Small segment thrombus within the superior mesenteric vein  -this has resolved on this CT scan.  Obesity - BMI 35 PCM -cont Ensure  Dispo -home today.  Follow up with Dr. Luisa Hartornett in 2 weeks   LOS: 18 days  Mysha Peeler E 08/24/2014, 9:58 AM Pager: [phone]

## 2014-08-24 NOTE — Care Management (Signed)
[phone] , referral faxed , they will call patient with appointment . Ronny FlurryHeather Marcella Dunnaway RN BSN [phone]

## 2014-09-08 ENCOUNTER — Other Ambulatory Visit: Payer: Self-pay | Admitting: Surgery

## 2014-09-08 ENCOUNTER — Ambulatory Visit: Payer: Self-pay | Admitting: Surgery

## 2014-09-08 DIAGNOSIS — K851 Biliary acute pancreatitis without necrosis or infection: Secondary | ICD-10-CM

## 2014-09-09 ENCOUNTER — Emergency Department (HOSPITAL_COMMUNITY): Payer: Managed Care, Other (non HMO)

## 2014-09-09 ENCOUNTER — Emergency Department (HOSPITAL_COMMUNITY)
Admission: EM | Admit: 2014-09-09 | Discharge: 2014-09-10 | Disposition: A | Discharge status: Home / Self Care | Payer: Managed Care, Other (non HMO) | Attending: Emergency Medicine | Admitting: Emergency Medicine

## 2014-09-09 ENCOUNTER — Encounter (HOSPITAL_COMMUNITY): Payer: Self-pay | Admitting: *Deleted

## 2014-09-09 DIAGNOSIS — Z7901 Long term (current) use of anticoagulants: Secondary | ICD-10-CM | POA: Diagnosis not present

## 2014-09-09 DIAGNOSIS — Z8742 Personal history of other diseases of the female genital tract: Secondary | ICD-10-CM | POA: Insufficient documentation

## 2014-09-09 DIAGNOSIS — R1013 Epigastric pain: Secondary | ICD-10-CM | POA: Diagnosis present

## 2014-09-09 DIAGNOSIS — K863 Pseudocyst of pancreas: Secondary | ICD-10-CM | POA: Diagnosis not present

## 2014-09-09 DIAGNOSIS — Z3202 Encounter for pregnancy test, result negative: Secondary | ICD-10-CM | POA: Diagnosis not present

## 2014-09-09 DIAGNOSIS — F419 Anxiety disorder, unspecified: Secondary | ICD-10-CM | POA: Insufficient documentation

## 2014-09-09 DIAGNOSIS — Z79899 Other long term (current) drug therapy: Secondary | ICD-10-CM | POA: Diagnosis not present

## 2014-09-09 LAB — COMPREHENSIVE METABOLIC PANEL
ALK PHOS: 206 U/L — AB (ref 38–126)
ALT: 264 U/L — ABNORMAL HIGH (ref 14–54)
AST: 238 U/L — ABNORMAL HIGH (ref 15–41)
Albumin: 4 g/dL (ref 3.5–5.0)
Anion gap: 12 (ref 5–15)
BUN: 6 mg/dL (ref 6–20)
CHLORIDE: 100 mmol/L — AB (ref 101–111)
CO2: 23 mmol/L (ref 22–32)
Calcium: 9.6 mg/dL (ref 8.9–10.3)
Creatinine, Ser: 0.91 mg/dL (ref 0.44–1.00)
GFR calc non Af Amer: >60 mL/min (ref >60)
GLUCOSE: 106 mg/dL — AB (ref 65–99)
Potassium: 4.1 mmol/L (ref 3.5–5.1)
Sodium: 135 mmol/L (ref 135–145)
Total Bilirubin: 0.9 mg/dL (ref 0.3–1.2)
Total Protein: 8.9 g/dL — ABNORMAL HIGH (ref 6.5–8.1)

## 2014-09-09 LAB — LIPASE, BLOOD: Lipase: 62 U/L — ABNORMAL HIGH (ref 22–51)

## 2014-09-09 LAB — URINALYSIS, ROUTINE W REFLEX MICROSCOPIC
Bilirubin Urine: NEGATIVE
GLUCOSE, UA: NEGATIVE mg/dL
HGB URINE DIPSTICK: NEGATIVE
Ketones, ur: 40 mg/dL — AB
Leukocytes, UA: NEGATIVE
Nitrite: NEGATIVE
PROTEIN: NEGATIVE mg/dL
Specific Gravity, Urine: 1.018 (ref 1.005–1.030)
Urobilinogen, UA: 0.2 mg/dL (ref 0.0–1.0)
pH: 5 (ref 5.0–8.0)

## 2014-09-09 LAB — CBC WITH DIFFERENTIAL/PLATELET
BASOS ABS: 0 10*3/uL (ref 0.0–0.1)
Basophils Relative: 1 % (ref 0–1)
Eosinophils Absolute: 0.2 10*3/uL (ref 0.0–0.7)
Eosinophils Relative: 3 % (ref 0–5)
HCT: 39.7 % (ref 36.0–46.0)
Hemoglobin: 12.9 g/dL (ref 12.0–15.0)
Lymphocytes Relative: 17 % (ref 12–46)
Lymphs Abs: 1.5 10*3/uL (ref 0.7–4.0)
MCH: 29 pg (ref 26.0–34.0)
MCHC: 32.5 g/dL (ref 30.0–36.0)
MCV: 89.2 fL (ref 78.0–100.0)
Monocytes Absolute: 0.7 10*3/uL (ref 0.1–1.0)
Monocytes Relative: 8 % (ref 3–12)
Neutro Abs: 6.4 10*3/uL (ref 1.7–7.7)
Neutrophils Relative %: 71 % (ref 43–77)
PLATELETS: 280 10*3/uL (ref 150–400)
RBC: 4.45 MIL/uL (ref 3.87–5.11)
RDW: 13.1 % (ref 11.5–15.5)
WBC: 8.9 10*3/uL (ref 4.0–10.5)

## 2014-09-09 LAB — I-STAT CG4 LACTIC ACID, ED
LACTIC ACID, VENOUS: 0.65 mmol/L (ref 0.5–2.0)
Lactic Acid, Venous: 1.54 mmol/L (ref 0.5–2.0)

## 2014-09-09 LAB — POC URINE PREG, ED: Preg Test, Ur: NEGATIVE

## 2014-09-09 MED ORDER — HYDROMORPHONE HCL 1 MG/ML IJ SOLN
1.0000 mg | Freq: Once | INTRAMUSCULAR | Status: AC
  Administered 2014-09-09: 1 mg via INTRAVENOUS
  Filled 2014-09-09: qty 1

## 2014-09-09 MED ORDER — IOHEXOL 300 MG/ML  SOLN: 25.0000 mL | Freq: Once | INTRAMUSCULAR | Status: AC | PRN

## 2014-09-09 MED ORDER — ONDANSETRON HCL 4 MG/2ML IJ SOLN
4.0000 mg | Freq: Once | INTRAMUSCULAR | Status: AC
  Administered 2014-09-09: 4 mg via INTRAVENOUS
  Filled 2014-09-09: qty 2

## 2014-09-09 MED ORDER — PROMETHAZINE HCL 25 MG/ML IJ SOLN
12.5000 mg | Freq: Four times a day (QID) | INTRAMUSCULAR | Status: DC | PRN
  Administered 2014-09-09: 12.5 mg via INTRAVENOUS
  Filled 2014-09-09: qty 1

## 2014-09-09 MED ORDER — PROMETHAZINE HCL 25 MG/ML IJ SOLN
25.0000 mg | Freq: Once | INTRAMUSCULAR | Status: AC
  Administered 2014-09-09: 25 mg via INTRAVENOUS
  Filled 2014-09-09: qty 1

## 2014-09-09 MED ORDER — SODIUM CHLORIDE 0.9 % IV BOLUS (SEPSIS)
1000.0000 mL | Freq: Once | INTRAVENOUS | Status: AC
  Administered 2014-09-09: 1000 mL via INTRAVENOUS

## 2014-09-09 MED ORDER — IOHEXOL 300 MG/ML  SOLN
100.0000 mL | Freq: Once | INTRAMUSCULAR | Status: AC | PRN
  Administered 2014-09-09: 100 mL via INTRAVENOUS

## 2014-09-09 MED ORDER — HYDROMORPHONE HCL 1 MG/ML IJ SOLN
1.0000 mg | Freq: Once | INTRAMUSCULAR | Status: AC
Start: 2014-09-09 — End: 2014-09-09
  Administered 2014-09-09: 1 mg via INTRAVENOUS
  Filled 2014-09-09: qty 1

## 2014-09-09 MED ORDER — OXYCODONE HCL 5 MG PO TABS: 5.0000 mg | ORAL_TABLET | ORAL | Status: DC | PRN

## 2014-09-09 MED ORDER — PROMETHAZINE HCL 12.5 MG PO TABS: 12.5000 mg | ORAL_TABLET | Freq: Four times a day (QID) | ORAL | Status: DC | PRN

## 2014-09-09 NOTE — ED Notes (Signed)
Dr. Donell BeersByerly at [facility].

## 2014-09-09 NOTE — ED Provider Notes (Signed)
CSN: [id number]     Arrival date & time 09/09/14  1421 History   First MD Initiated Contact with Patient 09/09/14 1541     Chief Complaint  Patient presents with  . Abdominal Pain   [name] Blanchard is a 24 y.o. 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 The patient reports that she was admitted for 18 days last month for her pancreatitis and has been controlling her pain and nausea relatively well at home. The patient reports that she saw her surgeon yesterday who scheduled her to have a CT scan. The patient says this morning, she had acute worsening of her nausea with no emesis as well as her abdominal pain. The patient says that her abdominal pain has been at a baseline of about 3 and well-controlled until today  Where it has been between 8-10/10 in severity and located in her epigastrium and left upper quadrant primarily. The pain is nonradiating. The patient reports pain with laying flat and movement. The patient also reports that bumps on the highway on the way to the hospital exacerbate her pain. The patient denies any fevers, chills, chest, short of breath, because patient, diarrhea, dysuria. Patient denies any vaginal discharge or vaginal bleeding. The patient says her last period was during her recent admission. The patient reports that any palpation to her abdomen is exquisitely painful. The patient reports that she tolerated Dilaudid but did not tolerate morphine at her last visit. The patient denies any allergies to her knowledge.   (Consider location/radiation/quality/duration/timing/severity/associated sxs/prior Treatment) Patient is a 24 y.o. female presenting with abdominal pain. The history is provided by the patient, a significant other, a relative and medical  records. No language interpreter was used.  Abdominal Pain Pain location:  Epigastric and LUQ Pain quality: aching   Pain radiates to:  Does not radiate Pain severity:  Severe Onset quality:  Gradual Duration:  1 day Timing:  Constant Progression:  Waxing and waning Chronicity:  Recurrent Context: not previous surgeries and not trauma   Relieved by:  Not moving Worsened by:  Palpation, position changes and movement (laying flat) Ineffective treatments:  None tried Associated symptoms: nausea   Associated symptoms: no anorexia, no chest pain, no chills, no constipation, no cough, no diarrhea, no dysuria, no fever, no shortness of breath, no vaginal bleeding, no vaginal discharge and no vomiting   Risk factors: recent hospitalization   Risk factors: not obese     Past Medical History  Diagnosis Date  . Anxiety     diagnosed in 2012  . Contraceptive use     Had nexplanon - removed, now on OCP  . Dysmenorrhea    Past Surgical History  Procedure Laterality Date  . Knee surgery Right 09/10/2010    Dr. Luiz BlareGraves  . Tonsillectomy      24 y/o  . Adenoidectomy      24y/o  . Wisdom tooth extraction      unsure if 2 or 4 teeth   History reviewed. No pertinent family history. History  Substance Use Topics  . Smoking status: Never Smoker   . Smokeless tobacco: Not on file  . Alcohol Use: 0.0 oz/week    0 Standard drinks or equivalent per week     Comment: Occasionally few mixed drinks with friends   OB History    No  data available     Review of Systems  Constitutional: Negative for fever, chills and appetite change.  HENT: Negative for congestion and rhinorrhea.   Eyes: Negative for visual disturbance.  Respiratory: Negative for cough, chest tightness, shortness of breath, wheezing and stridor.   Cardiovascular: Negative for chest pain, palpitations and leg swelling.  Gastrointestinal: Positive for nausea and abdominal pain. Negative for vomiting, diarrhea, constipation,  abdominal distention and anorexia.  Genitourinary: Negative for dysuria, flank pain, vaginal bleeding and vaginal discharge.  Musculoskeletal: Negative for back pain, neck pain and neck stiffness.  Neurological: Negative for dizziness and headaches.  Psychiatric/Behavioral: Negative for agitation.  All other systems reviewed and are negative.     Allergies  Review of patient's allergies indicates no known allergies.  Home Medications   Prior to Admission medications   Medication Sig Start Date End Date Taking? Authorizing Provider  citalopram (CELEXA) 20 MG tablet Take 20 mg by mouth daily.    Historical Provider, MD  Cyanocobalamin (VITAMIN B-12 IJ) Inject 1 application into the skin every Monday, Wednesday, and Friday.    Historical Provider, MD  feeding supplement, RESOURCE BREEZE, (RESOURCE BREEZE) LIQD Take 1 Container by mouth 2 (two) times daily between meals. 08/24/14   Kathlen ModyVijaya Akula, MD  oxyCODONE (OXY IR/ROXICODONE) 5 MG immediate release tablet Take 1-3 tablets (5-15 mg total) by mouth every 4 (four) hours as needed for moderate pain or severe pain. 08/24/14   Barnetta ChapelKelly Osborne, PA-C  phentermine (ADIPEX-P) 37.5 MG tablet Take 37.5 mg by mouth daily.    Historical Provider, MD  polyethylene glycol (MIRALAX / GLYCOLAX) packet Take 17 g by mouth daily as needed. 08/24/14   Kathlen ModyVijaya Akula, MD  PRESCRIPTION MEDICATION Inject 1 application into the skin every Monday, Wednesday, and Friday. HCG Injection    Historical Provider, MD  PRESCRIPTION MEDICATION Inject 1 application into the skin every Monday, Wednesday, and Friday. Lipotropic injection    Historical Provider, MD  promethazine (PHENERGAN) 12.5 MG tablet Take 1-2 tablets (12.5-25 mg total) by mouth every 6 (six) hours as needed for nausea, vomiting or refractory nausea / vomiting. 08/24/14   Kathlen ModyVijaya Akula, MD  Rivaroxaban (XARELTO) 15 MG TABS tablet Take 1 tablet (15 mg total) by mouth 2 (two) times daily with a meal. 08/24/14 09/12/14   Kathlen ModyVijaya Akula, MD  rivaroxaban (XARELTO) 20 MG TABS tablet Take 1 tablet (20 mg total) by mouth daily with supper. 09/13/14   Kathlen ModyVijaya Akula, MD  rizatriptan (MAXALT) 10 MG tablet Take 10 mg by mouth daily as needed for migraine.  03/29/14 03/29/15  Historical Provider, MD   BP 128/86 mmHg  Pulse 120  Temp(Src) 97.7 F (36.5 C) (Oral)  Resp 20  Ht 5\' 1"  (1.549 m)  Wt 165 lb 11.2 oz (75.161 kg)  BMI 31.32 kg/m2  SpO2 100%  LMP 08/06/2014 Physical Exam  Constitutional: She is oriented to person, place, and time. She appears well-developed and well-nourished. No distress.  HENT:  Head: Normocephalic and atraumatic.  Mouth/Throat: No oropharyngeal exudate.  Eyes: Conjunctivae and EOM are normal. Pupils are equal, round, and reactive to light. No scleral icterus.  Neck: Normal range of motion.  Cardiovascular: Regular rhythm, normal heart sounds and intact distal pulses.   No murmur heard. Pulmonary/Chest: Effort normal and breath sounds normal. No stridor. No respiratory distress. She exhibits no tenderness.  Abdominal: Soft. Normal appearance and bowel sounds are normal. She exhibits no distension. There is tenderness in the epigastric area and left upper quadrant. There is  no rigidity, no rebound, no guarding, no CVA tenderness and negative Murphy's sign.    Musculoskeletal: She exhibits no edema.  Neurological: She is alert and oriented to person, place, and time. She displays normal reflexes.  Skin: Skin is warm. She is not diaphoretic. No pallor.  Psychiatric: She has a normal mood and affect.  Nursing note and vitals reviewed.   ED Course  Procedures (including critical care time) Labs Review Labs Reviewed  LIPASE, BLOOD - Abnormal; Notable for the following:    Lipase 62 (*)    All other components within normal limits  COMPREHENSIVE METABOLIC PANEL - Abnormal; Notable for the following:    Chloride 100 (*)    Glucose, Bld 106 (*)    Total Protein 8.9 (*)    AST 238 (*)     ALT 264 (*)    Alkaline Phosphatase 206 (*)    All other components within normal limits  URINALYSIS, ROUTINE W REFLEX MICROSCOPIC - Abnormal; Notable for the following:    Ketones, ur 40 (*)    All other components within normal limits  URINE CULTURE  CBC WITH DIFFERENTIAL/PLATELET  POC URINE PREG, ED  I-STAT CG4 LACTIC ACID, ED  I-STAT CG4 LACTIC ACID, ED    Imaging Review Ct Abdomen Pelvis W Contrast  09/09/2014   CLINICAL DATA:  24 year old female with upper abdominal pain and nausea today. History of pancreatitis.  EXAM: CT ABDOMEN AND PELVIS WITH CONTRAST  TECHNIQUE: Multidetector CT imaging of the abdomen and pelvis was performed using the standard protocol following bolus administration of intravenous contrast.  CONTRAST:  OMNIPAQUE IOHEXOL 300 MG/ML  SOLN  COMPARISON:  08/20/2014 and prior CTs dating back to 08/06/2014.  FINDINGS: Lower chest: Unremarkable except for minimal lingular and right middle lobe atelectasis/ scarring.  Hepatobiliary: The liver and gallbladder are unremarkable. There is no evidence of biliary dilatation.  Pancreas: A 4.5 x 9.5 cm pseudocyst along the pancreatic body is noted and a slightly more ill-defined 4 x 6 cm pseudocyst along the pancreatic tail. Nodularity along the peritoneal surfaces are likely related to recent inflammatory changes.  Spleen: Unremarkable  Adrenals/Urinary Tract: The kidneys, adrenal glands and bladder are unremarkable.  Stomach/Bowel: There is some mass effect on the stomach from the pancreatic body pseudocyst described above. There is no evidence of bowel obstruction. The appendix is normal.  Vascular/Lymphatic: Reactive lymph nodes within the mesenteric and retroperitoneum are noted. No evidence of aneurysm.  Reproductive: Uterus is unremarkable. A 3 cm right ovarian cyst is noted.  Other: No free fluid, or pneumoperitoneum.  Musculoskeletal: No acute or suspicious abnormalities.  IMPRESSION: 4.5 x 9.5 cm pancreatic body pseudocyst  and 4 x 6 cm slightly more ill-defined pseudocyst along the pancreatic tail. The pancreatic body pseudocyst has some mass effect on the stomach.  3 cm right ovarian cyst.   Electronically Signed   By: Harmon Pier M.D.   On: 09/09/2014 20:33     EKG Interpretation None      MDM   Final diagnoses:  Pseudocyst of pancreas   Aubrey Blackard is a 24 y.o. 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 Given the patient's report that her abdominal pain has moved more superiorly as well as her worsening nausea and vomiting, there is concern for worsening of her pancreatitis and pseudocyst. The patient  was given pain medication and nausea medicine as well as fluids during her initial workup. The patient's vital signs revealed tachycardia and tachypnea on arrival. The patient had no fevers in the emergency department. The patient had laboratory testing ordered which showed a normal CBC, a urinalysis that did not show UTI, a normal lactic acid and she was not pregnant. The patient's CMP did reveal abnormalities including an elevation in her AST ALT and alkaline phosphatase. The patient also had a lipase of 62 which is elevated but improved from prior.  Given the patient's known abdominal pathology, a CT of the abdomen and pelvis was obtained. The CT scan revealed pancreatic body and pancreatic head pseudocysts with mass effect on the stomach.  Following the patient's continued pain and nausea medicine requirements and the findings on her imaging and laboratory testing, the general surgery team was called for management recommendations.  After evaluation, the surgery team felt that the patient recently passed a gallstone and is likely having pain secondary to this. They recommended clear  diet as well as increasing her pain medication regimen. They've provided the patient with the necessary prescriptions for her symptomatically management and will likely recommend a laparoscopic cholecystectomy in the near future. The patient will follow-up with them as directed and understood return precautions for new or worsening symptoms.  The patient had other problems or consultations while remaining in the emergency department and was discharged in good condition.  This patient was seen with Dr. Rhunette Croft, ED attending.      Theda Belfast, MD 09/10/14 1610  Derwood Kaplan, MD 09/10/14 1540

## 2014-09-09 NOTE — ED Notes (Signed)
Pt reports recent pancreatitis, was hospitalized on 4/10. Onset this am of upper abd pain again with nausea. Denies vomiting or diarrhea.

## 2014-09-09 NOTE — Discharge Instructions (Signed)

## 2014-09-10 MED ORDER — HYDROMORPHONE HCL 1 MG/ML IJ SOLN: 0.5000 mg | INTRAMUSCULAR | Status: DC | PRN

## 2014-09-10 NOTE — Consult Note (Signed)
Reason for Consult:pancreatitis Referring Physician:  Kathrynn Humble, MD  [name] Blanchard is an 24 y.o. female.  HPI:  Pt is a 24 yo F with presumed gallstone pancreatitis.  She was admitted recently and discharged 2-3 weeks ago. She had necrotizing pancreatitis with a large pseudocyst and an SMV thrombosis.  She is on Xarelto.  She saw Dr. Brantley Stage Friday for follow up.  She developed sudden worsening pain this morning that did not respond to Percocet.  She had severe nausea as well. She had been having lower abdominal pain, but today has had epigastric pain.    Past Medical History  Diagnosis Date  . Anxiety     diagnosed in 2012  . Contraceptive use     Had nexplanon - removed, now on OCP  . Dysmenorrhea     Past Surgical History  Procedure Laterality Date  . Knee surgery Right 09/10/2010    Dr. Berenice Primas  . Tonsillectomy      24 y/o  . Adenoidectomy      24y/o  . Wisdom tooth extraction      unsure if 2 or 4 teeth    History reviewed. No pertinent family history.  Social History:  reports that she has never smoked. She does not have any smokeless tobacco history on file. She reports that she drinks alcohol. She reports that she does not use illicit drugs.  Allergies: No Known Allergies  Medications:  APAP-Pamabrom-Pyrilamine (PAMPRIN MULTI-SYMPTOM PO)    Ibuprofen (MIDOL PO)   ondansetron (ZOFRAN) 4 MG tablet   oxyCODONE-acetaminophen (PERCOCET/ROXICET) 5-325 MG per tablet   PEDIATRIC VITAMINS ACD-IRON PO   Rivaroxaban (XARELTO) 15 MG TABS tablet   feeding supplement, RESOURCE BREEZE, (RESOURCE BREEZE) LIQD   oxyCODONE (OXY IR/ROXICODONE) 5 MG immediate release tablet   polyethylene glycol (MIRALAX / GLYCOLAX) packet   promethazine (PHENERGAN) 12.5 MG tablet   rivaroxaban (XARELTO) 20 MG TABS tablet         Results for orders placed or performed during the hospital encounter of 09/09/14 (from the past 48 hour(s))  CBC with Differential     Status: None   Collection  Time: 09/09/14  2:59 PM  Result Value Ref Range   WBC 8.9 4.0 - 10.5 K/uL   RBC 4.45 3.87 - 5.11 MIL/uL   Hemoglobin 12.9 12.0 - 15.0 g/dL   HCT 39.7 36.0 - 46.0 %   MCV 89.2 78.0 - 100.0 fL   MCH 29.0 26.0 - 34.0 pg   MCHC 32.5 30.0 - 36.0 g/dL   RDW 13.1 11.5 - 15.5 %   Platelets 280 150 - 400 K/uL   Neutrophils Relative % 71 43 - 77 %   Neutro Abs 6.4 1.7 - 7.7 K/uL   Lymphocytes Relative 17 12 - 46 %   Lymphs Abs 1.5 0.7 - 4.0 K/uL   Monocytes Relative 8 3 - 12 %   Monocytes Absolute 0.7 0.1 - 1.0 K/uL   Eosinophils Relative 3 0 - 5 %   Eosinophils Absolute 0.2 0.0 - 0.7 K/uL   Basophils Relative 1 0 - 1 %   Basophils Absolute 0.0 0.0 - 0.1 K/uL  Lipase, blood     Status: Abnormal   Collection Time: 09/09/14  2:59 PM  Result Value Ref Range   Lipase 62 (H) 22 - 51 U/L  Comprehensive metabolic panel     Status: Abnormal   Collection Time: 09/09/14  2:59 PM  Result Value Ref Range   Sodium 135 135 - 145 mmol/L  Potassium 4.1 3.5 - 5.1 mmol/L   Chloride 100 (L) 101 - 111 mmol/L   CO2 23 22 - 32 mmol/L   Glucose, Bld 106 (H) 65 - 99 mg/dL   BUN 6 6 - 20 mg/dL   Creatinine, Ser 0.91 0.44 - 1.00 mg/dL   Calcium 9.6 8.9 - 10.3 mg/dL   Total Protein 8.9 (H) 6.5 - 8.1 g/dL   Albumin 4.0 3.5 - 5.0 g/dL   AST 238 (H) 15 - 41 U/L   ALT 264 (H) 14 - 54 U/L   Alkaline Phosphatase 206 (H) 38 - 126 U/L   Total Bilirubin 0.9 0.3 - 1.2 mg/dL   GFR calc non Af Amer >60 >60 mL/min   GFR calc Af Amer >60 >60 mL/min    Comment: (NOTE) The eGFR has been calculated using the CKD EPI equation. This calculation has not been validated in all clinical situations. eGFR's persistently <60 mL/min signify possible Chronic Kidney Disease.    Anion gap 12 5 - 15  I-Stat CG4 Lactic Acid, ED     Status: None   Collection Time: 09/09/14  4:18 PM  Result Value Ref Range   Lactic Acid, Venous 0.65 0.5 - 2.0 mmol/L  Urinalysis, Routine w reflex microscopic     Status: Abnormal   Collection Time:  09/09/14  6:41 PM  Result Value Ref Range   Color, Urine YELLOW YELLOW   APPearance CLEAR CLEAR   Specific Gravity, Urine 1.018 1.005 - 1.030   pH 5.0 5.0 - 8.0   Glucose, UA NEGATIVE NEGATIVE mg/dL   Hgb urine dipstick NEGATIVE NEGATIVE   Bilirubin Urine NEGATIVE NEGATIVE   Ketones, ur 40 (A) NEGATIVE mg/dL   Protein, ur NEGATIVE NEGATIVE mg/dL   Urobilinogen, UA 0.2 0.0 - 1.0 mg/dL   Nitrite NEGATIVE NEGATIVE   Leukocytes, UA NEGATIVE NEGATIVE    Comment: MICROSCOPIC NOT DONE ON URINES WITH NEGATIVE PROTEIN, BLOOD, LEUKOCYTES, NITRITE, OR GLUCOSE <1000 mg/dL.  POC Urine Pregnancy, ED (do NOT order at [facility])     Status: None   Collection Time: 09/09/14  6:43 PM  Result Value Ref Range   Preg Test, Ur NEGATIVE NEGATIVE    Comment:        THE SENSITIVITY OF THIS METHODOLOGY IS >24 mIU/mL   I-Stat CG4 Lactic Acid, ED     Status: None   Collection Time: 09/09/14  7:34 PM  Result Value Ref Range   Lactic Acid, Venous 1.54 0.5 - 2.0 mmol/L    Ct Abdomen Pelvis W Contrast  09/09/2014   CLINICAL DATA:  24 year old female with upper abdominal pain and nausea today. History of pancreatitis.  EXAM: CT ABDOMEN AND PELVIS WITH CONTRAST  TECHNIQUE: Multidetector CT imaging of the abdomen and pelvis was performed using the standard protocol following bolus administration of intravenous contrast.  CONTRAST:  127mL OMNIPAQUE IOHEXOL 300 MG/ML  SOLN  COMPARISON:  08/20/2014 and prior CTs dating back to 08/06/2014.  FINDINGS: Lower chest: Unremarkable except for minimal lingular and right middle lobe atelectasis/ scarring.  Hepatobiliary: The liver and gallbladder are unremarkable. There is no evidence of biliary dilatation.  Pancreas: A 4.5 x 9.5 cm pseudocyst along the pancreatic body is noted and a slightly more ill-defined 4 x 6 cm pseudocyst along the pancreatic tail. Nodularity along the peritoneal surfaces are likely related to recent inflammatory changes.  Spleen: Unremarkable  Adrenals/Urinary  Tract: The kidneys, adrenal glands and bladder are unremarkable.  Stomach/Bowel: There is some mass effect on the  stomach from the pancreatic body pseudocyst described above. There is no evidence of bowel obstruction. The appendix is normal.  Vascular/Lymphatic: Reactive lymph nodes within the mesenteric and retroperitoneum are noted. No evidence of aneurysm.  Reproductive: Uterus is unremarkable. A 3 cm right ovarian cyst is noted.  Other: No free fluid, or pneumoperitoneum.  Musculoskeletal: No acute or suspicious abnormalities.  IMPRESSION: 4.5 x 9.5 cm pancreatic body pseudocyst and 4 x 6 cm slightly more ill-defined pseudocyst along the pancreatic tail. The pancreatic body pseudocyst has some mass effect on the stomach.  3 cm right ovarian cyst.   Electronically Signed   By: Margarette Canada M.D.   On: 09/09/2014 20:33    Review of Systems  Constitutional: Negative for fever, chills, weight loss, malaise/fatigue and diaphoresis.  HENT: Negative.   Eyes: Negative.   Respiratory: Negative.   Cardiovascular: Negative.   Gastrointestinal: Positive for nausea, vomiting and abdominal pain. Negative for diarrhea and constipation.  Genitourinary: Negative.   Musculoskeletal: Negative.   Skin: Negative.   Neurological: Negative.  Negative for weakness.  Endo/Heme/Allergies: Negative.   Psychiatric/Behavioral: Negative.    Blood pressure 116/71, pulse 102, temperature 97.7 F (36.5 C), temperature source Oral, resp. rate 19, height 5' 1" (1.549 m), weight 75.161 kg (165 lb 11.2 oz), last menstrual period 08/06/2014, SpO2 97 %. Physical Exam  Constitutional: She is oriented to person, place, and time. She appears well-developed and well-nourished. No distress.  HENT:  Head: Normocephalic and atraumatic.  Eyes: Conjunctivae are normal. Pupils are equal, round, and reactive to light. No scleral icterus.  Neck: Normal range of motion. No tracheal deviation present. No thyromegaly present.  Cardiovascular:  Normal rate and regular rhythm.   Respiratory: Effort normal. No respiratory distress. She has no wheezes.  GI: Soft. She exhibits distension (slightly distended). She exhibits no mass. There is tenderness (mildly tender epigastrium). There is no rebound and no guarding.  Musculoskeletal: Normal range of motion.  Neurological: She is alert and oriented to person, place, and time.  Skin: Skin is warm and dry. No rash noted. She is not diaphoretic. No erythema. No pallor.  Psychiatric: She has a normal mood and affect. Her behavior is normal. Judgment and thought content normal.    Assessment/Plan: Necrotizing pancreatitis Pseudocyst Gallstones.  I think that her pain is coming from a gallstone that passed today.  She is feeling a bit better and would like to go home if we are not going to do anything different.  She will try clears.   If she is not having nausea or vomiting, she will follow up with Dr. Brantley Stage as an outpatient.  Her pseudocyst looks slightly improved.    She will need a lap chole, but still looks like she has too much inflammation.  I will give her additional oxycodone and phenergan.   [place] 09/10/2014, 12:20 AM

## 2014-09-12 LAB — URINE CULTURE: Colony Count: 25000

## 2014-09-13 ENCOUNTER — Telehealth (HOSPITAL_BASED_OUTPATIENT_CLINIC_OR_DEPARTMENT_OTHER): Payer: Self-pay | Admitting: Emergency Medicine

## 2014-09-13 ENCOUNTER — Other Ambulatory Visit: Payer: Managed Care, Other (non HMO)

## 2014-09-13 NOTE — Telephone Encounter (Signed)
Post ED Visit - Positive Culture Follow-up  Culture report reviewed by antimicrobial stewardship pharmacist: []  Wes Dulaney, Pharm.D., BCPS []  Celedonio MiyamotoJeremy Frens, Pharm.D., BCPS []  Georgina PillionElizabeth Martin, [address] BCPS []  [place]Minh Pham, [address]Ph[address]D., BCPS, AAHIVP []  Estella HuskMichelle Turner, Pharm.D., BCPS, AAHIVP []  Elder CyphersLorie Poole, [address]Ph[address] BCPS Tegan Mansam, Pharm.D.  Positive urine culture Staph Treated with none, asymptomatic,and no further patient follow-up is required at this time.  Berle MullMiller, Avanna Sowder 09/13/2014, 9:29 AM

## 2014-09-14 ENCOUNTER — Other Ambulatory Visit: Payer: Self-pay | Admitting: Gastroenterology

## 2014-09-14 DIAGNOSIS — K863 Pseudocyst of pancreas: Secondary | ICD-10-CM

## 2014-09-15 ENCOUNTER — Ambulatory Visit
Admission: RE | Admit: 2014-09-15 | Discharge: 2014-09-15 | Disposition: A | Discharge status: Home / Self Care | Payer: Managed Care, Other (non HMO) | Source: Ambulatory Visit | Attending: Gastroenterology | Admitting: Gastroenterology

## 2014-09-15 DIAGNOSIS — K863 Pseudocyst of pancreas: Secondary | ICD-10-CM

## 2014-09-29 DIAGNOSIS — K863 Pseudocyst of pancreas: Secondary | ICD-10-CM | POA: Insufficient documentation

## 2014-10-06 ENCOUNTER — Emergency Department (HOSPITAL_BASED_OUTPATIENT_CLINIC_OR_DEPARTMENT_OTHER)
Admission: EM | Admit: 2014-10-06 | Discharge: 2014-10-06 | Disposition: A | Discharge status: Home / Self Care | Payer: Managed Care, Other (non HMO) | Attending: Emergency Medicine | Admitting: Emergency Medicine

## 2014-10-06 ENCOUNTER — Encounter (HOSPITAL_BASED_OUTPATIENT_CLINIC_OR_DEPARTMENT_OTHER): Payer: Self-pay | Admitting: *Deleted

## 2014-10-06 DIAGNOSIS — R1013 Epigastric pain: Secondary | ICD-10-CM | POA: Diagnosis not present

## 2014-10-06 DIAGNOSIS — Z79899 Other long term (current) drug therapy: Secondary | ICD-10-CM | POA: Diagnosis not present

## 2014-10-06 DIAGNOSIS — Z3202 Encounter for pregnancy test, result negative: Secondary | ICD-10-CM | POA: Insufficient documentation

## 2014-10-06 DIAGNOSIS — R110 Nausea: Secondary | ICD-10-CM | POA: Diagnosis not present

## 2014-10-06 DIAGNOSIS — Z8742 Personal history of other diseases of the female genital tract: Secondary | ICD-10-CM | POA: Diagnosis not present

## 2014-10-06 DIAGNOSIS — Z8659 Personal history of other mental and behavioral disorders: Secondary | ICD-10-CM | POA: Diagnosis not present

## 2014-10-06 DIAGNOSIS — R1011 Right upper quadrant pain: Secondary | ICD-10-CM | POA: Insufficient documentation

## 2014-10-06 DIAGNOSIS — Z7901 Long term (current) use of anticoagulants: Secondary | ICD-10-CM | POA: Diagnosis not present

## 2014-10-06 DIAGNOSIS — R1012 Left upper quadrant pain: Secondary | ICD-10-CM | POA: Insufficient documentation

## 2014-10-06 DIAGNOSIS — R109 Unspecified abdominal pain: Secondary | ICD-10-CM | POA: Diagnosis present

## 2014-10-06 DIAGNOSIS — R1010 Upper abdominal pain, unspecified: Secondary | ICD-10-CM

## 2014-10-06 LAB — COMPREHENSIVE METABOLIC PANEL
ALBUMIN: 4.5 g/dL (ref 3.5–5.0)
ALT: 18 U/L (ref 14–54)
ANION GAP: 6 (ref 5–15)
AST: 17 U/L (ref 15–41)
Alkaline Phosphatase: 114 U/L (ref 38–126)
BUN: 10 mg/dL (ref 6–20)
CO2: 28 mmol/L (ref 22–32)
CREATININE: 0.86 mg/dL (ref 0.44–1.00)
Calcium: 9.1 mg/dL (ref 8.9–10.3)
Chloride: 104 mmol/L (ref 101–111)
GFR calc Af Amer: >60 mL/min (ref >60)
GFR calc non Af Amer: >60 mL/min (ref >60)
Glucose, Bld: 86 mg/dL (ref 65–99)
Potassium: 3.7 mmol/L (ref 3.5–5.1)
Sodium: 138 mmol/L (ref 135–145)
Total Bilirubin: 0.4 mg/dL (ref 0.3–1.2)
Total Protein: 8.3 g/dL — ABNORMAL HIGH (ref 6.5–8.1)

## 2014-10-06 LAB — CBC WITH DIFFERENTIAL/PLATELET
BASOS PCT: 0 % (ref 0–1)
Basophils Absolute: 0 10*3/uL (ref 0.0–0.1)
Eosinophils Absolute: 0.2 10*3/uL (ref 0.0–0.7)
Eosinophils Relative: 2 % (ref 0–5)
HEMATOCRIT: 39.3 % (ref 36.0–46.0)
Hemoglobin: 12.5 g/dL (ref 12.0–15.0)
LYMPHS ABS: 2.6 10*3/uL (ref 0.7–4.0)
Lymphocytes Relative: 27 % (ref 12–46)
MCH: 28.5 pg (ref 26.0–34.0)
MCHC: 31.8 g/dL (ref 30.0–36.0)
MCV: 89.7 fL (ref 78.0–100.0)
MONOS PCT: 7 % (ref 3–12)
Monocytes Absolute: 0.7 10*3/uL (ref 0.1–1.0)
NEUTROS ABS: 6.2 10*3/uL (ref 1.7–7.7)
NEUTROS PCT: 64 % (ref 43–77)
PLATELETS: 288 10*3/uL (ref 150–400)
RBC: 4.38 MIL/uL (ref 3.87–5.11)
RDW: 12.9 % (ref 11.5–15.5)
WBC: 9.8 10*3/uL (ref 4.0–10.5)

## 2014-10-06 LAB — URINALYSIS, ROUTINE W REFLEX MICROSCOPIC
Bilirubin Urine: NEGATIVE
Glucose, UA: NEGATIVE mg/dL
Hgb urine dipstick: NEGATIVE
KETONES UR: NEGATIVE mg/dL
Leukocytes, UA: NEGATIVE
Nitrite: NEGATIVE
Protein, ur: NEGATIVE mg/dL
SPECIFIC GRAVITY, URINE: 1.036 — AB (ref 1.005–1.030)
Urobilinogen, UA: 1 mg/dL (ref 0.0–1.0)
pH: 7 (ref 5.0–8.0)

## 2014-10-06 LAB — LIPASE, BLOOD: Lipase: 114 U/L — ABNORMAL HIGH (ref 22–51)

## 2014-10-06 LAB — PREGNANCY, URINE: Preg Test, Ur: NEGATIVE

## 2014-10-06 MED ORDER — PROMETHAZINE HCL 25 MG/ML IJ SOLN
25.0000 mg | Freq: Once | INTRAMUSCULAR | Status: AC
  Administered 2014-10-06: 25 mg via INTRAVENOUS
  Filled 2014-10-06: qty 1

## 2014-10-06 MED ORDER — ONDANSETRON HCL 4 MG/2ML IJ SOLN
4.0000 mg | Freq: Once | INTRAMUSCULAR | Status: AC
  Administered 2014-10-06: 4 mg via INTRAVENOUS
  Filled 2014-10-06: qty 2

## 2014-10-06 MED ORDER — HYDROMORPHONE HCL 1 MG/ML IJ SOLN
1.0000 mg | Freq: Once | INTRAMUSCULAR | Status: AC
  Administered 2014-10-06: 1 mg via INTRAVENOUS
  Filled 2014-10-06: qty 1

## 2014-10-06 NOTE — ED Notes (Signed)
C/o upper abd painand nausea x 4 hours, pain meds at home not helping ( percocet, dilaudid, zofran)

## 2014-10-06 NOTE — ED Notes (Signed)
Pt c/o upper left abd pain x 1 days

## 2014-10-06 NOTE — Discharge Instructions (Signed)
Abdominal Pain °Many things can cause abdominal pain. Usually, abdominal pain is not caused by a disease and will improve without treatment. It can often be observed and treated at home. Your health care provider will do a physical exam and possibly order blood tests and X-rays to help determine the seriousness of your pain. However, in many cases, more time must pass before a clear cause of the pain can be found. Before that point, your health care provider may not know if you need more testing or further treatment. °HOME CARE INSTRUCTIONS  °Monitor your abdominal pain for any changes. The following actions may help to alleviate any discomfort you are experiencing: °· Only take over-the-counter or prescription medicines as directed by your health care provider. °· Do not take laxatives unless directed to do so by your health care provider. °· Try a clear liquid diet (broth, tea, or water) as directed by your health care provider. Slowly move to a bland diet as tolerated. °SEEK MEDICAL CARE IF: °· You have unexplained abdominal pain. °· You have abdominal pain associated with nausea or diarrhea. °· You have pain when you urinate or have a bowel movement. °· You experience abdominal pain that wakes you in the night. °· You have abdominal pain that is worsened or improved by eating food. °· You have abdominal pain that is worsened with eating fatty foods. °· You have a fever. °SEEK IMMEDIATE MEDICAL CARE IF:  °· Your pain does not go away within 2 hours. °· You keep throwing up (vomiting). °· Your pain is felt only in portions of the abdomen, such as the right side or the left lower portion of the abdomen. °· You pass bloody or black tarry stools. °MAKE SURE YOU: °· Understand these instructions.   °· Will watch your condition.   °· Will get help right away if you are not doing well or get worse.   °Document Released: 01/22/2005 Document Revised: 04/19/2013 Document Reviewed: 12/22/2012 °[employer]® Patient Information  ©2015 [employer]. This information is not intended to replace advice given to you by your health care provider. Make sure you discuss any questions you have with your health care provider. ° °Nausea and Vomiting °Nausea is a sick feeling that often comes before throwing up (vomiting). Vomiting is a reflex where stomach contents come out of your mouth. Vomiting can cause severe loss of body fluids (dehydration). Children and elderly adults can become dehydrated quickly, especially if they also have diarrhea. Nausea and vomiting are symptoms of a condition or disease. It is important to find the cause of your symptoms. °CAUSES  °· Direct irritation of the stomach lining. This irritation can result from increased acid production (gastroesophageal reflux disease), infection, food poisoning, taking certain medicines (such as nonsteroidal anti-inflammatory drugs), alcohol use, or tobacco use. °· Signals from the brain. These signals could be caused by a headache, heat exposure, an inner ear disturbance, increased pressure in the brain from injury, infection, a tumor, or a concussion, pain, emotional stimulus, or metabolic problems. °· An obstruction in the gastrointestinal tract (bowel obstruction). °· Illnesses such as diabetes, hepatitis, gallbladder problems, appendicitis, kidney problems, cancer, sepsis, atypical symptoms of a heart attack, or eating disorders. °· Medical treatments such as chemotherapy and radiation. °· Receiving medicine that makes you sleep (general anesthetic) during surgery. °DIAGNOSIS °Your caregiver may ask for tests to be done if the problems do not improve after a few days. Tests may also be done if symptoms are severe or if the reason for the nausea   and vomiting is not clear. Tests may include: °· Urine tests. °· Blood tests. °· Stool tests. °· Cultures (to look for evidence of infection). °· X-rays or other imaging studies. °Test results can help your caregiver make decisions about  treatment or the need for additional tests. °TREATMENT °You need to stay well hydrated. Drink frequently but in small amounts. You may wish to drink water, sports drinks, clear broth, or eat frozen ice pops or gelatin dessert to help stay hydrated. When you eat, eating slowly may help prevent nausea. There are also some antinausea medicines that may help prevent nausea. °HOME CARE INSTRUCTIONS  °· Take all medicine as directed by your caregiver. °· If you do not have an appetite, do not force yourself to eat. However, you must continue to drink fluids. °· If you have an appetite, eat a normal diet unless your caregiver tells you differently. °¨ Eat a variety of complex carbohydrates (rice, wheat, potatoes, bread), lean meats, yogurt, fruits, and vegetables. °¨ Avoid high-fat foods because they are more difficult to digest. °· Drink enough water and fluids to keep your urine clear or pale yellow. °· If you are dehydrated, ask your caregiver for specific rehydration instructions. Signs of dehydration may include: °¨ Severe thirst. °¨ Dry lips and mouth. °¨ Dizziness. °¨ Dark urine. °¨ Decreasing urine frequency and amount. °¨ Confusion. °¨ Rapid breathing or pulse. °SEEK IMMEDIATE MEDICAL CARE IF:  °· You have blood or brown flecks (like coffee grounds) in your vomit. °· You have black or bloody stools. °· You have a severe headache or stiff neck. °· You are confused. °· You have severe abdominal pain. °· You have chest pain or trouble breathing. °· You do not urinate at least once every 8 hours. °· You develop cold or clammy skin. °· You continue to vomit for longer than 24 to 48 hours. °· You have a fever. °MAKE SURE YOU:  °· Understand these instructions. °· Will watch your condition. °· Will get help right away if you are not doing well or get worse. °Document Released: 04/14/2005 Document Revised: 07/07/2011 Document Reviewed: 09/11/2010 °[employer]® Patient Information ©2015 [employer]. This information is not  intended to replace advice given to you by your health care provider. Make sure you discuss any questions you have with your health care provider. ° °

## 2014-10-06 NOTE — ED Provider Notes (Signed)
CSN: [id number]     Arrival date & time 10/06/14  1906 History  This chart was scribed for [name] Loveless, MD by Bronson Curb, ED Scribe. This patient was seen in room MH05/MH05 and the patient's care was started at 8:25 PM.   Chief Complaint  Patient presents with  . Abdominal Pain    The history is provided by the patient. No language interpreter was used.     HPI Comments: [name] Blanchard is a 24 y.o. female who presents to the Emergency Department complaining of constant, upper abdominal pain with associated nausea for the past 5 hours. 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 She states her pain is typically controlled with phenergan, Percocet, and Zofran, however, she reports no relief with this current episode. She reports her last BM was approximately 24 hours ago and was mostly loose stool. She denies fever or vomiting.   Past Medical History  Diagnosis Date  . Anxiety     diagnosed in 2012  . Contraceptive use     Had nexplanon - removed, now on OCP  . Dysmenorrhea    Past Surgical History  Procedure Laterality Date  . Knee surgery Right 09/10/2010    Dr. Luiz Blare  . Tonsillectomy      24 y/o  . Adenoidectomy      24y/o  . Wisdom tooth extraction      unsure if 2 or 4 teeth   History reviewed. No pertinent family history. History  Substance Use Topics  . Smoking status: Never Smoker   . Smokeless tobacco: Not on file  . Alcohol Use: 0.0 oz/week    0 Standard drinks or equivalent per week     Comment: Occasionally few mixed drinks with friends   OB History    No data available     Review of Systems  Constitutional: Negative for fever.  Gastrointestinal: Positive for nausea and abdominal pain. Negative for vomiting.  Genitourinary: Negative for dysuria.  All other systems reviewed and are negative.     Allergies  Review of patient's allergies indicates no known allergies.  Home Medications    Prior to Admission medications   Medication Sig Start Date End Date Taking? Authorizing Provider  HYDROmorphone (DILAUDID) 2 MG tablet Take by mouth every 4 (four) hours as needed for severe pain.   Yes Historical Provider, MD  APAP-Pamabrom-Pyrilamine (PAMPRIN MULTI-SYMPTOM PO) Take 1 tablet by mouth daily as needed (menstrual cramps).    Historical Provider, MD  feeding supplement, RESOURCE BREEZE, (RESOURCE BREEZE) LIQD Take 1 Container by mouth 2 (two) times daily between meals. Patient not taking: Reported on 09/09/2014 08/24/14   [name], MD  Ibuprofen (MIDOL PO) Take 1 tablet by mouth daily as needed (menstrual cramps).    Historical Provider, MD  ondansetron (ZOFRAN) 4 MG tablet Take 4 mg by mouth 4 (four) times daily as needed for nausea or vomiting.    Historical Provider, MD  oxyCODONE (OXY IR/ROXICODONE) 5 MG immediate release tablet Take 1-3 tablets (5-15 mg total) by mouth every 4 (four) hours as needed for moderate pain or severe pain. 09/09/14   [name], MD  oxyCODONE-acetaminophen (PERCOCET/ROXICET) 5-325 MG per tablet Take 1 tablet by mouth 4 (four) times daily as needed for severe pain.    Historical Provider, MD  PEDIATRIC VITAMINS ACD-IRON PO Take 1 tablet by mouth daily.    Historical Provider, MD  polyethylene glycol (MIRALAX / GLYCOLAX) packet Take 17 g by mouth daily as  needed. Patient not taking: Reported on 09/09/2014 08/24/14   [name], MD  promethazine (PHENERGAN) 12.5 MG tablet Take 1-2 tablets (12.5-25 mg total) by mouth every 6 (six) hours as needed for nausea, vomiting or refractory nausea / vomiting. 09/09/14   [name], MD  Rivaroxaban (XARELTO) 15 MG TABS tablet Take 1 tablet (15 mg total) by mouth 2 (two) times daily with a meal. 08/24/14 09/12/14  [name], MD  rivaroxaban (XARELTO) 20 MG TABS tablet Take 1 tablet (20 mg total) by mouth daily with supper. 09/13/14   [name], MD   Triage Vitals: BP 135/79 mmHg  Pulse 97  Temp(Src) 97.9 F  (36.6 C) (Oral)  Resp 16  Ht 5\' 1"  (1.549 m)  Wt 161 lb (73.029 kg)  BMI 30.44 kg/m2  SpO2 100%  LMP 09/22/2014  Physical Exam  Constitutional: She is oriented to person, place, and time. She appears well-developed and well-nourished.  HENT:  Head: Normocephalic and atraumatic.  Right Ear: External ear normal.  Left Ear: External ear normal.  Nose: Nose normal.  Eyes: Right eye exhibits no discharge. Left eye exhibits no discharge.  Cardiovascular: Normal rate, regular rhythm and normal heart sounds.   Pulmonary/Chest: Effort normal and breath sounds normal.  Abdominal: Soft. There is tenderness in the right upper quadrant, epigastric area and left upper quadrant.  Neurological: She is alert and oriented to person, place, and time.  Skin: Skin is warm and dry.  Nursing note and vitals reviewed.   ED Course  Procedures (including critical care time)  DIAGNOSTIC STUDIES: Oxygen Saturation is 100% on room air, normal by my interpretation.    COORDINATION OF CARE: At 2029 Discussed treatment plan with patient. Patient agrees.   Labs Review Labs Reviewed  URINALYSIS, ROUTINE W REFLEX MICROSCOPIC (NOT AT [facility]) - Abnormal; Notable for the following:    Specific Gravity, Urine 1.036 (*)    All other components within normal limits  LIPASE, BLOOD - Abnormal; Notable for the following:    Lipase 114 (*)    All other components within normal limits  COMPREHENSIVE METABOLIC PANEL - Abnormal; Notable for the following:    Total Protein 8.3 (*)    All other components within normal limits  PREGNANCY, URINE  CBC WITH DIFFERENTIAL/PLATELET    Imaging Review No results found.   EKG Interpretation None      MDM   Final diagnoses:  Upper abdominal pain  Nausea without vomiting    Patient's abdominal exam is soft with reproducible tenderness in the upper abdomen. Does have a mild lipase elevation but otherwise her lab work is unremarkable. She feels much better after dose  of Dilaudid and her nausea has now been controlled. She's never had any vomiting. I do not feel she needs repeat imaging as this pain is not as severe as the pain she's had in the past with her more severe disease. She has surgery follow-up in 3 days. At this point she is stable for discharge and I have discussed strict return precautions with her and her family.  I personally performed the services described in this documentation, which was scribed in my presence. The recorded information has been reviewed and is accurate.    [name] Loveless, MD 10/06/14 [phone]

## 2014-10-09 DIAGNOSIS — K55069 Acute infarction of intestine, part and extent unspecified: Secondary | ICD-10-CM | POA: Insufficient documentation

## 2014-10-22 DIAGNOSIS — R000 Tachycardia, unspecified: Secondary | ICD-10-CM | POA: Insufficient documentation

## 2015-07-02 LAB — CBC AND DIFFERENTIAL
HEMATOCRIT: 42 % (ref 36–46)
Hemoglobin: 14.1 g/dL (ref 12.0–16.0)
PLATELETS: 334 10*3/uL (ref 150–399)
WBC: 8 10*3/mL

## 2015-07-02 LAB — BASIC METABOLIC PANEL
BUN: 14 mg/dL (ref 4–21)
CREATININE: 0.9 mg/dL (ref 0.5–1.1)
GLUCOSE: 89 mg/dL
Potassium: 4.5 mmol/L (ref 3.4–5.3)
Sodium: 138 mmol/L (ref 137–147)

## 2015-07-02 LAB — HEPATIC FUNCTION PANEL
ALK PHOS: 52 U/L (ref 25–125)
ALT: 15 U/L (ref 7–35)
AST: 15 U/L (ref 13–35)
BILIRUBIN, TOTAL: 0.9 mg/dL

## 2015-08-28 LAB — BASIC METABOLIC PANEL
BUN: 14 mg/dL (ref 4–21)
Creatinine: 0.9 mg/dL (ref 0.5–1.1)
GLUCOSE: 93 mg/dL
POTASSIUM: 4.4 mmol/L (ref 3.4–5.3)
Sodium: 140 mmol/L (ref 137–147)

## 2015-08-28 LAB — HEPATIC FUNCTION PANEL
ALT: 15 U/L (ref 7–35)
AST: 16 U/L (ref 13–35)
Alkaline Phosphatase: 58 U/L (ref 25–125)
BILIRUBIN, TOTAL: 0.6 mg/dL

## 2015-08-28 LAB — CBC AND DIFFERENTIAL
HEMATOCRIT: 42 % (ref 36–46)
HEMOGLOBIN: 14 g/dL (ref 12.0–16.0)
PLATELETS: 334 10*3/uL (ref 150–399)
WBC: 9.3 10*3/mL

## 2015-12-02 ENCOUNTER — Inpatient Hospital Stay (HOSPITAL_COMMUNITY): Payer: Managed Care, Other (non HMO)

## 2015-12-02 ENCOUNTER — Inpatient Hospital Stay (HOSPITAL_COMMUNITY)
Admission: AD | Admit: 2015-12-02 | Discharge: 2015-12-02 | Disposition: A | Discharge status: Home / Self Care | Payer: Managed Care, Other (non HMO) | Source: Ambulatory Visit | Attending: Obstetrics & Gynecology | Admitting: Obstetrics & Gynecology

## 2015-12-02 ENCOUNTER — Encounter (HOSPITAL_COMMUNITY): Payer: Self-pay | Admitting: *Deleted

## 2015-12-02 DIAGNOSIS — R102 Pelvic and perineal pain: Secondary | ICD-10-CM | POA: Diagnosis not present

## 2015-12-02 DIAGNOSIS — N760 Acute vaginitis: Secondary | ICD-10-CM | POA: Insufficient documentation

## 2015-12-02 DIAGNOSIS — N83201 Unspecified ovarian cyst, right side: Secondary | ICD-10-CM | POA: Insufficient documentation

## 2015-12-02 DIAGNOSIS — A499 Bacterial infection, unspecified: Secondary | ICD-10-CM | POA: Diagnosis not present

## 2015-12-02 DIAGNOSIS — B9689 Other specified bacterial agents as the cause of diseases classified elsewhere: Secondary | ICD-10-CM

## 2015-12-02 DIAGNOSIS — R1032 Left lower quadrant pain: Secondary | ICD-10-CM | POA: Diagnosis present

## 2015-12-02 DIAGNOSIS — Z975 Presence of (intrauterine) contraceptive device: Secondary | ICD-10-CM | POA: Insufficient documentation

## 2015-12-02 DIAGNOSIS — F419 Anxiety disorder, unspecified: Secondary | ICD-10-CM | POA: Diagnosis not present

## 2015-12-02 LAB — WET PREP, GENITAL
SPERM: NONE SEEN
Trich, Wet Prep: NONE SEEN
Yeast Wet Prep HPF POC: NONE SEEN

## 2015-12-02 LAB — POCT PREGNANCY, URINE: Preg Test, Ur: NEGATIVE

## 2015-12-02 MED ORDER — KETOROLAC TROMETHAMINE 30 MG/ML IJ SOLN
30.0000 mg | Freq: Once | INTRAMUSCULAR | Status: AC
  Administered 2015-12-02: 30 mg via INTRAVENOUS
  Filled 2015-12-02: qty 1

## 2015-12-02 MED ORDER — METRONIDAZOLE 500 MG PO TABS: 500.0000 mg | ORAL_TABLET | Freq: Two times a day (BID) | ORAL | 0 refills | Status: DC

## 2015-12-02 MED ORDER — IBUPROFEN 800 MG PO TABS: 800.0000 mg | ORAL_TABLET | Freq: Three times a day (TID) | ORAL | 0 refills | Status: DC | PRN

## 2015-12-02 NOTE — MAU Provider Note (Signed)
History     CSN: [id number]  Arrival date and time: 12/02/15 1507   None     No chief complaint on file.  G1 non-pregnant female c/o LLQ pain since 11 am. She reports acute onset of achy pain that is intermittent. She reports the pain causes her to "double over" and feel nauseated. She denies fever. She denies constipation and diarrhea. She denies urinary sx. She had Skyla placed about 2 wks ago and has had some mild intermittent cramping and spotting. She has hx of mesentaric embolus in 2016 and reports the pain associated with that was upper abdominal and constant.    Pertinent Gynecological History: Menses: 11/14/15 Contraception: IUD Sexually transmitted diseases: no past history   Past Medical History:  Diagnosis Date  . Anxiety    diagnosed in 2012  . Contraceptive use    Had nexplanon - removed, now on OCP  . Dysmenorrhea     Past Surgical History:  Procedure Laterality Date  . ADENOIDECTOMY     25y/o  . CHOLECYSTECTOMY    . KNEE SURGERY Right 09/10/2010   Dr. Luiz Blare  . TONSILLECTOMY     25 y/o  . TONSILLECTOMY    . WISDOM TOOTH EXTRACTION     unsure if 2 or 4 teeth    No family history on file.  Social History  Substance Use Topics  . Smoking status: Never Smoker  . Smokeless tobacco: Never Used  . Alcohol use 0.0 oz/week     Comment: Occasionally few mixed drinks with friends    Allergies:  Allergies  Allergen Reactions  . Morphine Other (See Comments)    Respiratory rate dropped    Prescriptions Prior to Admission  Medication Sig Dispense Refill Last Dose  . ALPRAZolam (XANAX) 0.5 MG tablet Take 0.5 mg by mouth at bedtime as needed for anxiety.     . Levonorgestrel (SKYLA IU) by Intrauterine route.     . ondansetron (ZOFRAN) 4 MG tablet Take 4 mg by mouth 4 (four) times daily as needed for nausea or vomiting.   Past Month at Unknown time    Review of Systems  Constitutional: Negative.   Gastrointestinal: Positive for abdominal pain and  nausea. Negative for constipation and diarrhea.  Genitourinary: Negative.    Physical Exam   Blood pressure 137/81, pulse 91, temperature 98.4 F (36.9 C), temperature source Oral, resp. rate 18, height  (1.549 m), weight 83.9 kg (185 lb), last menstrual period 11/14/2015.  Physical Exam  Constitutional: She appears well-developed and well-nourished.  HENT:  Head: Normocephalic and atraumatic.  Neck: Normal range of motion. Neck supple.  Cardiovascular: Normal rate.   Respiratory: Effort normal.  GI: Soft. She exhibits no distension and no mass. There is tenderness (LLQ). There is no rebound and no guarding.  Genitourinary:  Genitourinary Comments: External: no lesions Vagina: rugated, scant bloody discharge, string visualized Uterus: no enlarged, anteverted, non tender, no CMT Adnexae: no masses, mild tenderness left, no tenderness right    Results for orders placed or performed during the hospital encounter of 12/02/15 (from the past 24 hour(s))  Pregnancy, urine POC     Status: None   Collection Time: 12/02/15  3:47 PM  Result Value Ref Range   Preg Test, Ur NEGATIVE NEGATIVE  Wet prep, genital     Status: Abnormal   Collection Time: 12/02/15  3:54 PM  Result Value Ref Range   Yeast Wet Prep HPF POC NONE SEEN NONE SEEN   Trich, Wet Prep  NONE SEEN NONE SEEN   Clue Cells Wet Prep HPF POC PRESENT (A) NONE SEEN   WBC, Wet Prep HPF POC FEW (A) NONE SEEN   Sperm NONE SEEN    [place] Transvaginal Non-ob  Result Date: 12/02/2015 CLINICAL DATA:  Pelvic cramping beginning this morning. EXAM: TRANSABDOMINAL AND TRANSVAGINAL ULTRASOUND OF PELVIS TECHNIQUE: Both transabdominal and transvaginal ultrasound examinations of the pelvis were performed. Transabdominal technique was performed for global imaging of the pelvis including uterus, ovaries, adnexal regions, and pelvic cul-de-sac. It was necessary to proceed with endovaginal exam following the transabdominal exam to visualize the  endometrium and ovaries. COMPARISON:  CT abdomen and pelvis 09/09/2014. FINDINGS: Uterus Measurements: 7.7 x 3.6 x 3.8 cm. No fibroids or other mass visualized. Endometrium Thickness: 0.7 cm.  IUD in place.  No focal abnormality visualized. Right ovary Measurements: 5.1 x 2.8 x 3.1 cm. A cyst measuring 2.5 x 2.6 x 2.9 cm is identified and has an appearance most consistent with a hemorrhagic cyst. Left ovary Measurements: 2.9 x 2.2 x 2.3 cm. Normal appearance/no adnexal mass. Other findings No abnormal free fluid. IMPRESSION: Hemorrhagic right ovarian cyst measuring 2.9 cm in diameter. Otherwise negative exam. Electronically Signed   By: Drusilla Kannerhomas  Dalessio M.D.   On: 12/02/2015 17:35   [place] Pelvis Complete  Result Date: 12/02/2015 CLINICAL DATA:  Pelvic cramping beginning this morning. EXAM: TRANSABDOMINAL AND TRANSVAGINAL ULTRASOUND OF PELVIS TECHNIQUE: Both transabdominal and transvaginal ultrasound examinations of the pelvis were performed. Transabdominal technique was performed for global imaging of the pelvis including uterus, ovaries, adnexal regions, and pelvic cul-de-sac. It was necessary to proceed with endovaginal exam following the transabdominal exam to visualize the endometrium and ovaries. COMPARISON:  CT abdomen and pelvis 09/09/2014. FINDINGS: Uterus Measurements: 7.7 x 3.6 x 3.8 cm. No fibroids or other mass visualized. Endometrium Thickness: 0.7 cm.  IUD in place.  No focal abnormality visualized. Right ovary Measurements: 5.1 x 2.8 x 3.1 cm. A cyst measuring 2.5 x 2.6 x 2.9 cm is identified and has an appearance most consistent with a hemorrhagic cyst. Left ovary Measurements: 2.9 x 2.2 x 2.3 cm. Normal appearance/no adnexal mass. Other findings No abnormal free fluid. IMPRESSION: Hemorrhagic right ovarian cyst measuring 2.9 cm in diameter. Otherwise negative exam. Electronically Signed   By: Drusilla Kannerhomas  Dalessio M.D.   On: 12/02/2015 17:35    MAU Course  Procedures  MDM Labs and US ordered and  reviewed. No evidence of acute abd or pelvic process. Findings discussed with Dr. Langston MaskerMorris. Will treat BV. Stable for discharge home.  Assessment and Plan   1. Female pelvic pain   2. Pelvic pain in female   3. Bacterial vaginosis    Discharge home Flagyl 500 mg po bid x7 days Ibuprofen 800 mg po q8 hrs prn #30 Increase water intake Daily probiotic with lactobacillus and acidophilus Follow up at [facility] as scheduled on 8/15 Return for worsening sx  Donette LarryMelanie Masae Lukacs, CNM 12/02/2015, 3:58 PM

## 2015-12-02 NOTE — MAU Note (Signed)
Had skyla put in July 19, cramping started this morning took mydol a shower and no relief.

## 2015-12-02 NOTE — Discharge Instructions (Signed)

## 2015-12-03 LAB — GC/CHLAMYDIA PROBE AMP (~~LOC~~) NOT AT ARMC
Chlamydia: NEGATIVE
Neisseria Gonorrhea: NEGATIVE

## 2016-02-18 LAB — CBC AND DIFFERENTIAL
HEMATOCRIT: 41 % (ref 36–46)
Hemoglobin: 13.8 g/dL (ref 12.0–16.0)
Platelets: 307 10*3/uL (ref 150–399)
WBC: 10.3 10^3/mL

## 2016-02-18 LAB — BASIC METABOLIC PANEL
BUN: 15 mg/dL (ref 4–21)
Creatinine: 0.9 mg/dL (ref 0.5–1.1)
Glucose: 107 mg/dL
Potassium: 4.2 mmol/L (ref 3.4–5.3)
SODIUM: 142 mmol/L (ref 137–147)

## 2016-02-18 LAB — HEPATIC FUNCTION PANEL
ALK PHOS: 47 U/L (ref 25–125)
ALT: 12 U/L (ref 7–35)
AST: 13 U/L (ref 13–35)
BILIRUBIN, TOTAL: 0.4 mg/dL

## 2016-02-18 LAB — HEMOGLOBIN A1C: Hemoglobin A1C: 5.5

## 2016-06-05 ENCOUNTER — Encounter: Payer: Self-pay | Admitting: Family Medicine

## 2016-06-05 ENCOUNTER — Ambulatory Visit (INDEPENDENT_AMBULATORY_CARE_PROVIDER_SITE_OTHER): Payer: Managed Care, Other (non HMO) | Admitting: Family Medicine

## 2016-06-05 VITALS — BP 102/80 | HR 87 | Resp 12 | Ht 61.0 in | Wt 183.2 lb

## 2016-06-05 DIAGNOSIS — R1010 Upper abdominal pain, unspecified: Secondary | ICD-10-CM

## 2016-06-05 DIAGNOSIS — D72829 Elevated white blood cell count, unspecified: Secondary | ICD-10-CM

## 2016-06-05 DIAGNOSIS — R591 Generalized enlarged lymph nodes: Secondary | ICD-10-CM | POA: Diagnosis not present

## 2016-06-05 DIAGNOSIS — R110 Nausea: Secondary | ICD-10-CM | POA: Diagnosis not present

## 2016-06-05 DIAGNOSIS — R7401 Elevation of levels of liver transaminase levels: Secondary | ICD-10-CM

## 2016-06-05 DIAGNOSIS — R740 Nonspecific elevation of levels of transaminase and lactic acid dehydrogenase [LDH]: Secondary | ICD-10-CM

## 2016-06-05 LAB — CBC WITH DIFFERENTIAL/PLATELET
BASOS PCT: 0.4 % (ref 0.0–3.0)
Basophils Absolute: 0.1 10*3/uL (ref 0.0–0.1)
EOS PCT: 13.8 % — AB (ref 0.0–5.0)
Eosinophils Absolute: 1.8 10*3/uL — ABNORMAL HIGH (ref 0.0–0.7)
HCT: 42.9 % (ref 36.0–46.0)
HEMOGLOBIN: 14.2 g/dL (ref 12.0–15.0)
Lymphocytes Relative: 14.4 % (ref 12.0–46.0)
Lymphs Abs: 1.8 10*3/uL (ref 0.7–4.0)
MCHC: 33.1 g/dL (ref 30.0–36.0)
MCV: 89.7 fl (ref 78.0–100.0)
MONO ABS: 0.6 10*3/uL (ref 0.1–1.0)
Monocytes Relative: 4.8 % (ref 3.0–12.0)
Neutro Abs: 8.5 10*3/uL — ABNORMAL HIGH (ref 1.4–7.7)
Neutrophils Relative %: 66.6 % (ref 43.0–77.0)
Platelets: 324 10*3/uL (ref 150.0–400.0)
RBC: 4.78 Mil/uL (ref 3.87–5.11)
RDW: 13.7 % (ref 11.5–15.5)
WBC: 12.7 10*3/uL — AB (ref 4.0–10.5)

## 2016-06-05 LAB — MONONUCLEOSIS SCREEN: Mono Screen: NEGATIVE

## 2016-06-05 MED ORDER — OMEPRAZOLE 20 MG PO CPDR: 20.0000 mg | DELAYED_RELEASE_CAPSULE | Freq: Every day | ORAL | 0 refills | Status: DC

## 2016-06-05 MED ORDER — ONDANSETRON HCL 4 MG PO TABS: 4.0000 mg | ORAL_TABLET | Freq: Three times a day (TID) | ORAL | 0 refills | Status: AC | PRN

## 2016-06-05 MED ORDER — TRAMADOL HCL 50 MG PO TABS: 50.0000 mg | ORAL_TABLET | Freq: Two times a day (BID) | ORAL | 0 refills | Status: AC | PRN

## 2016-06-05 NOTE — Progress Notes (Signed)
Pre visit review using our clinic review tool, if applicable. No additional management support is needed unless otherwise documented below in the visit note. 

## 2016-06-05 NOTE — Progress Notes (Signed)
HPI:   Ms.[name] Blanchard is a 26 y.o. female, who is here today c/o upper abdominal pain.  PCP: Dr Simona Huh   3 weeks of intermittent upper abdominal pain,dull/cramping, 6/10 now,  no radiates, exacerbated by food intake. No alleviating factors identified. She has not tried OTC analgesics.  Hx of necrotizing gallstone pancreatitis in 2016, she was in the ICU. She underwent laparoscopic cystogastrostomy with pancreatic necronectomy and cholecystectomy (10/17/14) + Nausea, no vomiting for the past 2 days.  + Loose stools intermittently for the past 3 weeks, 4 stools today, non yesterday, and 3 days ago over 10 stools. She has not noted fat,mucus,or blood in stool.  + Burping, she denies chest pain or heartburn.  No fever, chills,myalgias, or skin rash. 05/29/16 she was seen at [facility] because sore throat, cough, and  Nausea. Dx with acute URI.   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 No suspicious food intake.  She was seen in the ER 06/03/16 because pain got worse.  CBC: WBC's 17.7, Neutro absolute # 12.5, and eosinophiles 1.2. Lipase 11 CMP WNL except for elevated transaminases AST 127 and ALT 74. She denies high alcohol intake or abuse.   Urine dipstick: trace of ketone, small leuk and bili.  Abdomen/pelvic CT on 06/04/16:  .Liver: Focal fatty infiltration along the falciform ligament. .Gallbladder/biliary: Cholecystectomy. No bile duct dilatation. Marland KitchenSpleen: Within normal limits. .Pancreas: Minimal peripancreatic stranding, similar compared to the prior examination. Status post pancreatic necrosectomy with changes related to prior cystogastrostomy. No peripancreatic fluid collections.  .Adrenals: Within normal limits. .Kidneys: Tiny striated pattern of enhancement involving the posterior interpolar region of the right kidney. No hydronephrosis. .Peritoneum/extraperitoneum/mesentery: A trace amount of stranding/free fluid in the anterior pararenal  space adjacent to the pancreas which extends into the root of the small bowel mesentery. Scattered nonenlarged mesenteric lymph nodes. Decreased size of the nodule along the left pericolic gutter, which measures 4 mm, previously 10 mm. This likely relates to prior inflammatory change and scarring.  .GI tract: No evidence of obstruction. Changes related to prior cystogastrostomy. Normal appendix in the right lower quadrant. .Vascular: Patent. No aneurysm.  PELVIS: .Ureters: Within normal limits. .Bladder: The urinary bladder is mostly decompressed with a small amount of stranding along the anterior wall. .Reproductive system: Intrauterine device. Low attenuation bilateral adnexal lesions, likely physiologic ovarian cysts.   Dx with viral gastroenteritis and colitis.  LMP: She has IUD [place].   Review of Systems  Constitutional: Positive for appetite change, chills and fatigue. Negative for fever.  HENT: Negative for facial swelling, mouth sores, nosebleeds, postnasal drip and trouble swallowing.   Respiratory: Negative for cough, shortness of breath and wheezing.   Cardiovascular: Negative for palpitations and leg swelling.  Gastrointestinal: Positive for abdominal pain, diarrhea and nausea. Negative for blood in stool and vomiting.  Genitourinary: Negative for decreased urine volume, dysuria and hematuria.  Musculoskeletal: Negative for gait problem and myalgias.  Skin: Negative for color change and rash.  Neurological: Negative for syncope, weakness and headaches.  Hematological: Negative for adenopathy. Does not bruise/bleed easily.  Psychiatric/Behavioral: Negative for confusion and sleep disturbance. The patient is nervous/anxious.        Current Outpatient Prescriptions on File Prior to Visit  Medication Sig Dispense Refill  . ALPRAZolam (XANAX) 0.5 MG tablet Take 0.5 mg by mouth at bedtime as needed for anxiety.    . Levonorgestrel (SKYLA IU) by Intrauterine route.       No current facility-administered medications on  file prior to visit.      Past Medical History:  Diagnosis Date  . Anxiety    diagnosed in 2012  . Contraceptive use    Had nexplanon - removed, now on OCP  . Dysmenorrhea    Allergies  Allergen Reactions  . Morphine Other (See Comments)    Respiratory rate dropped    No family history on file.  Social History   Social History  . Marital status: Single    Spouse name: N/A  . Number of children: N/A  . Years of education: N/A   Occupational History  . student    Social History Main Topics  . Smoking status: Never Smoker  . Smokeless tobacco: Never Used  . Alcohol use 0.0 oz/week     Comment: Occasionally few mixed drinks with friends  . Drug use: No  . Sexual activity: Yes   Other Topics Concern  . 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   Physical Exam  Nursing note and vitals reviewed. Constitutional: She is oriented to person, place, and time. She appears well-developed. She does not appear ill. No distress.  HENT:  Head: Atraumatic.  Mouth/Throat: Oropharynx is clear and moist. Mucous membranes are dry (mild).  Eyes: Conjunctivae and EOM are normal. Pupils are equal, round, and reactive to light.  Cardiovascular: Normal rate and regular rhythm.   No murmur heard. Pulses:      Dorsalis pedis pulses are 2+ on the right side, and 2+ on the left side.  Respiratory: Effort normal and breath sounds normal. No respiratory distress.  GI: Soft. Bowel sounds are normal. She exhibits no mass. There is no hepatomegaly. There is tenderness in the epigastric area and left upper quadrant. There is no rigidity, no rebound, no guarding and no CVA tenderness.  Musculoskeletal: She exhibits no edema or tenderness.  Lymphadenopathy:    She  has cervical adenopathy.       Right cervical: Posterior cervical adenopathy present.       Left cervical: Posterior cervical adenopathy present.       Right: No supraclavicular adenopathy present.       Left: No supraclavicular adenopathy present.  Neurological: She is alert and oriented to person, place, and time. She has normal strength. Coordination and gait normal.  Skin: Skin is warm. No rash noted. No erythema.  Psychiatric: Her mood appears anxious.  Adequately groomed, good eye contact.      ASSESSMENT AND PLAN:   [name] was seen today for establish care.  Diagnoses and all orders for this visit:  Pain of upper abdomen  We dicussed possible causes:infectious,dyspepsia/peptic disease among some. Pancreatitis was ruled out during recent ER visit with normal lipase and CT negative for signs suggestive of acute pancreatic abnormality. She has had some burping, I recommended low dose Omeprazole. Bland diet. Tramadol for pain management, some side effects discussed. Instructed about warning signs.  -     traMADol (ULTRAM) 50 MG tablet; Take 1 tablet (50 mg total) by mouth every 12 (twelve) hours as needed. -     omeprazole (PRILOSEC) 20 MG capsule; Take 1 capsule (20 mg total) by mouth daily before breakfast.  Leukocytosis, unspecified type  Suggest acute infectious process. If not improving we may  need to consider other etiologies. Further recommendations will be given according to lab result.  -     CBC with Differential/Platelet  Nausea without vomiting  Vomiting resolved. Small and frequent sips of clear fluids. Zofran to take as needed.  -     ondansetron (ZOFRAN) 4 MG tablet; Take 1 tablet (4 mg total) by mouth every 8 (eight) hours as needed for nausea or vomiting.  Elevated transaminase level  She denies alcohol abuse. We will follow next OV. 05/09/15 HBVsAg and B core Ig M ab negative. HCV negative HIV NR  Lymphadenopathy  Fatigue,recent  pharyngitis, and elevated transaminases + small cervical lymph nodes (could also be residual for recent URI); so mono test was done, negative.  -     Monospot      Betty G. [place], MD  [facility]. [place] office.

## 2016-06-05 NOTE — Patient Instructions (Addendum)
A few things to remember from today's visit:   Pain of upper abdomen - Plan: traMADol (ULTRAM) 50 MG tablet, omeprazole (PRILOSEC) 20 MG capsule  Lymphadenopathy - Plan: Monospot  Leukocytosis, unspecified type - Plan: CBC with Differential/Platelet  Nausea without vomiting - Plan: ondansetron (ZOFRAN) 4 MG tablet     Follow a bland diet for the next few days, small meals, adequate hydration.   GET HELP RIGHT AWAY IF:   The pain is does not go away within 2 hours.  Sudden severe/worsening pain.  You keep throwing up (vomiting).  The pain changes and is only in the right or left part of the belly.  Not being able to pass gas or poop.  You have bloody or tarry looking poop.   MAKE SURE YOU:   Understand these instructions.  Will watch your condition.  Will get help right away if you are not doing well or get worse.   If symptoms are persistent please arrange a follow up appointment.    We have ordered labs or studies at this visit.  It can take up to 1-2 weeks for results and processing. IF results require follow up or explanation, we will call you with instructions. Clinically stable results will be released to your [facility]. If you have not heard from us or cannot find your results in [facility] in 2 weeks please contact our office at [phone].  If you are not yet signed up for [facility], please consider signing up  Please be sure medication list is accurate. If a new problem present, please set up appointment sooner than planned today.

## 2016-06-09 ENCOUNTER — Encounter: Payer: Self-pay | Admitting: Family Medicine

## 2016-06-09 ENCOUNTER — Ambulatory Visit (INDEPENDENT_AMBULATORY_CARE_PROVIDER_SITE_OTHER): Payer: Managed Care, Other (non HMO) | Admitting: Family Medicine

## 2016-06-09 VITALS — BP 100/72 | HR 74 | Resp 12 | Ht 61.0 in | Wt 184.2 lb

## 2016-06-09 DIAGNOSIS — R1010 Upper abdominal pain, unspecified: Secondary | ICD-10-CM | POA: Diagnosis not present

## 2016-06-09 DIAGNOSIS — R740 Nonspecific elevation of levels of transaminase and lactic acid dehydrogenase [LDH]: Secondary | ICD-10-CM | POA: Diagnosis not present

## 2016-06-09 DIAGNOSIS — R7401 Elevation of levels of liver transaminase levels: Secondary | ICD-10-CM

## 2016-06-09 NOTE — Progress Notes (Signed)
HPI:   [name] Blanchard is a 26 y.o. female, who is here today to follow on recent OV.   She was seen on 06/05/16, when she was c/o moderate to severe upper abdominal pain with associated fever,nausea,and vomiting. Hx of necrotizing gallstone pancreatitis in 2016. She was seen in the ER prior to her last OV, 05/29/16, lipase was normal, elevated WBC's, improved when re-checked last OV. She was discharged on tramadol 50 mg ,Zofran,and also on Omeprazole.   Overall she is feeling better, no new symptoms reported.  Abdominal pain resolved 2 days ago. Last time she had nausea, vomiting, and diarrhea was 06/06/2016 night. She has not had chills, fevers, or body aches. She is tolerating food intake, trying to eat bland food and trying to eat healthier.  She also had elevated transaminases 06/03/16 AST 127 and ALT 74. She denies alcohol intake. Hepatitis work up done 04/2015.  No new concerns today.   Review of Systems  Constitutional: Negative for chills, fatigue and fever.  HENT: Negative for sore throat and trouble swallowing.   Respiratory: Negative for cough, shortness of breath and wheezing.   Cardiovascular: Negative for chest pain and leg swelling.  Gastrointestinal: Negative for abdominal pain, diarrhea, nausea and vomiting.  Genitourinary: Negative for decreased urine volume, dysuria and hematuria.  Musculoskeletal: Negative for back pain and myalgias.  Skin: Negative for rash.  Neurological: Negative for syncope, weakness and headaches.  Psychiatric/Behavioral: Negative for confusion and sleep disturbance. The patient is nervous/anxious.       Current Outpatient Prescriptions on File Prior to Visit  Medication Sig Dispense Refill  . ALPRAZolam (XANAX) 0.5 MG tablet Take 0.5 mg by mouth at bedtime as needed for anxiety.    . Levonorgestrel (SKYLA IU) by Intrauterine route.    Marland Kitchen omeprazole (PRILOSEC) 20 MG capsule Take 1 capsule (20 mg total) by mouth daily before  breakfast. 30 capsule 0  . ondansetron (ZOFRAN) 4 MG tablet Take 1 tablet (4 mg total) by mouth every 8 (eight) hours as needed for nausea or vomiting. 15 tablet 0  . traMADol (ULTRAM) 50 MG tablet Take 1 tablet (50 mg total) by mouth every 12 (twelve) hours as needed. 14 tablet 0   No current facility-administered medications on file prior to visit.      Past Medical History:  Diagnosis Date  . Anxiety    diagnosed in 2012  . Contraceptive use    Had nexplanon - removed, now on OCP  . Dysmenorrhea    Allergies  Allergen Reactions  . Morphine Other (See Comments)    Respiratory rate dropped    Social History   Social History  . Marital status: Single    Spouse name: N/A  . Number of children: N/A  . Years of education: N/A   Occupational History  . student    Social History Main Topics  . Smoking status: Never Smoker  . Smokeless tobacco: Never Used  . Alcohol use 0.0 oz/week     Comment: Occasionally few mixed drinks with friends  . Drug use: No  . Sexual activity: Yes   Other Topics Concern  . 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  Wt Readings from Last 3 Encounters:  06/09/16 184 lb 4 oz (83.6 kg)  06/05/16 183 lb 4 oz (83.1 kg)  12/02/15 185 lb (83.9 kg)    Physical Exam  Nursing note and vitals reviewed. Constitutional: She is oriented to person, place, and time. She appears well-developed. She does not appear ill. No distress.  HENT:  Head: Atraumatic.  Mouth/Throat: Oropharynx is clear and moist and mucous membranes are normal.  Eyes: Conjunctivae and EOM are normal.  Cardiovascular: Normal rate and regular rhythm.   No murmur heard. Respiratory: Effort normal and breath sounds normal. No respiratory distress.  GI: Soft. Bowel sounds are normal. She exhibits no mass. There is no  hepatomegaly. There is no tenderness.  Lymphadenopathy:    She has no cervical adenopathy.  Neurological: She is alert and oriented to person, place, and time. She has normal strength. Gait normal.  Skin: Skin is warm. No erythema.  Psychiatric: Her mood appears anxious.  Adequat groomed, good eye contact.      ASSESSMENT AND PLAN:    [name] Blanchard was seen today for follow-up.  Diagnoses and all orders for this visit:  Pain of upper abdomen  Resolved. ? Viral colitis. ? Dyspepsia. Recommended completing 3-4 weeks of PPI. Healthy diet, avoid GI irritants. Instructed about warning signs. If symptoms re-occur GI referral will be considered.  Elevated transaminase level  LFT's in 2-3 weeks. Avoid alcohol intake. Wt loss may also help. Low far diet.  -     Hepatic function panel; Future      Betty G. Swazil[place], MD  [facility]. [place] office.

## 2016-06-09 NOTE — Patient Instructions (Signed)
A few things to remember from today's visit:   Pain of upper abdomen  Elevated transaminase level - Plan: Hepatic function panel  Continue healthy bland diet.   Avoid foods that make your symptoms worse, for example coffee, chocolate,pepermeint,alcohol, and greasy food. Raising the head of your bed about 6 inches may help with nocturnal symptoms.  Avoid tobacco use. Weight loss (if you are overweight). Avoid lying down for 3 hours after eating.  Instead 3 large meals daily try small and more frequent meals during the day. Complete 4 weeks of Omeprazole.  You should be evaluated immediately if bloody vomiting, bloody stools, black stools (like tar), difficulty swallowing, food gets stuck on the way down or choking when eating. Abnormal weight loss or severe abdominal pain.  If symptoms are not resolved sometimes endoscopy is necessary.   Lab in 3 weeks.   Please be sure medication list is accurate. If a new problem present, please set up appointment sooner than planned today.

## 2016-06-09 NOTE — Progress Notes (Signed)
Pre visit review using our clinic review tool, if applicable. No additional management support is needed unless otherwise documented below in the visit note. 

## 2016-06-22 IMAGING — DX DG ABDOMEN 1V
1 series · 1 of 1 positions shown · non-contrast
Comparison: CT abdomen and pelvis and chest and two views abdomen.

CLINICAL DATA: Abdominal pain, vomiting and constipation for 2
days. Acute pancreatitis.

EXAM:
ABDOMEN - 1 VIEW

[abdomen supine]
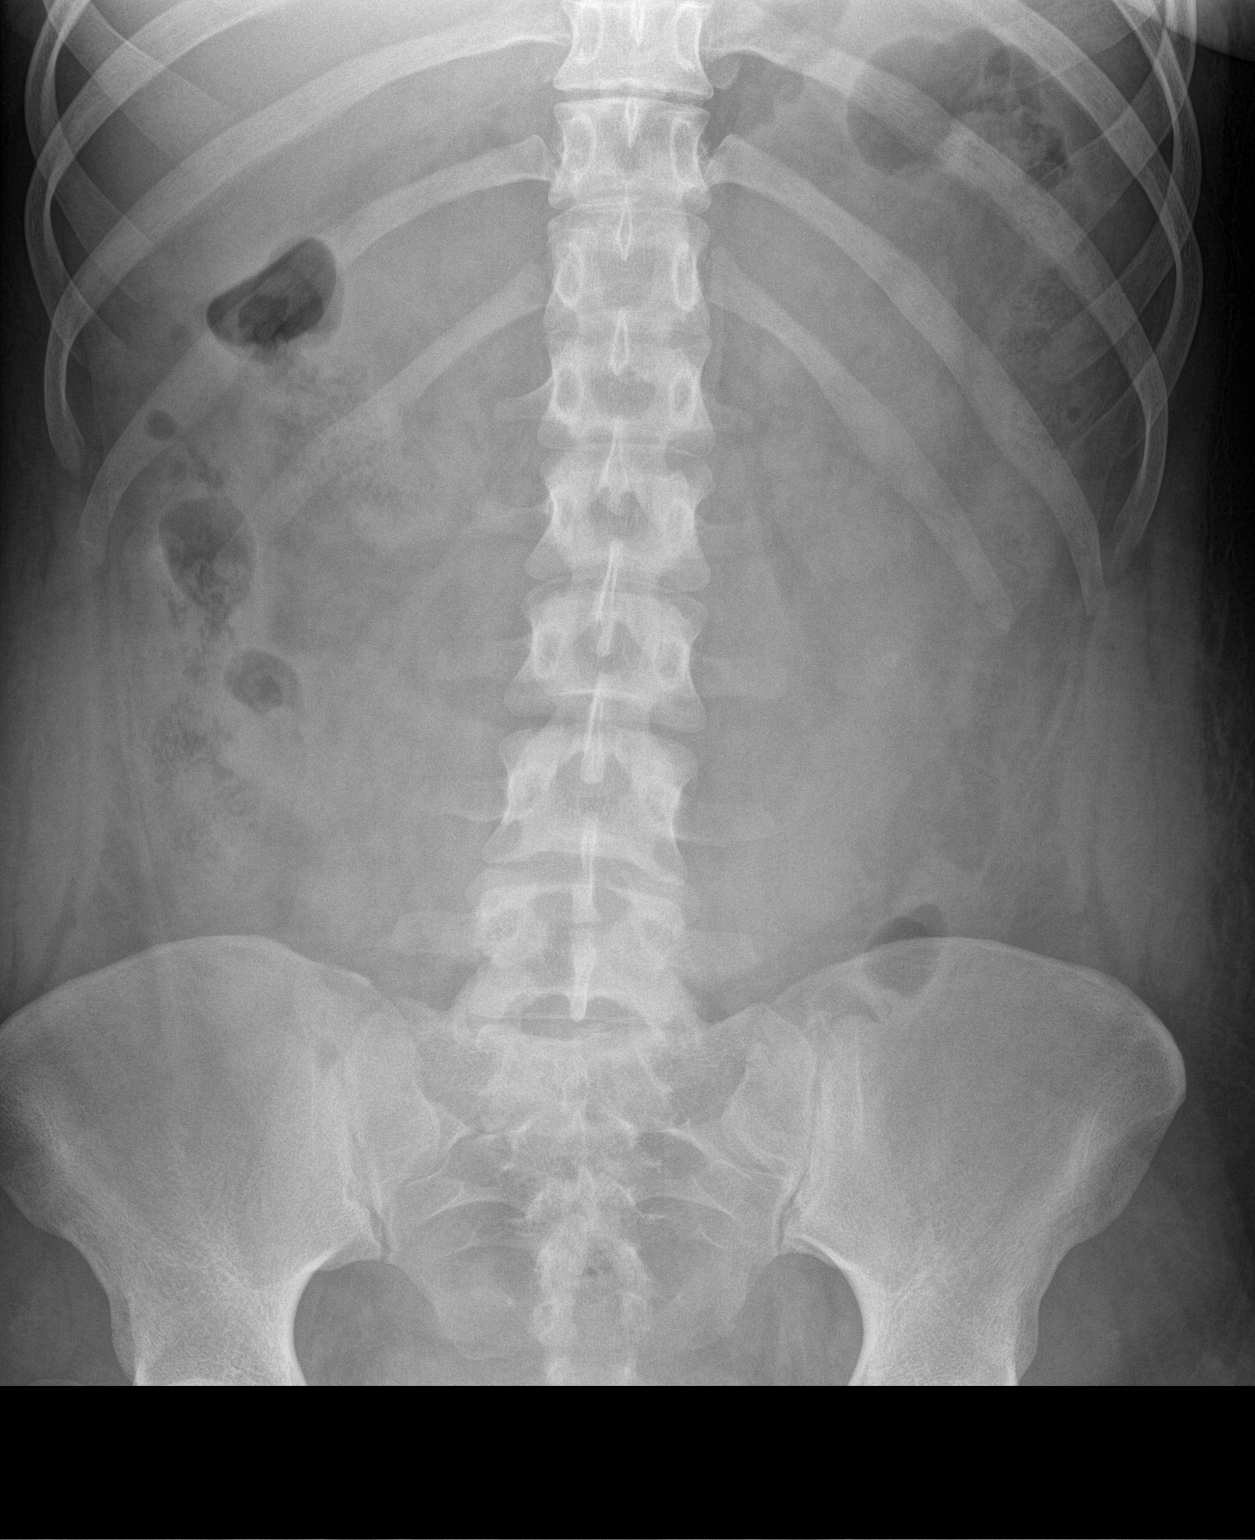

[1 of 1 positions shown; findings below may reference images not displayed]

FINDINGS: The bowel gas pattern is normal. No radio-opaque calculi or other
significant radiographic abnormality are seen.
IMPRESSION: Negative exam.

## 2016-06-23 IMAGING — NM NM HEPATOBILIARY IMAGE, INC GB
2 series · 12 of 12 positions shown · non-contrast
Comparison: CT abdomen 08/06/2014

CLINICAL DATA: Gallstones.  Nausea vomiting and pain

EXAM:
NUCLEAR MEDICINE HEPATOBILIARY IMAGING WITH GALLBLADDER EF
TECHNIQUE: Sequential images of the abdomen were obtained [DATE] minutes
following intravenous administration of radiopharmaceutical. After
slow intravenous infusion of 1.82 micrograms Cholecystokinin,
gallbladder ejection fraction was determined.
RADIOPHARMACEUTICALS:  Five Millicurie 1c-XXm Choletec

[he hepatobiliary · 3.43mm/px · 6 of 54 frames shown (1 of 2)]
[frame 5/54]
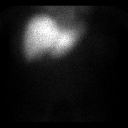
[frame 14/54]
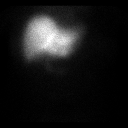
[frame 23/54]
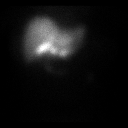
[frame 32/54]
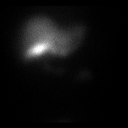
[frame 41/54]
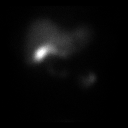
[frame 50/54]
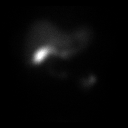

[he hepatobiliary · 3.43mm/px · 6 of 38 frames shown (2 of 2)]
[frame 4/38]
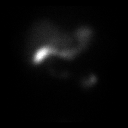
[frame 10/38]
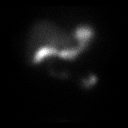
[frame 16/38]
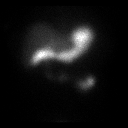
[frame 23/38]
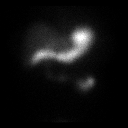
[frame 29/38]
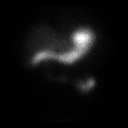
[frame 35/38]
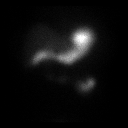

[12 of 12 positions shown; findings below may reference images not displayed]

FINDINGS: Normal uptake by the gallbladder. Normal gallbladder ejection. Small
bowel activity noted. 73% ejection fraction at 41 minutes.. At 45
min, normal ejection fraction is greater than 40%.
IMPRESSION: Normal gallbladder uptake.  Normal gallbladder ejection fraction.

## 2016-06-26 ENCOUNTER — Other Ambulatory Visit (INDEPENDENT_AMBULATORY_CARE_PROVIDER_SITE_OTHER): Payer: Managed Care, Other (non HMO)

## 2016-06-26 DIAGNOSIS — R740 Nonspecific elevation of levels of transaminase and lactic acid dehydrogenase [LDH]: Secondary | ICD-10-CM | POA: Diagnosis not present

## 2016-06-26 DIAGNOSIS — R7401 Elevation of levels of liver transaminase levels: Secondary | ICD-10-CM

## 2016-06-26 LAB — HEPATIC FUNCTION PANEL
ALK PHOS: 52 U/L (ref 39–117)
ALT: 18 U/L (ref 0–35)
AST: 18 U/L (ref 0–37)
Albumin: 4.5 g/dL (ref 3.5–5.2)
BILIRUBIN DIRECT: 0.2 mg/dL (ref 0.0–0.3)
BILIRUBIN TOTAL: 0.8 mg/dL (ref 0.2–1.2)
Total Protein: 7.6 g/dL (ref 6.0–8.3)

## 2017-06-11 DIAGNOSIS — F909 Attention-deficit hyperactivity disorder, unspecified type: Secondary | ICD-10-CM | POA: Insufficient documentation

## 2018-05-24 DIAGNOSIS — Z6834 Body mass index (BMI) 34.0-34.9, adult: Secondary | ICD-10-CM | POA: Insufficient documentation

## 2019-09-14 DIAGNOSIS — R87612 Low grade squamous intraepithelial lesion on cytologic smear of cervix (LGSIL): Secondary | ICD-10-CM | POA: Insufficient documentation

## 2020-10-16 ENCOUNTER — Ambulatory Visit: Payer: Self-pay | Admitting: Sports Medicine

## 2020-11-09 ENCOUNTER — Ambulatory Visit: Payer: Self-pay | Admitting: Sports Medicine

## 2021-02-19 LAB — OB RESULTS CONSOLE RUBELLA ANTIBODY, IGM: Rubella: IMMUNE

## 2021-02-19 LAB — OB RESULTS CONSOLE RPR: RPR: NONREACTIVE

## 2021-02-19 LAB — OB RESULTS CONSOLE ABO/RH: RH Type: POSITIVE

## 2021-02-19 LAB — OB RESULTS CONSOLE ANTIBODY SCREEN: Antibody Screen: NEGATIVE

## 2021-02-19 LAB — HEPATITIS C ANTIBODY: HCV Ab: NEGATIVE

## 2021-02-19 LAB — OB RESULTS CONSOLE HEPATITIS B SURFACE ANTIGEN: Hepatitis B Surface Ag: NEGATIVE

## 2021-02-19 LAB — OB RESULTS CONSOLE HIV ANTIBODY (ROUTINE TESTING): HIV: NONREACTIVE

## 2021-03-19 LAB — OB RESULTS CONSOLE GC/CHLAMYDIA
Chlamydia: NEGATIVE
Gonorrhea: NEGATIVE
Neisseria Gonorrhea: NEGATIVE

## 2021-07-01 ENCOUNTER — Encounter (HOSPITAL_COMMUNITY): Payer: Self-pay | Admitting: Family Medicine

## 2021-07-01 ENCOUNTER — Inpatient Hospital Stay (HOSPITAL_COMMUNITY)
Admission: AD | Admit: 2021-07-01 | Discharge: 2021-07-01 | Disposition: A | Discharge status: Home / Self Care | Payer: BC Managed Care – PPO | Attending: Obstetrics and Gynecology | Admitting: Obstetrics and Gynecology

## 2021-07-01 DIAGNOSIS — O26892 Other specified pregnancy related conditions, second trimester: Secondary | ICD-10-CM | POA: Diagnosis not present

## 2021-07-01 DIAGNOSIS — Z3689 Encounter for other specified antenatal screening: Secondary | ICD-10-CM | POA: Insufficient documentation

## 2021-07-01 DIAGNOSIS — Z8719 Personal history of other diseases of the digestive system: Secondary | ICD-10-CM | POA: Insufficient documentation

## 2021-07-01 DIAGNOSIS — R109 Unspecified abdominal pain: Secondary | ICD-10-CM | POA: Insufficient documentation

## 2021-07-01 DIAGNOSIS — R198 Other specified symptoms and signs involving the digestive system and abdomen: Secondary | ICD-10-CM | POA: Diagnosis not present

## 2021-07-01 DIAGNOSIS — O24419 Gestational diabetes mellitus in pregnancy, unspecified control: Secondary | ICD-10-CM | POA: Diagnosis not present

## 2021-07-01 DIAGNOSIS — Z3A26 26 weeks gestation of pregnancy: Secondary | ICD-10-CM | POA: Diagnosis not present

## 2021-07-01 DIAGNOSIS — O368120 Decreased fetal movements, second trimester, not applicable or unspecified: Secondary | ICD-10-CM | POA: Diagnosis not present

## 2021-07-01 HISTORY — DX: Gestational diabetes mellitus in pregnancy, unspecified control: O24.419

## 2021-07-01 HISTORY — DX: Acute pancreatitis without necrosis or infection, unspecified: K85.90

## 2021-07-01 LAB — URINALYSIS, ROUTINE W REFLEX MICROSCOPIC
Bilirubin Urine: NEGATIVE
Glucose, UA: NEGATIVE mg/dL
Hgb urine dipstick: NEGATIVE
Ketones, ur: NEGATIVE mg/dL
Leukocytes,Ua: NEGATIVE
Nitrite: NEGATIVE
Protein, ur: NEGATIVE mg/dL
Specific Gravity, Urine: 1.013 (ref 1.005–1.030)
pH: 7 (ref 5.0–8.0)

## 2021-07-01 NOTE — MAU Provider Note (Signed)
?History  ?  ? ?[id number] ? ?Arrival date and time: 07/01/21 1129 ?  ? ?Chief Complaint  ?Patient presents with  ? Decreased Fetal Movement  ? Abdominal Pain  ? ? ? ?HPI ?[name] Blanchard is a 31 y.o. at [redacted]w[redacted]d by LMP with PMHx notable for necrotizing pancreatitis and gestational diabetes, who presents for abdominal pain & decreased fetal movement. ?Woke up this morning & started feeling abdominal tightness. States felt tightening across her upper abdomen. More tight than painful. Became regular just prior to coming to MAU - she felt it happen every 3-5 minutes for nearly an hour. Denies n/v/d, dysuria, vaginal bleeding, or LOF.  ?Has noticed decreased movement for the last 5 days. Normally feels a lot of movement during the day but reports having trouble feeling 1 movement in an hour. Tried drinking coffee this morning and lying on her side but only felt baby move once.  ?Has only had coffee today; no food.  ?Goes to [facility] - has an appointment in 1 week.  ? ?OB History   ? ? Gravida  ?1  ? Para  ?0  ? Term  ?0  ? Preterm  ?0  ? AB  ?0  ? Living  ?0  ?  ? ? SAB  ?0  ? IAB  ?0  ? Ectopic  ?0  ? Multiple  ?0  ? Live Births  ?0  ?   ?  ?  ? ? ?Past Medical History:  ?Diagnosis Date  ? Anxiety   ? diagnosed in 2012  ? Dysmenorrhea   ? Gestational diabetes   ? Pancreatitis   ? necrotizing pancreatitis w/ sepsis  ? ? ?Past Surgical History:  ?Procedure Laterality Date  ? ADENOIDECTOMY    ? 31y/o  ? CHOLECYSTECTOMY    ? KNEE SURGERY Right 09/10/2010  ? Dr. Luiz Blare  ? TONSILLECTOMY    ? 31 y/o  ? WISDOM TOOTH EXTRACTION    ? unsure if 2 or 4 teeth  ? ? ?History reviewed. No pertinent family history. ? ?Allergies  ?Allergen Reactions  ? Morphine Other (See Comments)  ?  Respiratory rate dropped  ? ? ?No current facility-administered medications on file prior to encounter.  ? ?Current Outpatient Medications on File Prior to Encounter  ?Medication Sig Dispense Refill  ? Prenatal Vit-Fe Fumarate-FA (PRENATAL  MULTIVITAMIN) TABS tablet Take 1 tablet by mouth daily at 12 noon.    ? ? ? ?ROS ?Pertinent positives and negative per HPI, all others reviewed and negative ? ?Physical Exam  ? ?BP 110/70 (BP Location: Right Arm)   Pulse 90   Temp (!) 97.5 ?F (36.4 ?C) (Oral)   Resp 16   Ht 5\' 2"  (1.575 m)   Wt 95.2 kg   SpO2 100%   BMI 38.39 kg/m?  ? ?Patient Vitals for the past 24 hrs: ? BP Temp Temp src Pulse Resp SpO2 Height Weight  ?07/01/21 1237 110/70 -- -- 90 16 -- -- --  ?07/01/21 1148 125/70 (!) 97.5 ?F (36.4 ?C) Oral 86 17 100 % -- --  ?07/01/21 1141 -- -- -- -- -- -- 5\' 2"  (1.575 m) 95.2 kg  ? ? ?Physical Exam ?Vitals and nursing note reviewed. Exam conducted with a chaperone present.  ?Constitutional:   ?   General: She is not in acute distress. ?   Appearance: She is well-developed.  ?HENT:  ?   Head: Normocephalic and atraumatic.  ?Pulmonary:  ?   Effort: Pulmonary effort  is normal. No respiratory distress.  ?Neurological:  ?   Mental Status: She is alert.  ?Psychiatric:     ?   Mood and Affect: Mood normal.     ?   Behavior: Behavior normal.  ?  ? ?Cervical Exam ?Dilation: Closed ?Effacement (%): Thick ?Cervical Position: Posterior ?Station: -3 ?Exam by:: Judeth Horn NP ? ?FHT ?Baseline 145, moderate variability, 10x10 accels, no decels ?Toco: none ?Cat: 1 ? ?Labs ?Results for orders placed or performed during the hospital encounter of 07/01/21 (from the past 24 hour(s))  ?Urinalysis, Routine w reflex microscopic Urine, Clean Catch     Status: None  ? Collection Time: 07/01/21 12:00 PM  ?Result Value Ref Range  ? Color, Urine YELLOW YELLOW  ? APPearance CLEAR CLEAR  ? Specific Gravity, Urine 1.013 1.005 - 1.030  ? pH 7.0 5.0 - 8.0  ? Glucose, UA NEGATIVE NEGATIVE mg/dL  ? Hgb urine dipstick NEGATIVE NEGATIVE  ? Bilirubin Urine NEGATIVE NEGATIVE  ? Ketones, ur NEGATIVE NEGATIVE mg/dL  ? Protein, ur NEGATIVE NEGATIVE mg/dL  ? Nitrite NEGATIVE NEGATIVE  ? Leukocytes,Ua NEGATIVE NEGATIVE  ? ? ?Imaging ?No  results found. ? ?MAU Course  ?Procedures ?Lab Orders    ?     Urinalysis, Routine w reflex microscopic Urine, Clean Catch    ?No orders of the defined types were placed in this encounter. ? ?Imaging Orders  ?No imaging studies ordered today  ? ? ?MDM ?Abdominal pain: patient reports intermittent abdominal tightening. Not contracting on monitor. Negative U/a. Cervix closed/thick.  ? ?Decreased fetal movement: Category 1 tracing. Movement heard through [facility] and felt on exam. Patient reports feeling 4 movements while in MAU.  ?Assessment and Plan  ? ?1. Decreased fetal movements in second trimester, single or unspecified fetus ?-reviewed expectation of fetal movement & reasons to return to MAU   ?2. Abdominal tightness ?-Discussed signs of preterm labor  ?3. [redacted] weeks gestation of pregnancy   ? ? ? ?Judeth Horn, NP ?07/01/21 ?2:33 PM ? ? ?

## 2021-07-01 NOTE — MAU Note (Signed)
Patient reports DFM for past 5-6 days and started having some abdominal/back pain today.  Pain wraps around stomach and is intermittent. Rates pain 3-4/10.  Denies vaginal bleeding, denies LOF.  ?

## 2021-07-03 ENCOUNTER — Encounter: Payer: Self-pay | Admitting: Registered"

## 2021-07-03 ENCOUNTER — Other Ambulatory Visit: Payer: Self-pay

## 2021-07-03 ENCOUNTER — Encounter: Payer: BC Managed Care – PPO | Attending: Obstetrics and Gynecology | Admitting: Registered"

## 2021-07-03 DIAGNOSIS — O24419 Gestational diabetes mellitus in pregnancy, unspecified control: Secondary | ICD-10-CM | POA: Diagnosis present

## 2021-07-03 NOTE — Progress Notes (Signed)
Patient was seen on 07/03/21 for Gestational Diabetes self-management class at the [facility]. The following learning objectives were met by the patient during this course: ? ?States the definition of Gestational Diabetes ?States why dietary management is important in controlling blood glucose ?Describes the effects each nutrient has on blood glucose levels ?Demonstrates ability to create a balanced meal plan ?Demonstrates carbohydrate counting  ?States when to check blood glucose levels ?Demonstrates proper blood glucose monitoring techniques ?States the effect of stress and exercise on blood glucose levels ?States the importance of limiting caffeine and abstaining from alcohol and smoking ? ?Blood glucose monitor given: has meter, checking BG prior to class ? ?Patient instructed to monitor glucose levels: ?FBS: 60 - <95; 1 hour: <140; 2 hour: <120 ? ?Patient received handouts: ?Nutrition Diabetes and Pregnancy, including carb counting list ? ?Patient will be seen for follow-up as needed. ?

## 2021-09-02 LAB — OB RESULTS CONSOLE GBS: GBS: POSITIVE

## 2021-09-05 ENCOUNTER — Encounter (HOSPITAL_COMMUNITY): Payer: Self-pay | Admitting: *Deleted

## 2021-09-05 ENCOUNTER — Telehealth (HOSPITAL_COMMUNITY): Payer: Self-pay | Admitting: *Deleted

## 2021-09-05 NOTE — Telephone Encounter (Signed)
Preadmission screen  

## 2021-09-17 ENCOUNTER — Other Ambulatory Visit: Payer: Self-pay

## 2021-09-17 NOTE — H&P (Signed)
[name] Blanchard is a 31 y.o. 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 Berenice Primas   TONSILLECTOMY     31 y/o   WISDOM TOOTH EXTRACTION     unsure if 2 or 4 teeth   Family History: family history includes Diabetes in her father, maternal grandfather, maternal grandmother, paternal grandfather, and paternal grandmother; Hypertension in her father, maternal grandfather, maternal grandmother, paternal grandfather, and paternal grandmother. Social History:  reports that she has never smoked. She has never used smokeless tobacco. She reports current alcohol use. She reports that she does not use drugs.     Maternal Diabetes: Yes:  Diabetes Type:  Pre-pregnancy Genetic Screening: Normal Maternal Ultrasounds/Referrals: Normal Fetal Ultrasounds or other Referrals:  None Maternal Substance Abuse:  No Significant Maternal Medications:  None Significant Maternal Lab Results:  Group B Strep is positive Other Comments:  None  Review of Systems  All other systems reviewed and are negative. History   There were no vitals taken for this visit. Maternal Exam:  Abdomen: Fetal presentation: vertex  Physical Exam Vitals and nursing note reviewed. Exam conducted with a chaperone present.   HENT:     Head: Normocephalic.     Right Ear: Tympanic membrane normal.     Mouth/Throat:     Mouth: Mucous membranes are moist.  Eyes:     Pupils: Pupils are equal, round, and reactive to light.  Cardiovascular:     Rate and Rhythm: Normal rate and regular rhythm.  Musculoskeletal:     Cervical back: Normal range of motion.    Prenatal labs: ABO, Rh: A/Positive/-- (10/25 0000) Antibody: Negative (10/25 0000) Rubella: Immune (10/25 0000) RPR: Nonreactive (10/25 0000)  HBsAg: Negative (10/25 0000)  HIV: Non-reactive (10/25 0000)  GBS: Positive/-- (05/08 0000)   Assessment/Plan: IUP at 38 1  IOL GDM on insulin -  Group B Strep positive  Admit Cytotec Pitocin SSL during labor and post delivery Antibiotics Cyril Mourning 09/17/2021, 1:41 PM

## 2021-09-18 ENCOUNTER — Encounter (HOSPITAL_COMMUNITY)
Admission: AD | Disposition: A | Discharge status: Home / Self Care | Payer: Self-pay | Source: Home / Self Care | Attending: Obstetrics and Gynecology

## 2021-09-18 ENCOUNTER — Other Ambulatory Visit: Payer: Self-pay

## 2021-09-18 ENCOUNTER — Inpatient Hospital Stay (HOSPITAL_COMMUNITY): Payer: BC Managed Care – PPO | Admitting: Anesthesiology

## 2021-09-18 ENCOUNTER — Inpatient Hospital Stay (HOSPITAL_COMMUNITY)
Admission: AD | Admit: 2021-09-18 | Discharge: 2021-09-21 | DRG: 787 | Disposition: A | Discharge status: Home / Self Care | Payer: BC Managed Care – PPO | Attending: Obstetrics and Gynecology | Admitting: Obstetrics and Gynecology

## 2021-09-18 ENCOUNTER — Inpatient Hospital Stay (HOSPITAL_COMMUNITY): Payer: BC Managed Care – PPO

## 2021-09-18 ENCOUNTER — Encounter (HOSPITAL_COMMUNITY): Payer: Self-pay | Admitting: Obstetrics and Gynecology

## 2021-09-18 DIAGNOSIS — Z3A38 38 weeks gestation of pregnancy: Secondary | ICD-10-CM

## 2021-09-18 DIAGNOSIS — O9081 Anemia of the puerperium: Secondary | ICD-10-CM | POA: Diagnosis not present

## 2021-09-18 DIAGNOSIS — Z98891 History of uterine scar from previous surgery: Secondary | ICD-10-CM

## 2021-09-18 DIAGNOSIS — O99893 Other specified diseases and conditions complicating puerperium: Secondary | ICD-10-CM | POA: Diagnosis not present

## 2021-09-18 DIAGNOSIS — O99214 Obesity complicating childbirth: Secondary | ICD-10-CM | POA: Diagnosis present

## 2021-09-18 DIAGNOSIS — I959 Hypotension, unspecified: Secondary | ICD-10-CM | POA: Diagnosis not present

## 2021-09-18 DIAGNOSIS — O99824 Streptococcus B carrier state complicating childbirth: Secondary | ICD-10-CM | POA: Diagnosis present

## 2021-09-18 DIAGNOSIS — D62 Acute posthemorrhagic anemia: Secondary | ICD-10-CM | POA: Diagnosis not present

## 2021-09-18 DIAGNOSIS — O24424 Gestational diabetes mellitus in childbirth, insulin controlled: Secondary | ICD-10-CM | POA: Diagnosis present

## 2021-09-18 DIAGNOSIS — Z3490 Encounter for supervision of normal pregnancy, unspecified, unspecified trimester: Principal | ICD-10-CM

## 2021-09-18 LAB — CBC
HCT: 35.8 % — ABNORMAL LOW (ref 36.0–46.0)
Hemoglobin: 11.4 g/dL — ABNORMAL LOW (ref 12.0–15.0)
MCH: 27.5 pg (ref 26.0–34.0)
MCHC: 31.8 g/dL (ref 30.0–36.0)
MCV: 86.3 fL (ref 80.0–100.0)
Platelets: 228 10*3/uL (ref 150–400)
RBC: 4.15 MIL/uL (ref 3.87–5.11)
RDW: 15.1 % (ref 11.5–15.5)
WBC: 11.7 10*3/uL — ABNORMAL HIGH (ref 4.0–10.5)
nRBC: 0 % (ref 0.0–0.2)

## 2021-09-18 LAB — TYPE AND SCREEN
ABO/RH(D): A POS
Antibody Screen: NEGATIVE

## 2021-09-18 LAB — GLUCOSE, CAPILLARY
Glucose-Capillary: 94 mg/dL (ref 70–99)
Glucose-Capillary: 103 mg/dL — ABNORMAL HIGH (ref 70–99)
Glucose-Capillary: 88 mg/dL (ref 70–99)
Glucose-Capillary: 102 mg/dL — ABNORMAL HIGH (ref 70–99)

## 2021-09-18 LAB — RPR: RPR Ser Ql: NONREACTIVE

## 2021-09-18 SURGERY — Surgical Case
Anesthesia: Epidural

## 2021-09-18 MED ORDER — OXYCODONE-ACETAMINOPHEN 5-325 MG PO TABS: 2.0000 | ORAL_TABLET | ORAL | Status: DC | PRN

## 2021-09-18 MED ORDER — SCOPOLAMINE 1 MG/3DAYS TD PT72
MEDICATED_PATCH | TRANSDERMAL | Status: DC | PRN
  Administered 2021-09-18: 1 via TRANSDERMAL

## 2021-09-18 MED ORDER — LACTATED RINGERS IV SOLN
500.0000 mL | Freq: Once | INTRAVENOUS | Status: AC
  Administered 2021-09-18: 500 mL via INTRAVENOUS

## 2021-09-18 MED ORDER — TERBUTALINE SULFATE 1 MG/ML IJ SOLN: 0.2500 mg | Freq: Once | INTRAMUSCULAR | Status: DC | PRN

## 2021-09-18 MED ORDER — IBUPROFEN 600 MG PO TABS
600.0000 mg | ORAL_TABLET | Freq: Four times a day (QID) | ORAL | Status: DC
  Administered 2021-09-19 – 2021-09-21 (×9): 600 mg via ORAL
  Filled 2021-09-18 (×9): qty 1

## 2021-09-18 MED ORDER — MENTHOL 3 MG MT LOZG: 1.0000 | LOZENGE | OROMUCOSAL | Status: DC | PRN

## 2021-09-18 MED ORDER — ACETAMINOPHEN 325 MG PO TABS
650.0000 mg | ORAL_TABLET | ORAL | Status: DC | PRN
  Administered 2021-09-19: 650 mg via ORAL
  Filled 2021-09-18: qty 2

## 2021-09-18 MED ORDER — DEXAMETHASONE SODIUM PHOSPHATE 4 MG/ML IJ SOLN
INTRAMUSCULAR | Status: AC
  Filled 2021-09-18: qty 1

## 2021-09-18 MED ORDER — ONDANSETRON HCL 4 MG/2ML IJ SOLN: 4.0000 mg | Freq: Four times a day (QID) | INTRAMUSCULAR | Status: DC | PRN

## 2021-09-18 MED ORDER — INSULIN ASPART 100 UNIT/ML IJ SOLN
0.0000 [IU] | INTRAMUSCULAR | Status: DC
  Administered 2021-09-19 (×2): 2 [IU] via SUBCUTANEOUS

## 2021-09-18 MED ORDER — FENTANYL CITRATE (PF) 100 MCG/2ML IJ SOLN: 25.0000 ug | INTRAMUSCULAR | Status: DC | PRN

## 2021-09-18 MED ORDER — OXYCODONE-ACETAMINOPHEN 5-325 MG PO TABS: 1.0000 | ORAL_TABLET | ORAL | Status: DC | PRN

## 2021-09-18 MED ORDER — ONDANSETRON HCL 4 MG/2ML IJ SOLN
INTRAMUSCULAR | Status: AC
Start: 2021-09-18 — End: ?
  Filled 2021-09-18: qty 2

## 2021-09-18 MED ORDER — DIPHENHYDRAMINE HCL 50 MG/ML IJ SOLN: 12.5000 mg | INTRAMUSCULAR | Status: DC | PRN

## 2021-09-18 MED ORDER — PHENYLEPHRINE 80 MCG/ML (10ML) SYRINGE FOR IV PUSH (FOR BLOOD PRESSURE SUPPORT): 80.0000 ug | PREFILLED_SYRINGE | INTRAVENOUS | Status: DC | PRN

## 2021-09-18 MED ORDER — OXYTOCIN-SODIUM CHLORIDE 30-0.9 UT/500ML-% IV SOLN
INTRAVENOUS | Status: DC | PRN
Start: 2021-09-18 — End: 2021-09-18
  Administered 2021-09-18: 300 mL via INTRAVENOUS

## 2021-09-18 MED ORDER — OXYTOCIN-SODIUM CHLORIDE 30-0.9 UT/500ML-% IV SOLN
2.5000 [IU]/h | INTRAVENOUS | Status: DC
  Administered 2021-09-18: 2.5 [IU]/h via INTRAVENOUS

## 2021-09-18 MED ORDER — ONDANSETRON HCL 4 MG/2ML IJ SOLN
INTRAMUSCULAR | Status: DC | PRN
  Administered 2021-09-18: 4 mg via INTRAVENOUS

## 2021-09-18 MED ORDER — LACTATED RINGERS IV SOLN
INTRAVENOUS | Status: DC
  Administered 2021-09-18 (×2): 1000 mL via INTRAVENOUS

## 2021-09-18 MED ORDER — WITCH HAZEL-GLYCERIN EX PADS: 1.0000 "application " | MEDICATED_PAD | CUTANEOUS | Status: DC | PRN

## 2021-09-18 MED ORDER — COCONUT OIL OIL: 1.0000 "application " | TOPICAL_OIL | Status: DC | PRN

## 2021-09-18 MED ORDER — EPHEDRINE 5 MG/ML INJ: 10.0000 mg | INTRAVENOUS | Status: DC | PRN

## 2021-09-18 MED ORDER — FENTANYL CITRATE (PF) 100 MCG/2ML IJ SOLN
INTRAMUSCULAR | Status: AC
  Filled 2021-09-18: qty 2

## 2021-09-18 MED ORDER — LIDOCAINE HCL (PF) 1 % IJ SOLN
INTRAMUSCULAR | Status: DC | PRN
Start: 2021-09-18 — End: 2021-09-18
  Administered 2021-09-18: 3 mL via EPIDURAL

## 2021-09-18 MED ORDER — LIDOCAINE HCL (PF) 1 % IJ SOLN: 30.0000 mL | INTRAMUSCULAR | Status: DC | PRN

## 2021-09-18 MED ORDER — OXYTOCIN-SODIUM CHLORIDE 30-0.9 UT/500ML-% IV SOLN
2.5000 [IU]/h | INTRAVENOUS | Status: AC
  Administered 2021-09-19: 2.5 [IU]/h via INTRAVENOUS
  Filled 2021-09-18: qty 500

## 2021-09-18 MED ORDER — LACTATED RINGERS IV SOLN
INTRAVENOUS | Status: DC | PRN
Start: 2021-09-18 — End: 2021-09-18

## 2021-09-18 MED ORDER — LIDOCAINE-EPINEPHRINE (PF) 2 %-1:200000 IJ SOLN
INTRAMUSCULAR | Status: DC | PRN
  Administered 2021-09-18 (×2): 5 mL via EPIDURAL

## 2021-09-18 MED ORDER — SIMETHICONE 80 MG PO CHEW
80.0000 mg | CHEWABLE_TABLET | Freq: Three times a day (TID) | ORAL | Status: DC
  Administered 2021-09-19 – 2021-09-21 (×7): 80 mg via ORAL
  Filled 2021-09-18 (×7): qty 1

## 2021-09-18 MED ORDER — FENTANYL CITRATE (PF) 100 MCG/2ML IJ SOLN
INTRAMUSCULAR | Status: DC | PRN
  Administered 2021-09-18: 100 ug via EPIDURAL

## 2021-09-18 MED ORDER — ACETAMINOPHEN 325 MG PO TABS
650.0000 mg | ORAL_TABLET | ORAL | Status: DC | PRN
  Administered 2021-09-18: 650 mg via ORAL
  Filled 2021-09-18: qty 2

## 2021-09-18 MED ORDER — FENTANYL-BUPIVACAINE-NACL 0.5-0.125-0.9 MG/250ML-% EP SOLN
12.0000 mL/h | EPIDURAL | Status: DC | PRN
  Administered 2021-09-18: 12 mL/h via EPIDURAL
  Filled 2021-09-18: qty 250

## 2021-09-18 MED ORDER — TETANUS-DIPHTH-ACELL PERTUSSIS 5-2.5-18.5 LF-MCG/0.5 IM SUSY: 0.5000 mL | PREFILLED_SYRINGE | Freq: Once | INTRAMUSCULAR | Status: DC

## 2021-09-18 MED ORDER — OXYTOCIN BOLUS FROM INFUSION: 333.0000 mL | Freq: Once | INTRAVENOUS | Status: DC

## 2021-09-18 MED ORDER — DEXAMETHASONE SODIUM PHOSPHATE 10 MG/ML IJ SOLN
INTRAMUSCULAR | Status: DC | PRN
Start: 2021-09-18 — End: 2021-09-18
  Administered 2021-09-18: 4 mg via INTRAVENOUS

## 2021-09-18 MED ORDER — SODIUM CHLORIDE 0.9 % IV SOLN
1.0000 g | Freq: Four times a day (QID) | INTRAVENOUS | Status: DC
  Administered 2021-09-18 (×4): 1 g via INTRAVENOUS
  Filled 2021-09-18 (×6): qty 1000

## 2021-09-18 MED ORDER — SIMETHICONE 80 MG PO CHEW
80.0000 mg | CHEWABLE_TABLET | ORAL | Status: DC | PRN
  Filled 2021-09-18: qty 1

## 2021-09-18 MED ORDER — DIPHENHYDRAMINE HCL 25 MG PO CAPS: 25.0000 mg | ORAL_CAPSULE | Freq: Four times a day (QID) | ORAL | Status: DC | PRN

## 2021-09-18 MED ORDER — INSULIN ASPART 100 UNIT/ML IJ SOLN: 0.0000 [IU] | Freq: Three times a day (TID) | INTRAMUSCULAR | Status: DC

## 2021-09-18 MED ORDER — BUPIVACAINE HCL (PF) 0.25 % IJ SOLN
INTRAMUSCULAR | Status: AC
  Filled 2021-09-18: qty 30

## 2021-09-18 MED ORDER — DIBUCAINE (PERIANAL) 1 % EX OINT: 1.0000 "application " | TOPICAL_OINTMENT | CUTANEOUS | Status: DC | PRN

## 2021-09-18 MED ORDER — OXYTOCIN-SODIUM CHLORIDE 30-0.9 UT/500ML-% IV SOLN
INTRAVENOUS | Status: AC
  Filled 2021-09-18: qty 500

## 2021-09-18 MED ORDER — DEXTROSE 5 % IV SOLN
INTRAVENOUS | Status: DC | PRN
  Administered 2021-09-18: 3 g via INTRAVENOUS

## 2021-09-18 MED ORDER — AMISULPRIDE (ANTIEMETIC) 5 MG/2ML IV SOLN: 10.0000 mg | Freq: Once | INTRAVENOUS | Status: DC | PRN

## 2021-09-18 MED ORDER — SENNOSIDES-DOCUSATE SODIUM 8.6-50 MG PO TABS
2.0000 | ORAL_TABLET | Freq: Every day | ORAL | Status: DC
  Administered 2021-09-19 – 2021-09-21 (×3): 2 via ORAL
  Filled 2021-09-18 (×3): qty 2

## 2021-09-18 MED ORDER — OXYTOCIN-SODIUM CHLORIDE 30-0.9 UT/500ML-% IV SOLN
1.0000 m[IU]/min | INTRAVENOUS | Status: DC
  Administered 2021-09-18: 2 m[IU]/min via INTRAVENOUS
  Filled 2021-09-18: qty 500

## 2021-09-18 MED ORDER — SOD CITRATE-CITRIC ACID 500-334 MG/5ML PO SOLN
30.0000 mL | ORAL | Status: DC | PRN
  Administered 2021-09-18: 30 mL via ORAL
  Filled 2021-09-18: qty 30

## 2021-09-18 MED ORDER — OXYCODONE HCL 5 MG PO TABS
5.0000 mg | ORAL_TABLET | ORAL | Status: DC | PRN
  Administered 2021-09-19 – 2021-09-20 (×4): 10 mg via ORAL
  Administered 2021-09-20: 5 mg via ORAL
  Filled 2021-09-18: qty 1
  Filled 2021-09-18 (×4): qty 2

## 2021-09-18 MED ORDER — PRENATAL MULTIVITAMIN CH
1.0000 | ORAL_TABLET | Freq: Every day | ORAL | Status: DC
  Administered 2021-09-19: 1 via ORAL
  Filled 2021-09-18: qty 1

## 2021-09-18 MED ORDER — MISOPROSTOL 25 MCG QUARTER TABLET
25.0000 ug | ORAL_TABLET | ORAL | Status: DC | PRN
  Administered 2021-09-18 (×2): 25 ug via VAGINAL
  Filled 2021-09-18 (×2): qty 1

## 2021-09-18 MED ORDER — LACTATED RINGERS IV SOLN
500.0000 mL | INTRAVENOUS | Status: DC | PRN
  Administered 2021-09-18 (×2): 500 mL via INTRAVENOUS

## 2021-09-18 MED ORDER — LIDOCAINE-EPINEPHRINE (PF) 1.5 %-1:200000 IJ SOLN
INTRAMUSCULAR | Status: DC | PRN
  Administered 2021-09-18: 3 mL via EPIDURAL

## 2021-09-18 MED ORDER — ZOLPIDEM TARTRATE 5 MG PO TABS: 5.0000 mg | ORAL_TABLET | Freq: Every evening | ORAL | Status: DC | PRN

## 2021-09-18 MED ORDER — SCOPOLAMINE 1 MG/3DAYS TD PT72
MEDICATED_PATCH | TRANSDERMAL | Status: AC
  Filled 2021-09-18: qty 1

## 2021-09-18 SURGICAL SUPPLY — 35 items
APL SKNCLS STERI-STRIP NONHPOA (GAUZE/BANDAGES/DRESSINGS) ×1
BARRIER ADHS 3X4 INTERCEED (GAUZE/BANDAGES/DRESSINGS) IMPLANT
BENZOIN TINCTURE PRP APPL 2/3 (GAUZE/BANDAGES/DRESSINGS) ×1 IMPLANT
BRR ADH 4X3 ABS CNTRL BYND (GAUZE/BANDAGES/DRESSINGS)
CHLORAPREP W/TINT 26ML (MISCELLANEOUS) ×4 IMPLANT
CLAMP CORD UMBIL (MISCELLANEOUS) ×2 IMPLANT
CLOSURE STERI STRIP 1/2 X4 (GAUZE/BANDAGES/DRESSINGS) ×1 IMPLANT
CLOTH BEACON ORANGE TIMEOUT ST (SAFETY) ×2 IMPLANT
DRSG OPSITE POSTOP 4X10 (GAUZE/BANDAGES/DRESSINGS) ×2 IMPLANT
ELECT REM PT RETURN 9FT ADLT (ELECTROSURGICAL) ×2
ELECTRODE REM PT RTRN 9FT ADLT (ELECTROSURGICAL) ×1 IMPLANT
EXTRACTOR VACUUM M CUP 4 TUBE (SUCTIONS) IMPLANT
GLOVE BIO SURGEON STRL SZ 6.5 (GLOVE) ×2 IMPLANT
GLOVE BIOGEL PI IND STRL 7.0 (GLOVE) ×1 IMPLANT
GLOVE BIOGEL PI INDICATOR 7.0 (GLOVE) ×1
GOWN STRL REUS W/TWL LRG LVL3 (GOWN DISPOSABLE) ×4 IMPLANT
HEMOSTAT ARISTA ABSORB 3G PWDR (HEMOSTASIS) ×1 IMPLANT
KIT ABG SYR 3ML LUER SLIP (SYRINGE) IMPLANT
NDL HYPO 25X5/8 SAFETYGLIDE (NEEDLE) ×1 IMPLANT
NEEDLE HYPO 22GX1.5 SAFETY (NEEDLE) IMPLANT
NEEDLE HYPO 25X5/8 SAFETYGLIDE (NEEDLE) ×2 IMPLANT
NS IRRIG 1000ML POUR BTL (IV SOLUTION) ×2 IMPLANT
PACK C SECTION WH (CUSTOM PROCEDURE TRAY) ×2 IMPLANT
PAD OB MATERNITY 4.3X12.25 (PERSONAL CARE ITEMS) ×2 IMPLANT
SUT CHROMIC 0 CTX 36 (SUTURE) ×5 IMPLANT
SUT PLAIN 0 NONE (SUTURE) IMPLANT
SUT PLAIN 2 0 XLH (SUTURE) IMPLANT
SUT VIC AB 0 CT1 27 (SUTURE) ×8
SUT VIC AB 0 CT1 27XBRD ANBCTR (SUTURE) ×3 IMPLANT
SUT VIC AB 4-0 KS 27 (SUTURE) IMPLANT
SYR CONTROL 10ML LL (SYRINGE) IMPLANT
TOWEL OR 17X24 6PK STRL BLUE (TOWEL DISPOSABLE) ×2 IMPLANT
TRAY FOLEY W/BAG SLVR 14FR LF (SET/KITS/TRAYS/PACK) ×2 IMPLANT
VACUUM CUP M-STYLE MYSTIC II (SUCTIONS) IMPLANT
WATER STERILE IRR 1000ML POUR (IV SOLUTION) ×3 IMPLANT

## 2021-09-18 NOTE — Anesthesia Preprocedure Evaluation (Addendum)
Anesthesia Evaluation  Patient identified by MRN, date of birth, ID band Patient awake    Reviewed: Allergy & Precautions, Patient's Chart, lab work & pertinent test results  Airway Mallampati: III  TM Distance: >3 FB Neck ROM: Full    Dental no notable dental hx.    Pulmonary neg pulmonary ROS,    Pulmonary exam normal breath sounds clear to auscultation       Cardiovascular negative cardio ROS Normal cardiovascular exam Rhythm:Regular Rate:Normal     Neuro/Psych negative neurological ROS     GI/Hepatic negative GI ROS, Neg liver ROS,   Endo/Other  diabetesMorbid obesity  Renal/GU negative Renal ROS     Musculoskeletal negative musculoskeletal ROS (+)   Abdominal (+) + obese,   Peds  Hematology negative hematology ROS (+)   Anesthesia Other Findings   Reproductive/Obstetrics (+) Pregnancy                             Anesthesia Physical Anesthesia Plan  ASA: 3  Anesthesia Plan: Epidural   Post-op Pain Management:    Induction:   PONV Risk Score and Plan: 2 and Dexamethasone, Ondansetron and Treatment may vary due to age or medical condition  Airway Management Planned: Natural Airway  Additional Equipment:   Intra-op Plan:   Post-operative Plan:   Informed Consent: I have reviewed the patients History and Physical, chart, labs and discussed the procedure including the risks, benefits and alternatives for the proposed anesthesia with the patient or authorized representative who has indicated his/her understanding and acceptance.       Plan Discussed with:   Anesthesia Plan Comments:        Anesthesia Quick Evaluation

## 2021-09-18 NOTE — Plan of Care (Signed)

## 2021-09-18 NOTE — Transfer of Care (Signed)
Immediate Anesthesia Transfer of Care Note  Patient: [name] Blanchard  Procedure(s) Performed: CESAREAN SECTION  Patient Location: PACU  Anesthesia Type:Epidural  Level of Consciousness: awake  Airway & Oxygen Therapy: Patient Spontanous Breathing  Post-op Assessment: Report given to RN and Post -op Vital signs reviewed and stable  Post vital signs: Reviewed and stable  Last Vitals:  Vitals Value Taken Time  BP    Temp    Pulse    Resp    SpO2      Last Pain:  Vitals:   09/18/21 1930  TempSrc: Axillary  PainSc: 0-No pain         Complications: No notable events documented.

## 2021-09-18 NOTE — Brief Op Note (Signed)
09/18/2021  9:40 PM  PATIENT:  [name] Blanchard  31 y.o. female  PRE-OPERATIVE DIAGNOSIS:  IUP at 40 w 1 day A2DM Failure to Progress  POST-OPERATIVE DIAGNOSIS:  Same  PROCEDURE:  Procedure(s): CESAREAN SECTION (N/A)  SURGEON:  Surgeon(s) and Role:    * Marcelle Overlie, MD - Primary  PHYSICIAN ASSISTANT:   ASSISTANTS: none   ANESTHESIA:   epidural  EBL:  500 mL   BLOOD ADMINISTERED:none  DRAINS: Urinary Catheter (Foley)   LOCAL MEDICATIONS USED:  NONE  SPECIMEN:  No Specimen  DISPOSITION OF SPECIMEN:  N/A  COUNTS:  YES  TOURNIQUET:  * No tourniquets in log *  DICTATION: .Other Dictation: Dictation Number dictated  PLAN OF CARE: Admit to inpatient   PATIENT DISPOSITION:  PACU - hemodynamically stable.   Delay start of Pharmacological VTE agent (>24hrs) due to surgical blood loss or risk of bleeding: not applicable

## 2021-09-18 NOTE — Progress Notes (Signed)
Received cytotec FHR Category 1 Cervix is 80% 2 cm -2 vertex AROM clear fluid Start pitocin Epidural prn Follow labor curve

## 2021-09-18 NOTE — Anesthesia Procedure Notes (Signed)
Epidural Patient location during procedure: OB Start time: 09/18/2021 10:53 AM End time: 09/18/2021 11:13 AM  Staffing Anesthesiologist: Lewie Loron, MD Performed: anesthesiologist   Preanesthetic Checklist Completed: patient identified, IV checked, risks and benefits discussed, monitors and equipment checked, pre-op evaluation and timeout performed  Epidural Patient position: sitting Prep: DuraPrep and site prepped and draped Patient monitoring: heart rate, continuous pulse ox and blood pressure Approach: midline Location: L3-L4 Injection technique: LOR air and LOR saline  Needle:  Needle type: Tuohy  Needle gauge: 17 G Needle length: 9 cm Needle insertion depth: 6 cm Catheter type: closed end flexible Catheter size: 19 Gauge Catheter at skin depth: 12 cm Test dose: negative  Assessment Sensory level: T8 Events: blood not aspirated, injection not painful, no injection resistance, no paresthesia and negative IV test  Additional Notes Reason for block:procedure for pain

## 2021-09-18 NOTE — Progress Notes (Signed)
Patient has been in good labor pattern most of the day  Cervix at 130 pm 4.5 cm dilated IUPC placed [place] units are good Repeat exam now unchanged vertex still high/ballotable Recommend proceed with primary LTCS for failure to progress Risks reviewed Consent signed Plan Sliding scale insulin post partum

## 2021-09-19 ENCOUNTER — Encounter (HOSPITAL_COMMUNITY): Payer: Self-pay | Admitting: Obstetrics and Gynecology

## 2021-09-19 LAB — CBC
HCT: 29.1 % — ABNORMAL LOW (ref 36.0–46.0)
Hemoglobin: 9.8 g/dL — ABNORMAL LOW (ref 12.0–15.0)
MCH: 28.6 pg (ref 26.0–34.0)
MCHC: 33.7 g/dL (ref 30.0–36.0)
MCV: 84.8 fL (ref 80.0–100.0)
Platelets: 194 10*3/uL (ref 150–400)
RBC: 3.43 MIL/uL — ABNORMAL LOW (ref 3.87–5.11)
RDW: 15.1 % (ref 11.5–15.5)
WBC: 18.3 10*3/uL — ABNORMAL HIGH (ref 4.0–10.5)
nRBC: 0 % (ref 0.0–0.2)

## 2021-09-19 LAB — GLUCOSE, CAPILLARY
Glucose-Capillary: 114 mg/dL — ABNORMAL HIGH (ref 70–99)
Glucose-Capillary: 124 mg/dL — ABNORMAL HIGH (ref 70–99)
Glucose-Capillary: 153 mg/dL — ABNORMAL HIGH (ref 70–99)

## 2021-09-19 MED ORDER — ACETAMINOPHEN 500 MG PO TABS
1000.0000 mg | ORAL_TABLET | Freq: Four times a day (QID) | ORAL | Status: DC
  Administered 2021-09-19 – 2021-09-21 (×8): 1000 mg via ORAL
  Filled 2021-09-19 (×9): qty 2

## 2021-09-19 MED ORDER — METFORMIN HCL 500 MG PO TABS
500.0000 mg | ORAL_TABLET | Freq: Two times a day (BID) | ORAL | Status: DC
  Administered 2021-09-19 – 2021-09-21 (×4): 500 mg via ORAL
  Filled 2021-09-19 (×4): qty 1

## 2021-09-19 MED ORDER — HYDROMORPHONE HCL 1 MG/ML IJ SOLN
1.0000 mg | Freq: Once | INTRAMUSCULAR | Status: AC
  Administered 2021-09-19: 1 mg via INTRAVENOUS
  Filled 2021-09-19: qty 1

## 2021-09-19 MED ORDER — LACTATED RINGERS IV BOLUS
1000.0000 mL | Freq: Once | INTRAVENOUS | Status: AC
  Administered 2021-09-19: 1000 mL via INTRAVENOUS

## 2021-09-19 NOTE — Progress Notes (Signed)
Called Dr. Elon Spanner to inform her of soft BPs in 80s/40s, pt with HR 58-60 and currently with of urine in foley bag. Received verbal order for 1L LR bolus with BP check in 1 hour. Also informed MD that honeycomb portion of pt's dressing covered with drainage; received verbal order to change dressing.

## 2021-09-19 NOTE — Progress Notes (Addendum)
Inpatient Diabetes Program Recommendations  AACE/ADA: New Consensus Statement on Inpatient Glycemic Control (2015)  Target Ranges:  Prepandial:   less than 140 mg/dL      Peak postprandial:   less than 180 mg/dL (1-2 hours)      Critically ill patients:  140 - 180 mg/dL   Lab Results  Component Value Date   GLUCAP 153 (H) 09/19/2021   HGBA1C 5.5 02/18/2016    Review of Glycemic Control  Latest Reference Range & Units 09/18/21 22:37 09/19/21 00:52 09/19/21 06:09  Glucose-Capillary 70 - 99 mg/dL 433 (H) 295 (H) 188 (H)  (H): Data is abnormally high Diabetes history: GDM Home: Humulin N 4 units QA, Humalog SSI TID Current orders for Inpatient glycemic control: Novolog 0-24 units Q4H Decadron 4 mg x 1   Inpatient Diabetes Program Recommendations:    Noted consult. Patient is followed by Dr Talmage Nap for outpatient endocrinology. Anticipate insulin to be more elevated today due to intraop steroid administration.  Patient not requiring oral agents prior to pregnancy. Would recommend a follow up at 6-12 weeks with GTT or A1C.  Will plan to see.   Addendum: Spoke with patient regarding outpatient diabetes management. Patient was placed on insulin in second trimester once CBGs exceeded 200's mg/dL. Has a history of necrotizing pancreatitis. Was followed b Dr Talmage Nap outpatient endocrinology during pregnancy.  Discussed current trends, patho of DM/GDM in postpartum period, changes in hormonal fluctuation impacting insulin resistance, benefits of Metformin postpartum, CGM, when to contact provider, risk of hypoglycemia especially in lactating women, impact of steroids intraop, vascular changes, commorbidities. Patient plans to follow up with Dr Talmage Nap in two weeks. Has additional order for [facility] and reviewed recommended frequency. Patient is in agreement.   -Plan to use CGM while inpatient -Metformin 500 mg BID -Novolog 0-6 units TID  Discussed plan of care with Dr Elon Spanner and RN. Orders  received and updated.    Thanks, Lujean Rave, MSN, RNC-OB Diabetes Coordinator [phone] (8a-5p)

## 2021-09-19 NOTE — Progress Notes (Signed)
Subjective: Postpartum Day 1: Cesarean Delivery Patient reports feeling okay overall - pain is a little bit better now. She has her foley still in and has not yet ambulated. She is sipping water but not drinking a lot yet.  Denies N/V/dizziness.   Objective: Vital signs in last 24 hours: Temp:  [97.6 F (36.4 C)-99.1 F (37.3 C)] 97.6 F (36.4 C) (05/25 0920) Pulse Rate:  [60-132] 60 (05/25 0920) Resp:  [16-23] 19 (05/25 0920) BP: (86-135)/(44-94) 89/49 (05/25 0920) SpO2:  [96 %-100 %] 98 % (05/25 0920)  Physical Exam:  General: alert and cooperative Lochia: appropriate Uterine Fundus: firm, appropriately tender. Abdomen is non-distended Incision: healing well, some strike-through on bandage, will change DVT Evaluation: No evidence of DVT seen on physical exam. Negative Homan's sign. No cords or calf tenderness.  Recent Labs    09/18/21 0045 09/19/21 0433  HGB 11.4* 9.8*  HCT 35.8* 29.1*    Assessment/Plan: Status post Cesarean section. Doing well postoperatively.  Schedule tylenol 1000mg  (prev prn 650mg  and only had one dose total at 0330).  Continue scheduled ibuprofen and prn oxycodone. Gave one time dose of 1mg  of dilaudid.  Exam is benign. Some soft Bps but non tachycardic and exam benign.  UOP sufficient but on low end. Suspect she is dry. Will give an IVF bolus (had IVF ordered but was not running). BS all < 200 on SSI. Fasting was 124 today. Will have DM coordinator consult today to adjust SSI if needed.  Baby boy not yet ready for circ.    Khylon Davies 09/19/2021, 9:30 AM

## 2021-09-19 NOTE — Progress Notes (Signed)
Pain now at a 2/10.  Feeling better BP still on lower end.  UOP now excellent after IVF bolus. Continue current care.

## 2021-09-19 NOTE — Lactation Note (Signed)
This note was copied from a baby's chart. Lactation Consultation Note  Patient Name: [name] Blanchard [id number] Date: 09/19/2021   Age:30 hours RN will ask mom if she wants to be seen by [place] when she does mom and infant next assessment.  Maternal Data    Feeding    LATCH Score Latch: Grasps breast easily, tongue down, lips flanged, rhythmical sucking.  Audible Swallowing: A few with stimulation  Type of Nipple: Everted at rest and after stimulation  Comfort (Breast/Nipple): Soft / non-tender  Hold (Positioning): Full assist, staff holds infant at breast  [facility] Score: 7   Lactation Tools Discussed/Used    Interventions    Discharge    Consult Status      [name] Blanchard 09/19/2021, 12:55 AM

## 2021-09-19 NOTE — Lactation Note (Signed)
This note was copied from a baby's chart. Lactation Consultation Note  Patient Name: [name] Blanchard [id number] Date: 09/19/2021 Reason for consult: Initial assessment;Mother's request;1st time breastfeeding Age:30 hours P1, ETI female infant. Mom attempted to latch infant on her right breast using the football hold position, infant did not latch at this time. LC observed infant has stool that she changed, while changing stool infant had a large emesis ( mucus).  Mom was taught hand expression by [facility] using breast model, mom self expressed infant was given 3 mls of colostrum by spoon. Mom was doing skin to skin with infant when [facility] left the room. Mom knows to continue to BF infant on demand, by cues, 8 to 12+ or more times within 24 hours, skin to skin. Mom knows to ask RN/LC for further latch assistance if needed, mom knows how to hand express and give infant back her EBM if infant doesn't latch at the breast. Mom made aware of O/P services, breastfeeding support groups, community resources, and our phone # for post-discharge questions.   Maternal Data Has patient been taught Hand Expression?: Yes Does the patient have breastfeeding experience prior to this delivery?: No  Feeding Mother's Current Feeding Choice: Breast Milk  LATCH Score Latch: Too sleepy or reluctant, no latch achieved, no sucking elicited.  Audible Swallowing: None  Type of Nipple: Everted at rest and after stimulation  Comfort (Breast/Nipple): Soft / non-tender  Hold (Positioning): Assistance needed to correctly position infant at breast and maintain latch.  LATCH Score: 5   Lactation Tools Discussed/Used    Interventions Interventions: Breast feeding basics reviewed;Assisted with latch;Skin to skin;Breast compression;Hand express;Adjust position;Support pillows;Position options;Expressed milk;Education;LC Services brochure  Discharge    Consult Status Consult Status: Follow-up Date: 09/19/21 Follow-up  type: In-patient    [name] 09/19/2021, 2:26 AM

## 2021-09-19 NOTE — Anesthesia Postprocedure Evaluation (Signed)
Anesthesia Post Note  Patient: [name]  Procedure(s) Performed: [place]     Patient location during evaluation: PACU Anesthesia Type: Epidural Level of consciousness: awake and alert Pain management: pain level controlled Vital Signs Assessment: post-procedure vital signs reviewed and stable Respiratory status: spontaneous breathing and respiratory function stable Cardiovascular status: blood pressure returned to baseline and stable Postop Assessment: epidural receding Anesthetic complications: no   No notable events documented.  Last Vitals:  Vitals:   09/19/21 0026 09/19/21 0300  BP: (!) 92/53 113/60  Pulse: 70 67  Resp: 17 18  Temp: 37.3 C 36.7 C  SpO2: 96% 98%    Last Pain:  Vitals:   09/19/21 0451  TempSrc:   PainSc: 8    Pain Goal:                   [name] Blanchard

## 2021-09-19 NOTE — Progress Notes (Signed)
MOB was referred for history of depression/anxiety. * Referral screened out by Clinical Social Worker because none of the following criteria appear to apply: ~ History of anxiety/depression during this pregnancy, or of post-partum depression. ~ Diagnosis of anxiety and/or depression within last 3 years. Per MOB's chart, MOB was dx around 2012. No concerns were noted in OB records.  OR * MOB's symptoms currently being treated with medication and/or therapy.  MOB's Edinburgh Score was 8.   Please contact the Clinical Social Worker if needs arise, or if MOB requests.   Blaine Hamper, MSW, LCSW Clinical Social Work [phone]

## 2021-09-19 NOTE — Progress Notes (Signed)
Patient complaining of 8 out of 10 pain, gave scheduled ibuprofen, tylenol and PRN oxycodone 10 mg. Patient states she is still in 8 out 10 pain after medication. Called anesthesiology and they will put in an order for more pain medication for patient.

## 2021-09-20 LAB — CBC
HCT: 29.2 % — ABNORMAL LOW (ref 36.0–46.0)
Hemoglobin: 9 g/dL — ABNORMAL LOW (ref 12.0–15.0)
MCH: 27.5 pg (ref 26.0–34.0)
MCHC: 30.8 g/dL (ref 30.0–36.0)
MCV: 89.3 fL (ref 80.0–100.0)
Platelets: 194 10*3/uL (ref 150–400)
RBC: 3.27 MIL/uL — ABNORMAL LOW (ref 3.87–5.11)
RDW: 15.4 % (ref 11.5–15.5)
WBC: 11.7 10*3/uL — ABNORMAL HIGH (ref 4.0–10.5)
nRBC: 0 % (ref 0.0–0.2)

## 2021-09-20 LAB — GLUCOSE, CAPILLARY: Glucose-Capillary: 109 mg/dL — ABNORMAL HIGH (ref 70–99)

## 2021-09-20 MED ORDER — FERROUS SULFATE 325 (65 FE) MG PO TABS
325.0000 mg | ORAL_TABLET | ORAL | Status: DC
  Administered 2021-09-21: 325 mg via ORAL
  Filled 2021-09-20: qty 1

## 2021-09-20 MED ORDER — DOCUSATE SODIUM 100 MG PO CAPS
100.0000 mg | ORAL_CAPSULE | Freq: Every day | ORAL | Status: DC
  Administered 2021-09-21: 100 mg via ORAL
  Filled 2021-09-20: qty 1

## 2021-09-20 NOTE — Plan of Care (Signed)

## 2021-09-20 NOTE — Progress Notes (Signed)
At bedside to check on patient.    Since her procedure, she has had mild hypotension. She reports overall better than yesterday, but just recently felt a bit more lightheaded. Denies chest pain, palpitations, nausea, vomiting.  She has not passed much gas today.  She denies syncope.  BP 105/68   Pulse 67   Temp 98.1 F (36.7 C) (Oral)   Resp 18   Ht 5\' 2"  (1.575 m)   Wt 100.6 kg   SpO2 99%   Breastfeeding Unknown   BMI 40.55 kg/m    Gen: alert, well appearing, no distress Chest: nonlabored breathing CV: no peripheral edema Abdomen: soft, minimally distended, some tympany. No rebound or guarding. Incision: honeycomb in place with no shadowing.  BG 109.   A/P: Ongoing soft BP. Will replace IV access and provide 500 cc bolus and additional maintenance fluids throughout the night.  BP already improved to 101/60 and patient feeling better.  No tachycardia.  Will repeat CBC stat.  Alpha Gula MD

## 2021-09-20 NOTE — Lactation Note (Signed)
This note was copied from a baby's chart. Lactation Consultation Note  Patient Name: Boy Earnie Rockhold [id number] Date: 09/20/2021 Reason for consult: Follow-up assessment;Early term 37-38.6wks;[place] Age:30 hours  This note covers 2 visits: First visit: Parents/MGM were shown how to put formula in a different bottle so original bottle of formula could be put in the refrigerator for subsequent use. MGM was shown how to play tug-of-war while bottle feeding. Infant did very well with the Similac slow-flow nipple.   Dad was instructed on how to wash pump parts.   Second visit: I observed Mom pumping. We started with size 24 flanges, but it became apparent that she needed size 21 flanges. Mom was shown how to turn on pump & increase suction. Parents know they can call for help with spoon-feeding if there's enough to spoon-feed. Parents' questions were answered.   Maternal Data Does the patient have breastfeeding experience prior to this delivery?: No  Feeding Mother's Current Feeding Choice: Breast Milk and Formula Nipple Type: Slow - flow   Lactation Tools Discussed/Used Tools: Flanges;Pump Flange Size: 21 Breast pump type: Double-Electric Breast Pump Pump Education: Setup, frequency, and cleaning Reason for Pumping: early-term infant with low glucose Pumping frequency: Mom encouraged to pump whenever infant receives formula  Interventions Interventions: DEBP;Education  Discharge Pump: Hands Free    Lurline Hare [facility] 09/20/2021, 7:10 PM

## 2021-09-20 NOTE — Progress Notes (Signed)
Postpartum Progress Note  Postpartum Day 2 s/p primary Cesarean section.  Subjective:  Patient reports no overnight events.  She reports well controlled pain, ambulating without difficulty, voiding spontaneously, tolerating PO.  She reports Positive flatus, Negative BM.  Vaginal bleeding is appropriate.  Objective: Blood pressure (!) 83/50, pulse 60, temperature 97.6 F (36.4 C), temperature source Oral, resp. rate 19, height 5\' 2"  (1.575 m), weight 100.6 kg, SpO2 98 %, unknown if currently breastfeeding.  Physical Exam:  General: alert and no distress Lochia: appropriate Uterine Fundus: firm Abdomen: nondistended, ATTP Incision: dressing in place DVT Evaluation: No evidence of DVT seen on physical exam.  Recent Labs    09/18/21 0045 09/19/21 0433  HGB 11.4* 9.8*  HCT 35.8* 29.1*    Assessment/Plan: Postpartum Day 2, s/p C-section 09/18/21 at 2107 GDM on insulin - appreciate DM coordinator recs. Continue continuous glucose monitoring, follow up with endocrinology Acute blood loss anemia - Fe/Colace.  Hypotension with improved symptoms. UO has picked up after bolus.  Pain better controlled then yesterday. Baby boy - will circ when able Lactation following Doing well, continue routine postpartum care. Anticipate discharge tomorrow   LOS: 2 days   Carlyon Shadow 09/20/2021, 7:20 AM

## 2021-09-20 NOTE — Op Note (Signed)
[name] Blanchard, BADERTSCHER MEDICAL RECORD NO: [id number] ACCOUNT NO: [id number] DATE OF BIRTH: 02/02/1991 FACILITY: [facility] LOCATION: [id number] PHYSICIAN: Adalida Garver L. Vincente Poli, MD  Operative Report   DATE OF PROCEDURE: 09/18/2021  PREOPERATIVE DIAGNOSES:  Intrauterine pregnancy at 38 weeks and 1 day, A2 diabetes and failure to progress.  POSTOPERATIVE DIAGNOSES:  Intrauterine pregnancy at 38 weeks and 1 day, A2 diabetes and failure to progress.  PROCEDURE:  Primary low transverse cesarean section.  SURGEON:  Dr. Vincente Poli.  ANESTHESIA:  Epidural.  ESTIMATED BLOOD LOSS:  500 mL.  COMPLICATIONS:  None.  DRAINS:  Foley catheter.  DESCRIPTION OF PROCEDURE:  The patient was taken to the operating room.  Her epidural was dosed.  She was prepped and draped in the usual sterile fashion.  A timeout was performed.  A low transverse incision was made, carried down to the fascia.  Fascia  was scored in the midline and extended laterally.  The rectus muscles were separated in the midline.  The peritoneum was entered bluntly.  The peritoneal incision was then stretched.  The lower uterine segment was identified.  The bladder flap was  created sharply and then digitally.  The bladder blade was then readjusted.  A low transverse incision was made in the uterus.  Uterus was then entered using a hemostat.  Amniotic fluid was clear.  The baby was in cephalic presentation, was delivered  easily.  It was a female infant and was vigorous in the operating room.  The cord was clamped and cut and the baby was handed to the waiting neonatal team.  The uterus was exteriorized.  The placenta was delivered easily and was manually removed, noted to  be normal and intact with a 3-vessel cord.  Uterine incision was closed in 1 layer using chromic in a running locked stitch.  Uterus was returned to the abdomen.  Irrigation was performed.  Hemostasis was very good.  The peritoneum and fascia were closed  in separate layers using 0  Vicryl in a running stitch.  After the fascia was closed, the subcutaneous was noted to be hemostatic.  The skin was closed with 3-0 Vicryl in a subcuticular fashion.  Benzoin, Steri-Strips and honeycomb dressing were applied.   All sponge, lap and instrument counts were correct x 2.  The patient went to recovery room in stable condition.   MUK D: 09/20/2021 8:46:05 am T: 09/20/2021 10:29:00 am  JOB: [id number]/ [id number]

## 2021-09-20 NOTE — Lactation Note (Signed)
This note was copied from a baby's chart. Lactation Consultation Note Baby fussy, hungry. Will not latch to nipple. Mom has short shaft very compressible nipple. Held t-cup hold nipple into baby's mouth but wouldn't maintain latch. Mom had #20 NS at bedside. LC applied and baby finally latched. Mom needs to firm breast in order for baby to be able to suckle. Baby needed stimulation to suckle at intervals. LC hand expressed collecting colostrum. LC noted baby jittery. Reported to RN. LC hand expressed 3 ml colostrum and spoon fed baby. RN set up DEBP. Mom pumping when LC left. Mom will give baby colostrum after she pumps. Baby had been sleepy until late this evening then started getting fussy but unable to latch. Mom GDM. LC felt that baby needed to be supplemented so spoon fed colostrum. RN is going to ask for glucose lab from MD. [name] noted mid anterior frenulum.  Plan: Mom put baby to the breast w/#20 NS firm breast using "C" hold. Post pump Give baby colostrum that she pumps and hand expresses after feeding to breast.  Encouraged to call for assistance if needed.  Patient Name: [name] Blanchard [id number] Date: 09/20/2021 Reason for consult: RN request;Early term 37-38.6wks;Maternal endocrine disorder;Primapara Age:16 hours  Maternal Data Has patient been taught Hand Expression?: Yes Does the patient have breastfeeding experience prior to this delivery?: No  Feeding    LATCH Score Latch: Repeated attempts needed to sustain latch, nipple held in mouth throughout feeding, stimulation needed to elicit sucking reflex.  Audible Swallowing: None  Type of Nipple: Everted at rest and after stimulation (short shaft/compressible)  Comfort (Breast/Nipple): Soft / non-tender  Hold (Positioning): Assistance needed to correctly position infant at breast and maintain latch.  LATCH Score: 6   Lactation Tools Discussed/Used Tools: Pump;Nipple Shields Nipple shield size: 20 Breast pump  type: Double-Electric Breast Pump Pump Education: Milk Storage;Setup, frequency, and cleaning Reason for Pumping: supplementation/NS Pumping frequency: q3h  Interventions Interventions: Breast feeding basics reviewed;Assisted with latch;Skin to skin;Breast massage;Hand express;Pre-pump if needed;Breast compression;Adjust position;Support pillows;Position options;Expressed milk;DEBP  Discharge    Consult Status Consult Status: Follow-up Date: 09/20/21 Follow-up type: In-patient    [name], [name] 09/20/2021, 1:51 AM

## 2021-09-21 MED ORDER — OXYCODONE HCL 5 MG PO TABS: 5.0000 mg | ORAL_TABLET | ORAL | 0 refills | Status: DC | PRN

## 2021-09-21 MED ORDER — FERROUS SULFATE 325 (65 FE) MG PO TABS: 325.0000 mg | ORAL_TABLET | ORAL | 3 refills | Status: DC

## 2021-09-21 MED ORDER — DOCUSATE SODIUM 100 MG PO CAPS: 100.0000 mg | ORAL_CAPSULE | Freq: Every day | ORAL | 0 refills | Status: DC

## 2021-09-21 MED ORDER — METFORMIN HCL 500 MG PO TABS: 500.0000 mg | ORAL_TABLET | Freq: Two times a day (BID) | ORAL | 3 refills | Status: DC

## 2021-09-21 MED ORDER — IBUPROFEN 600 MG PO TABS: 600.0000 mg | ORAL_TABLET | Freq: Four times a day (QID) | ORAL | 0 refills | Status: DC

## 2021-09-21 MED ORDER — ACETAMINOPHEN 325 MG PO TABS: 650.0000 mg | ORAL_TABLET | Freq: Four times a day (QID) | ORAL | 0 refills | Status: DC | PRN

## 2021-09-21 NOTE — Lactation Note (Signed)
This note was copied from a baby's chart. Lactation Consultation Note  Patient Name: [name] Blanchard [id number] Date: 09/21/2021 Reason for consult: Follow-up assessment;Primapara;Maternal endocrine disorder Age:30 hours  Mom shared with me that for the last 3 pumping sessions she has hardly obtained anything with pumping. I reassured her and reiterated that pumping was for breast stimulation. Hand expression was taught to Mom & Mom was able to return demonstration successfully, obtaining a total of 2 mL. I encouraged Mom to incorporate hand expression after pumping. Mom reports that her breasts do feel bigger today. Nipples are atraumatic (previous nipple piercings noted).   I noted mild jitteriness x 2 when I was in the room. I shared this with Edythe Clarity, RN & Dr. Jena Gauss.    Maternal Data Has patient been taught Hand Expression?: Yes  Feeding Mother's Current Feeding Choice: Breast Milk and Formula Nipple Type: Slow - flow   Lactation Tools Discussed/Used Tools: Pump;Flanges Flange Size: 21 Breast pump type: Double-Electric Breast Pump Reason for Pumping: stimulate breasts Pumped volume: 0 mL  Interventions Interventions: Hand express;Education  Discharge Pump: Hands Free  Consult Status Consult Status: Complete   [name] Blanchard 09/21/2021, 9:45 AM

## 2021-09-21 NOTE — Discharge Summary (Signed)
Obstetric Discharge Summary  [name] Blanchard is a 31 y.o. female that presented on 09/18/2021 for IOL at 38 weeks for GDM on insulin.  She was admitted to labor and delivery for her induction.  Her labor course was complicated by failure to progress and she delivered a viable female infant on 09/18/21 via primary low transverse C section.  Her postpartum course was uncomplicated and on PPD#3, she reported well controlled pain, spontaneous voiding, ambulating without difficulty, and tolerating PO.  She was stable for discharge home on 09/21/21 with plans for in-office follow up.  Hemoglobin  Date Value Ref Range Status  09/20/2021 9.0 (L) 12.0 - 15.0 g/dL Final   HCT  Date Value Ref Range Status  09/20/2021 29.2 (L) 36.0 - 46.0 % Final    Physical Exam:  General: alert no distress Lochia: appropriate Uterine Fundus: firm Incision: healing well DVT Evaluation: No evidence of DVT seen on physical exam.  Discharge Diagnoses: Term Pregnancy-delivered  Discharge Information: Date: 09/21/2021 Activity: Pelvic rest, as tolerated Diet: routine Medications: Tylenol, motrin, oxycodone, iron sulfate, colace Condition: stable Instructions: Refer to practice specific booklet.  Discussed prior to discharge.  Discharge to: Home  Follow-up Information     [facility] Follow up.   Why: Please follow up with endocrinology and for your 6 week postpartum visit. Contact information: [address] [place] [address] [phone]                 Newborn Data: Live born female  Birth Weight: 6 lb 14.1 oz (3120 g) APGAR: 9, 9  Newborn Delivery   Birth date/time: 09/18/2021 21:07:00 Delivery type: C-Section, Low Transverse Trial of labor: Yes C-section categorization: Primary      Home with mother.  [name] Blanchard 09/21/2021, 4:01 PM

## 2021-09-21 NOTE — Progress Notes (Signed)
Postpartum Progress Note  Postpartum Day 3 s/p primary Cesarean section.  Subjective:  Yesterday evening she had some brief lightheadedness without syncope. BP and symptoms improved with rest and IVF bolus. She feels better this AM. She reports well controlled pain, ambulating without difficulty, voiding spontaneously, tolerating PO.  She reports Positive flatus, Positive BM.  Vaginal bleeding is appropriate.  Objective: Blood pressure (!) 95/58, pulse 63, temperature 97.7 F (36.5 C), temperature source Oral, resp. rate 18, height 5\' 2"  (1.575 m), weight 100.6 kg, SpO2 100 %, unknown if currently breastfeeding.  Physical Exam:  General: alert and no distress Lochia: appropriate Uterine Fundus: firm Abdomen: soft distension is improved from yesterday, patient has had several bowel movements overnight Incision: dressing in place DVT Evaluation: No evidence of DVT seen on physical exam.  Recent Labs    09/19/21 0433 09/20/21 2110  HGB 9.8* 9.0*  HCT 29.1* 29.2*    Assessment/Plan: Postpartum Day 3, s/p C-section Fe colace CBC obtained yesterday, suspect equilibration (postpartum CBC was just a few hrs from her CS).   Baby boy - circ delayed yesterday, will perform if cleared. Lactation following Doing well, continue routine postpartum care. Anticipate discharge today   LOS: 3 days   2111 09/21/2021, 7:18 AM

## 2021-09-26 ENCOUNTER — Telehealth (HOSPITAL_COMMUNITY): Payer: Self-pay | Admitting: *Deleted

## 2021-09-26 NOTE — Telephone Encounter (Signed)
Patient voiced no questions or concerns regarding her health at this time. EPDS=7. Patient verbalized concern that infant "isn't gaining weight." Reported that he lost weight while in the hospital and then gained some before his first peds appointment. Was seen on 5/30 and had not lost weight, but had not gained. Patient reported that she has seen a Advertising copywriter for assistance with latch concerns. Patient reported that infant is voiding and stooling appropriately. Patient is pumping and supplementing with formula. Next peds appointment scheduled for 6/6. RN encouraged patient that voiding and stooling pattern are indicators that infant is getting sufficient amount - encouraged patient to call peds if not seeing at least 6 wet diapers per day and/or if stooling pattern changes. Patient verbalized understanding. Patient voiced no questions or concerns regarding infant at this time. Patient reports infant sleeps in a bassinet on his back. RN reviewed ABCs of safe sleep. Patient verbalized understanding. Patient requested RN email information on hospital's virtual postpartum classes and support groups. Also requested RN email outpatient lactation phone number and lactation consultant message line number. Email sent. Deforest Hoyles, RN, 09/26/21, [phone]

## 2022-04-29 DIAGNOSIS — E1169 Type 2 diabetes mellitus with other specified complication: Secondary | ICD-10-CM | POA: Diagnosis not present

## 2022-04-29 DIAGNOSIS — E669 Obesity, unspecified: Secondary | ICD-10-CM | POA: Diagnosis not present

## 2022-04-29 DIAGNOSIS — R112 Nausea with vomiting, unspecified: Secondary | ICD-10-CM | POA: Diagnosis not present

## 2022-04-29 DIAGNOSIS — Z6838 Body mass index (BMI) 38.0-38.9, adult: Secondary | ICD-10-CM | POA: Diagnosis not present

## 2022-04-30 DIAGNOSIS — F411 Generalized anxiety disorder: Secondary | ICD-10-CM | POA: Diagnosis not present

## 2022-04-30 DIAGNOSIS — F900 Attention-deficit hyperactivity disorder, predominantly inattentive type: Secondary | ICD-10-CM | POA: Diagnosis not present

## 2022-04-30 DIAGNOSIS — F4312 Post-traumatic stress disorder, chronic: Secondary | ICD-10-CM | POA: Diagnosis not present

## 2022-05-12 DIAGNOSIS — F4312 Post-traumatic stress disorder, chronic: Secondary | ICD-10-CM | POA: Diagnosis not present

## 2022-05-12 DIAGNOSIS — F900 Attention-deficit hyperactivity disorder, predominantly inattentive type: Secondary | ICD-10-CM | POA: Diagnosis not present

## 2022-05-12 DIAGNOSIS — F411 Generalized anxiety disorder: Secondary | ICD-10-CM | POA: Diagnosis not present

## 2022-05-26 DIAGNOSIS — F4312 Post-traumatic stress disorder, chronic: Secondary | ICD-10-CM | POA: Diagnosis not present

## 2022-05-26 DIAGNOSIS — F411 Generalized anxiety disorder: Secondary | ICD-10-CM | POA: Diagnosis not present

## 2022-05-26 DIAGNOSIS — F900 Attention-deficit hyperactivity disorder, predominantly inattentive type: Secondary | ICD-10-CM | POA: Diagnosis not present

## 2022-06-09 DIAGNOSIS — F4312 Post-traumatic stress disorder, chronic: Secondary | ICD-10-CM | POA: Diagnosis not present

## 2022-06-09 DIAGNOSIS — F411 Generalized anxiety disorder: Secondary | ICD-10-CM | POA: Diagnosis not present

## 2022-06-09 DIAGNOSIS — F900 Attention-deficit hyperactivity disorder, predominantly inattentive type: Secondary | ICD-10-CM | POA: Diagnosis not present

## 2022-06-13 DIAGNOSIS — R635 Abnormal weight gain: Secondary | ICD-10-CM | POA: Diagnosis not present

## 2022-06-13 DIAGNOSIS — E1165 Type 2 diabetes mellitus with hyperglycemia: Secondary | ICD-10-CM | POA: Diagnosis not present

## 2022-06-13 DIAGNOSIS — Z794 Long term (current) use of insulin: Secondary | ICD-10-CM | POA: Diagnosis not present

## 2022-06-23 DIAGNOSIS — J45909 Unspecified asthma, uncomplicated: Secondary | ICD-10-CM | POA: Diagnosis not present

## 2022-06-23 DIAGNOSIS — Z6838 Body mass index (BMI) 38.0-38.9, adult: Secondary | ICD-10-CM | POA: Diagnosis not present

## 2022-06-23 DIAGNOSIS — J069 Acute upper respiratory infection, unspecified: Secondary | ICD-10-CM | POA: Diagnosis not present

## 2022-06-23 DIAGNOSIS — E1169 Type 2 diabetes mellitus with other specified complication: Secondary | ICD-10-CM | POA: Diagnosis not present

## 2022-06-23 DIAGNOSIS — R6889 Other general symptoms and signs: Secondary | ICD-10-CM | POA: Diagnosis not present

## 2022-06-23 DIAGNOSIS — E669 Obesity, unspecified: Secondary | ICD-10-CM | POA: Diagnosis not present

## 2022-06-23 DIAGNOSIS — Z20822 Contact with and (suspected) exposure to covid-19: Secondary | ICD-10-CM | POA: Diagnosis not present

## 2022-06-23 DIAGNOSIS — J309 Allergic rhinitis, unspecified: Secondary | ICD-10-CM | POA: Diagnosis not present

## 2022-06-24 DIAGNOSIS — F4312 Post-traumatic stress disorder, chronic: Secondary | ICD-10-CM | POA: Diagnosis not present

## 2022-06-24 DIAGNOSIS — F411 Generalized anxiety disorder: Secondary | ICD-10-CM | POA: Diagnosis not present

## 2022-06-24 DIAGNOSIS — F900 Attention-deficit hyperactivity disorder, predominantly inattentive type: Secondary | ICD-10-CM | POA: Diagnosis not present

## 2022-07-08 DIAGNOSIS — E669 Obesity, unspecified: Secondary | ICD-10-CM | POA: Diagnosis not present

## 2022-07-08 DIAGNOSIS — Z6837 Body mass index (BMI) 37.0-37.9, adult: Secondary | ICD-10-CM | POA: Diagnosis not present

## 2022-07-08 DIAGNOSIS — J45909 Unspecified asthma, uncomplicated: Secondary | ICD-10-CM | POA: Diagnosis not present

## 2022-07-08 DIAGNOSIS — F411 Generalized anxiety disorder: Secondary | ICD-10-CM | POA: Diagnosis not present

## 2022-07-08 DIAGNOSIS — F900 Attention-deficit hyperactivity disorder, predominantly inattentive type: Secondary | ICD-10-CM | POA: Diagnosis not present

## 2022-07-08 DIAGNOSIS — J309 Allergic rhinitis, unspecified: Secondary | ICD-10-CM | POA: Diagnosis not present

## 2022-07-08 DIAGNOSIS — F4312 Post-traumatic stress disorder, chronic: Secondary | ICD-10-CM | POA: Diagnosis not present

## 2022-07-08 DIAGNOSIS — N926 Irregular menstruation, unspecified: Secondary | ICD-10-CM | POA: Diagnosis not present

## 2022-07-08 DIAGNOSIS — E1169 Type 2 diabetes mellitus with other specified complication: Secondary | ICD-10-CM | POA: Diagnosis not present

## 2022-07-11 DIAGNOSIS — J45909 Unspecified asthma, uncomplicated: Secondary | ICD-10-CM | POA: Diagnosis not present

## 2022-07-11 DIAGNOSIS — E1169 Type 2 diabetes mellitus with other specified complication: Secondary | ICD-10-CM | POA: Diagnosis not present

## 2022-07-11 DIAGNOSIS — N926 Irregular menstruation, unspecified: Secondary | ICD-10-CM | POA: Diagnosis not present

## 2022-07-11 DIAGNOSIS — J309 Allergic rhinitis, unspecified: Secondary | ICD-10-CM | POA: Diagnosis not present

## 2022-07-11 DIAGNOSIS — E669 Obesity, unspecified: Secondary | ICD-10-CM | POA: Diagnosis not present

## 2022-07-11 DIAGNOSIS — K529 Noninfective gastroenteritis and colitis, unspecified: Secondary | ICD-10-CM | POA: Diagnosis not present

## 2022-07-11 DIAGNOSIS — Z6837 Body mass index (BMI) 37.0-37.9, adult: Secondary | ICD-10-CM | POA: Diagnosis not present

## 2022-07-12 ENCOUNTER — Emergency Department (HOSPITAL_BASED_OUTPATIENT_CLINIC_OR_DEPARTMENT_OTHER)
Admission: EM | Admit: 2022-07-12 | Discharge: 2022-07-12 | Disposition: A | Discharge status: Home / Self Care | Payer: Commercial Managed Care - PPO | Attending: Emergency Medicine | Admitting: Emergency Medicine

## 2022-07-12 ENCOUNTER — Encounter (HOSPITAL_BASED_OUTPATIENT_CLINIC_OR_DEPARTMENT_OTHER): Payer: Self-pay | Admitting: Urology

## 2022-07-12 ENCOUNTER — Emergency Department (HOSPITAL_BASED_OUTPATIENT_CLINIC_OR_DEPARTMENT_OTHER): Payer: Commercial Managed Care - PPO

## 2022-07-12 ENCOUNTER — Other Ambulatory Visit: Payer: Self-pay

## 2022-07-12 DIAGNOSIS — E119 Type 2 diabetes mellitus without complications: Secondary | ICD-10-CM | POA: Diagnosis not present

## 2022-07-12 DIAGNOSIS — R1010 Upper abdominal pain, unspecified: Secondary | ICD-10-CM | POA: Insufficient documentation

## 2022-07-12 DIAGNOSIS — K760 Fatty (change of) liver, not elsewhere classified: Secondary | ICD-10-CM | POA: Diagnosis not present

## 2022-07-12 DIAGNOSIS — R197 Diarrhea, unspecified: Secondary | ICD-10-CM | POA: Diagnosis not present

## 2022-07-12 DIAGNOSIS — Z7984 Long term (current) use of oral hypoglycemic drugs: Secondary | ICD-10-CM | POA: Diagnosis not present

## 2022-07-12 DIAGNOSIS — R112 Nausea with vomiting, unspecified: Secondary | ICD-10-CM | POA: Diagnosis not present

## 2022-07-12 DIAGNOSIS — R1110 Vomiting, unspecified: Secondary | ICD-10-CM | POA: Diagnosis not present

## 2022-07-12 DIAGNOSIS — B199 Unspecified viral hepatitis without hepatic coma: Secondary | ICD-10-CM | POA: Diagnosis not present

## 2022-07-12 DIAGNOSIS — K759 Inflammatory liver disease, unspecified: Secondary | ICD-10-CM

## 2022-07-12 DIAGNOSIS — R109 Unspecified abdominal pain: Secondary | ICD-10-CM | POA: Diagnosis not present

## 2022-07-12 LAB — CBC WITH DIFFERENTIAL/PLATELET
Abs Immature Granulocytes: 0.05 10*3/uL (ref 0.00–0.07)
Basophils Absolute: 0 10*3/uL (ref 0.0–0.1)
Basophils Relative: 0 %
Eosinophils Absolute: 0.1 10*3/uL (ref 0.0–0.5)
Eosinophils Relative: 1 %
HCT: 43.5 % (ref 36.0–46.0)
Hemoglobin: 14 g/dL (ref 12.0–15.0)
Immature Granulocytes: 1 %
Lymphocytes Relative: 13 %
Lymphs Abs: 1.4 10*3/uL (ref 0.7–4.0)
MCH: 29 pg (ref 26.0–34.0)
MCHC: 32.2 g/dL (ref 30.0–36.0)
MCV: 90.1 fL (ref 80.0–100.0)
Monocytes Absolute: 0.7 10*3/uL (ref 0.1–1.0)
Monocytes Relative: 7 %
Neutro Abs: 8 10*3/uL — ABNORMAL HIGH (ref 1.7–7.7)
Neutrophils Relative %: 78 %
Platelets: 345 10*3/uL (ref 150–400)
RBC: 4.83 MIL/uL (ref 3.87–5.11)
RDW: 13.4 % (ref 11.5–15.5)
WBC: 10.3 10*3/uL (ref 4.0–10.5)
nRBC: 0 % (ref 0.0–0.2)

## 2022-07-12 LAB — COMPREHENSIVE METABOLIC PANEL
ALT: 476 U/L — ABNORMAL HIGH (ref 0–44)
AST: 653 U/L — ABNORMAL HIGH (ref 15–41)
Albumin: 4.2 g/dL (ref 3.5–5.0)
Alkaline Phosphatase: 116 U/L (ref 38–126)
Anion gap: 8 (ref 5–15)
BUN: 11 mg/dL (ref 6–20)
CO2: 26 mmol/L (ref 22–32)
Calcium: 8.9 mg/dL (ref 8.9–10.3)
Chloride: 103 mmol/L (ref 98–111)
Creatinine, Ser: 0.9 mg/dL (ref 0.44–1.00)
GFR, Estimated: >60 mL/min (ref >60)
Glucose, Bld: 124 mg/dL — ABNORMAL HIGH (ref 70–99)
Potassium: 3.8 mmol/L (ref 3.5–5.1)
Sodium: 137 mmol/L (ref 135–145)
Total Bilirubin: 1.6 mg/dL — ABNORMAL HIGH (ref 0.3–1.2)
Total Protein: 8.6 g/dL — ABNORMAL HIGH (ref 6.5–8.1)

## 2022-07-12 LAB — PREGNANCY, URINE: Preg Test, Ur: NEGATIVE

## 2022-07-12 LAB — URINALYSIS, MICROSCOPIC (REFLEX)
Bacteria, UA: NONE SEEN
RBC / HPF: NONE SEEN RBC/hpf (ref 0–5)

## 2022-07-12 LAB — URINALYSIS, ROUTINE W REFLEX MICROSCOPIC
Glucose, UA: NEGATIVE mg/dL
Hgb urine dipstick: NEGATIVE
Ketones, ur: NEGATIVE mg/dL
Leukocytes,Ua: NEGATIVE
Nitrite: NEGATIVE
Protein, ur: 30 mg/dL — AB
Specific Gravity, Urine: 1.02 (ref 1.005–1.030)
pH: 6 (ref 5.0–8.0)

## 2022-07-12 LAB — LIPASE, BLOOD: Lipase: 34 U/L (ref 11–51)

## 2022-07-12 LAB — HEPATITIS PANEL, ACUTE
HCV Ab: NONREACTIVE
Hep A IgM: NONREACTIVE
Hep B C IgM: NONREACTIVE
Hepatitis B Surface Ag: NONREACTIVE

## 2022-07-12 MED ORDER — ONDANSETRON HCL 4 MG/2ML IJ SOLN
4.0000 mg | Freq: Once | INTRAMUSCULAR | Status: AC
  Administered 2022-07-12: 4 mg via INTRAVENOUS
  Filled 2022-07-12: qty 2

## 2022-07-12 MED ORDER — IOHEXOL 300 MG/ML  SOLN
100.0000 mL | Freq: Once | INTRAMUSCULAR | Status: AC | PRN
  Administered 2022-07-12: 100 mL via INTRAVENOUS

## 2022-07-12 MED ORDER — SODIUM CHLORIDE 0.9 % IV SOLN: INTRAVENOUS | Status: DC

## 2022-07-12 MED ORDER — SODIUM CHLORIDE 0.9 % IV BOLUS
1000.0000 mL | Freq: Once | INTRAVENOUS | Status: AC
  Administered 2022-07-12: 1000 mL via INTRAVENOUS

## 2022-07-12 MED ORDER — ONDANSETRON HCL 8 MG PO TABS: 8.0000 mg | ORAL_TABLET | Freq: Three times a day (TID) | ORAL | 0 refills | Status: DC | PRN

## 2022-07-12 MED ORDER — HYDROMORPHONE HCL 1 MG/ML IJ SOLN
1.0000 mg | Freq: Once | INTRAMUSCULAR | Status: AC
  Administered 2022-07-12: 1 mg via INTRAVENOUS
  Filled 2022-07-12: qty 1

## 2022-07-12 NOTE — ED Notes (Signed)
Pt discharged to home. Discharge instructions have been discussed with patient and/or family members. Pt verbally acknowledges understanding d/c instructions, and endorses comprehension to checkout at registration before leaving.  °

## 2022-07-12 NOTE — ED Triage Notes (Signed)
Pt states N/V/D x 3 days  States bloating and generalized abdominal pain worse in the upper  Taking bentyl and phenergan with little relief   Pcp showed low amylase yesterday  H/o necrotizing pancreatitis

## 2022-07-12 NOTE — Discharge Instructions (Signed)
Your liver tests were elevated today.  The CT scan does not show any obvious cause for this.  Avoid taking Tylenol or drinking any alcohol.  Your liver tests will need to be rechecked to make sure they are improving and not getting any worse.  Contact the GI doctor to make an appointment.  Follow-up with your primary doctor to have your liver test rechecked if you are not able to see the GI doctor next week.  Return to the ER for worsening symptoms

## 2022-07-12 NOTE — ED Provider Notes (Signed)
[facility] Provider Note   CSN: [id number] Arrival date & time: 07/12/22  1002     History  Chief Complaint  Patient presents with   Abdominal Pain    [name] Blanchard is a 32 y.o. female.   Abdominal Pain    Patient has a history of diabetes, pancreatitis related to gallstones.  Patient has had prior abdominal surgeries including a cholecystectomy and cesarean section.  Patient presents ED with complaints of abdominal pain that started a couple days ago.  This nausea and vomiting and diarrhea.  She does not feel like she has been digesting her food properly.  Symptoms get worse when she tries eat or drink.  She is felt abdominal bloating and pain in her upper abdomen in the middle and in both sides.  She denies any pain in her lower abdomen.  Patient went to see her doctor yesterday.  She was told her amylase was not elevated in fact it was low.  Home Medications Prior to Admission medications   Medication Sig Start Date End Date Taking? Authorizing Provider  ondansetron (ZOFRAN) 8 MG tablet Take 1 tablet (8 mg total) by mouth every 8 (eight) hours as needed for nausea or vomiting. 07/12/22  Yes Dorie Rank, MD  acetaminophen (TYLENOL) 325 MG tablet Take 2 tablets (650 mg total) by mouth every 6 (six) hours as needed. 09/21/21   Carlyon Shadow, MD  docusate sodium (COLACE) 100 MG capsule Take 1 capsule (100 mg total) by mouth daily. 09/22/21   Carlyon Shadow, MD  ferrous sulfate 325 (65 FE) MG tablet Take 1 tablet (325 mg total) by mouth every other day. 09/23/21   Carlyon Shadow, MD  ibuprofen (ADVIL) 600 MG tablet Take 1 tablet (600 mg total) by mouth every 6 (six) hours. 09/21/21   Carlyon Shadow, MD  metFORMIN (GLUCOPHAGE) 500 MG tablet Take 1 tablet (500 mg total) by mouth 2 (two) times daily with a meal. 09/21/21   Carlyon Shadow, MD  oxyCODONE (OXY IR/ROXICODONE) 5 MG immediate release tablet Take 1 tablet (5 mg  total) by mouth every 4 (four) hours as needed for moderate pain. 09/21/21   Carlyon Shadow, MD  Prenatal Vit-Fe Fumarate-FA (PRENATAL MULTIVITAMIN) TABS tablet Take 1 tablet by mouth daily at 12 noon.    [provider]      Allergies    Morphine and Reglan [metoclopramide]    Review of Systems   Review of Systems  Gastrointestinal:  Positive for abdominal pain.    Physical Exam Updated Vital Signs BP (!) 138/103 (BP Location: Left Arm)   Pulse 92   Temp 97.6 F (36.4 C) (Tympanic)   Resp 18   Ht 1.575 m (5\' 2" )   Wt 93 kg   SpO2 100%   BMI 37.49 kg/m  Physical Exam Vitals and nursing note reviewed.  Constitutional:      General: She is not in acute distress.    Appearance: She is well-developed.  HENT:     Head: Normocephalic and atraumatic.     Right Ear: External ear normal.     Left Ear: External ear normal.  Eyes:     General: No scleral icterus.       Right eye: No discharge.        Left eye: No discharge.     Conjunctiva/sclera: Conjunctivae normal.  Neck:     Trachea: No tracheal deviation.  Cardiovascular:  Rate and Rhythm: Normal rate and regular rhythm.  Pulmonary:     Effort: Pulmonary effort is normal. No respiratory distress.     Breath sounds: Normal breath sounds. No stridor. No wheezing or rales.  Abdominal:     General: Bowel sounds are normal. There is no distension.     Palpations: Abdomen is soft.     Tenderness: There is abdominal tenderness in the right upper quadrant, epigastric area and left upper quadrant. There is no guarding or rebound.  Musculoskeletal:        General: No tenderness or deformity.     Cervical back: Neck supple.  Skin:    General: Skin is warm and dry.     Findings: No rash.  Neurological:     General: No focal deficit present.     Mental Status: She is alert.     Cranial Nerves: No cranial nerve deficit, dysarthria or facial asymmetry.     Sensory: No sensory deficit.     Motor: No abnormal  muscle tone or seizure activity.     Coordination: Coordination normal.  Psychiatric:        Mood and Affect: Mood normal.     ED Results / Procedures / Treatments   Labs (all labs ordered are listed, but only abnormal results are displayed) Labs Reviewed  COMPREHENSIVE METABOLIC PANEL - Abnormal; Notable for the following components:      Result Value   Glucose, Bld 124 (*)    Total Protein 8.6 (*)    AST 653 (*)    ALT 476 (*)    Total Bilirubin 1.6 (*)    All other components within normal limits  CBC WITH DIFFERENTIAL/PLATELET - Abnormal; Notable for the following components:   Neutro Abs 8.0 (*)    All other components within normal limits  URINALYSIS, ROUTINE W REFLEX MICROSCOPIC - Abnormal; Notable for the following components:   Color, Urine AMBER (*)    Bilirubin Urine SMALL (*)    Protein, ur 30 (*)    All other components within normal limits  LIPASE, BLOOD  PREGNANCY, URINE  URINALYSIS, MICROSCOPIC (REFLEX)  HEPATITIS PANEL, ACUTE    EKG None  Radiology CT ABDOMEN PELVIS W CONTRAST  Result Date: 07/12/2022 CLINICAL DATA:  Elevated liver enzymes. Nausea, vomiting and diarrhea 3 days with generalized abdominal pain and bloating. History of pancreatitis. EXAM: CT ABDOMEN AND PELVIS WITH CONTRAST TECHNIQUE: Multidetector CT imaging of the abdomen and pelvis was performed using the standard protocol following bolus administration of intravenous contrast. RADIATION DOSE REDUCTION: This exam was performed according to the departmental dose-optimization program which includes automated exposure control, adjustment of the mA and/or kV according to patient size and/or use of iterative reconstruction technique. CONTRAST:  171mL OMNIPAQUE IOHEXOL 300 MG/ML  SOLN COMPARISON:  09/09/2014 FINDINGS: Lower chest: Lung bases are clear. Hepatobiliary: Previous cholecystectomy. Mild diffuse low-attenuation of the liver without focal mass. Biliary tree is normal. Pancreas: Significant  thinning of the body of the pancreas which is otherwise normal. Spleen: Normal. Adrenals/Urinary Tract: Adrenal glands are normal. Kidneys are normal in size without hydronephrosis or nephrolithiasis. Ureters and bladder are normal. Stomach/Bowel: Stomach and small bowel are unremarkable. Appendix is normal. Colon is unremarkable. Vascular/Lymphatic: Abdominal aorta is normal in caliber. No adenopathy. Reproductive: Uterus and bilateral adnexa are unremarkable. Other: No free fluid or focal inflammatory change. Musculoskeletal: No focal bony abnormality. IMPRESSION: 1. No acute findings in the abdomen/pelvis. 2. Mild hepatic steatosis. Electronically Signed   By: Quillian Quince  Derrel Nip M.D.   On: 07/12/2022 12:22    Procedures Procedures    Medications Ordered in ED Medications  sodium chloride 0.9 % bolus 1,000 mL (0 mLs Intravenous Stopped 07/12/22 1142)    And  0.9 %  sodium chloride infusion ( Intravenous New Bag/Given 07/12/22 1142)  HYDROmorphone (DILAUDID) injection 1 mg (1 mg Intravenous Given 07/12/22 1042)  ondansetron (ZOFRAN) injection 4 mg (4 mg Intravenous Given 07/12/22 1041)  iohexol (OMNIPAQUE) 300 MG/ML solution 100 mL (100 mLs Intravenous Contrast Given 07/12/22 1201)    ED Course/ Medical Decision Making/ A&P Clinical Course as of 07/12/22 1242  Sat Jul 12, 2022  1226 CT scan shows mild hepatic steatosis.  Otherwise no acute process.  No biliary dilatation [JK]  1237 CBC is normal.  Metabolic panel is normal.  Lipase is normal.  LFTs do show elevation in AST ALT and mild elevation in total bili [JK]  1237 Patient improved with treatment here in the ED.  No nausea or vomiting. [JK]    Clinical Course User Index [JK] Dorie Rank, MD                             Medical Decision Making Problems Addressed: Hepatitis: acute illness or injury Vomiting and diarrhea: acute illness or injury  Amount and/or Complexity of Data Reviewed Labs: ordered. Decision-making details documented in  ED Course. Radiology: ordered and independent interpretation performed.  Risk Prescription drug management.   Patient presents ED with complaints of abdominal pain vomiting diarrhea.  Patient does have prior history of necrotizing pancreatitis and cholecystitis.  Patient has some mild tenderness on exam today.  Labs reassuring with the exception of elevated liver function test.  She has normal lipase.  Patient was treated with IV fluids and medication.  Her symptoms have resolved.  She is not having any vomiting.  CT scan does not show any acute abnormality, no evidence of biliary dilatation.  It is possible patient may have a viral pancreatitis.  With her history of necrotizing pancreatitis and cholecystitis I do think she will require GI follow-up.  With her symptoms improving no vomiting or diarrhea I think she is stable for close outpatient follow-up.  Will have her follow-up with gastroenterology.  Avoid Tylenol alcohol.  Patient understands return to the ED for worsening symptoms.  She will need close follow-up to make sure her liver enzymes are improving.  I have added on hepatitis panel        Final Clinical Impression(s) / ED Diagnoses Final diagnoses:  Hepatitis  Vomiting and diarrhea    Rx / DC Orders ED Discharge Orders          Ordered    ondansetron (ZOFRAN) 8 MG tablet  Every 8 hours PRN        07/12/22 1237              Dorie Rank, MD 07/12/22 1242

## 2022-07-15 DIAGNOSIS — E1169 Type 2 diabetes mellitus with other specified complication: Secondary | ICD-10-CM | POA: Diagnosis not present

## 2022-07-15 DIAGNOSIS — J309 Allergic rhinitis, unspecified: Secondary | ICD-10-CM | POA: Diagnosis not present

## 2022-07-15 DIAGNOSIS — J45909 Unspecified asthma, uncomplicated: Secondary | ICD-10-CM | POA: Diagnosis not present

## 2022-07-15 DIAGNOSIS — Z6837 Body mass index (BMI) 37.0-37.9, adult: Secondary | ICD-10-CM | POA: Diagnosis not present

## 2022-07-15 DIAGNOSIS — K529 Noninfective gastroenteritis and colitis, unspecified: Secondary | ICD-10-CM | POA: Diagnosis not present

## 2022-07-15 DIAGNOSIS — N926 Irregular menstruation, unspecified: Secondary | ICD-10-CM | POA: Diagnosis not present

## 2022-07-15 DIAGNOSIS — E669 Obesity, unspecified: Secondary | ICD-10-CM | POA: Diagnosis not present

## 2022-07-16 DIAGNOSIS — N926 Irregular menstruation, unspecified: Secondary | ICD-10-CM | POA: Diagnosis not present

## 2022-07-16 DIAGNOSIS — J309 Allergic rhinitis, unspecified: Secondary | ICD-10-CM | POA: Diagnosis not present

## 2022-07-16 DIAGNOSIS — E669 Obesity, unspecified: Secondary | ICD-10-CM | POA: Diagnosis not present

## 2022-07-16 DIAGNOSIS — K529 Noninfective gastroenteritis and colitis, unspecified: Secondary | ICD-10-CM | POA: Diagnosis not present

## 2022-07-16 DIAGNOSIS — E1169 Type 2 diabetes mellitus with other specified complication: Secondary | ICD-10-CM | POA: Diagnosis not present

## 2022-07-16 DIAGNOSIS — R7989 Other specified abnormal findings of blood chemistry: Secondary | ICD-10-CM | POA: Diagnosis not present

## 2022-07-16 DIAGNOSIS — Z6837 Body mass index (BMI) 37.0-37.9, adult: Secondary | ICD-10-CM | POA: Diagnosis not present

## 2022-07-16 DIAGNOSIS — J45909 Unspecified asthma, uncomplicated: Secondary | ICD-10-CM | POA: Diagnosis not present

## 2022-07-17 DIAGNOSIS — F900 Attention-deficit hyperactivity disorder, predominantly inattentive type: Secondary | ICD-10-CM | POA: Diagnosis not present

## 2022-07-17 DIAGNOSIS — F4312 Post-traumatic stress disorder, chronic: Secondary | ICD-10-CM | POA: Diagnosis not present

## 2022-07-17 DIAGNOSIS — F411 Generalized anxiety disorder: Secondary | ICD-10-CM | POA: Diagnosis not present

## 2022-07-21 DIAGNOSIS — K529 Noninfective gastroenteritis and colitis, unspecified: Secondary | ICD-10-CM | POA: Diagnosis not present

## 2022-07-21 DIAGNOSIS — F900 Attention-deficit hyperactivity disorder, predominantly inattentive type: Secondary | ICD-10-CM | POA: Diagnosis not present

## 2022-07-21 DIAGNOSIS — F411 Generalized anxiety disorder: Secondary | ICD-10-CM | POA: Diagnosis not present

## 2022-07-21 DIAGNOSIS — F4312 Post-traumatic stress disorder, chronic: Secondary | ICD-10-CM | POA: Diagnosis not present

## 2022-07-22 DIAGNOSIS — N926 Irregular menstruation, unspecified: Secondary | ICD-10-CM | POA: Diagnosis not present

## 2022-07-23 DIAGNOSIS — R7989 Other specified abnormal findings of blood chemistry: Secondary | ICD-10-CM | POA: Diagnosis not present

## 2022-07-23 DIAGNOSIS — N926 Irregular menstruation, unspecified: Secondary | ICD-10-CM | POA: Diagnosis not present

## 2022-07-23 DIAGNOSIS — E1169 Type 2 diabetes mellitus with other specified complication: Secondary | ICD-10-CM | POA: Diagnosis not present

## 2022-07-23 DIAGNOSIS — J45909 Unspecified asthma, uncomplicated: Secondary | ICD-10-CM | POA: Diagnosis not present

## 2022-07-23 DIAGNOSIS — J309 Allergic rhinitis, unspecified: Secondary | ICD-10-CM | POA: Diagnosis not present

## 2022-07-23 DIAGNOSIS — E669 Obesity, unspecified: Secondary | ICD-10-CM | POA: Diagnosis not present

## 2022-07-23 DIAGNOSIS — Z6837 Body mass index (BMI) 37.0-37.9, adult: Secondary | ICD-10-CM | POA: Diagnosis not present

## 2022-07-28 ENCOUNTER — Emergency Department (HOSPITAL_BASED_OUTPATIENT_CLINIC_OR_DEPARTMENT_OTHER)
Admission: EM | Admit: 2022-07-28 | Discharge: 2022-07-28 | Disposition: A | Discharge status: Home / Self Care | Payer: Commercial Managed Care - PPO | Attending: Emergency Medicine | Admitting: Emergency Medicine

## 2022-07-28 ENCOUNTER — Encounter (HOSPITAL_BASED_OUTPATIENT_CLINIC_OR_DEPARTMENT_OTHER): Payer: Self-pay

## 2022-07-28 ENCOUNTER — Other Ambulatory Visit: Payer: Self-pay

## 2022-07-28 DIAGNOSIS — R42 Dizziness and giddiness: Secondary | ICD-10-CM | POA: Insufficient documentation

## 2022-07-28 DIAGNOSIS — N3946 Mixed incontinence: Secondary | ICD-10-CM | POA: Insufficient documentation

## 2022-07-28 DIAGNOSIS — Z1152 Encounter for screening for COVID-19: Secondary | ICD-10-CM | POA: Insufficient documentation

## 2022-07-28 DIAGNOSIS — R1110 Vomiting, unspecified: Secondary | ICD-10-CM | POA: Insufficient documentation

## 2022-07-28 DIAGNOSIS — O24419 Gestational diabetes mellitus in pregnancy, unspecified control: Secondary | ICD-10-CM | POA: Insufficient documentation

## 2022-07-28 LAB — COMPREHENSIVE METABOLIC PANEL
ALT: 29 U/L (ref 0–44)
AST: 20 U/L (ref 15–41)
Albumin: 4.5 g/dL (ref 3.5–5.0)
Alkaline Phosphatase: 54 U/L (ref 38–126)
Anion gap: 10 (ref 5–15)
BUN: 10 mg/dL (ref 6–20)
CO2: 26 mmol/L (ref 22–32)
Calcium: 9.4 mg/dL (ref 8.9–10.3)
Chloride: 102 mmol/L (ref 98–111)
Creatinine, Ser: 0.93 mg/dL (ref 0.44–1.00)
GFR, Estimated: >60 mL/min (ref >60)
Glucose, Bld: 117 mg/dL — ABNORMAL HIGH (ref 70–99)
Potassium: 3.8 mmol/L (ref 3.5–5.1)
Sodium: 138 mmol/L (ref 135–145)
Total Bilirubin: 0.7 mg/dL (ref 0.3–1.2)
Total Protein: 8.9 g/dL — ABNORMAL HIGH (ref 6.5–8.1)

## 2022-07-28 LAB — CBC
HCT: 43.5 % (ref 36.0–46.0)
Hemoglobin: 14.1 g/dL (ref 12.0–15.0)
MCH: 29.1 pg (ref 26.0–34.0)
MCHC: 32.4 g/dL (ref 30.0–36.0)
MCV: 89.7 fL (ref 80.0–100.0)
Platelets: 357 10*3/uL (ref 150–400)
RBC: 4.85 MIL/uL (ref 3.87–5.11)
RDW: 14.1 % (ref 11.5–15.5)
WBC: 13.2 10*3/uL — ABNORMAL HIGH (ref 4.0–10.5)
nRBC: 0 % (ref 0.0–0.2)

## 2022-07-28 LAB — RESP PANEL BY RT-PCR (RSV, FLU A&B, COVID)  RVPGX2
Influenza A by PCR: NEGATIVE
Influenza B by PCR: NEGATIVE
Resp Syncytial Virus by PCR: NEGATIVE
SARS Coronavirus 2 by RT PCR: NEGATIVE

## 2022-07-28 LAB — CBG MONITORING, ED: Glucose-Capillary: 65 mg/dL — ABNORMAL LOW (ref 70–99)

## 2022-07-28 LAB — HCG, SERUM, QUALITATIVE: Preg, Serum: NEGATIVE

## 2022-07-28 LAB — LIPASE, BLOOD: Lipase: 27 U/L (ref 11–51)

## 2022-07-28 LAB — MAGNESIUM: Magnesium: 2 mg/dL (ref 1.7–2.4)

## 2022-07-28 MED ORDER — DIAZEPAM 5 MG/ML IJ SOLN
2.5000 mg | Freq: Once | INTRAMUSCULAR | Status: AC
  Administered 2022-07-28: 2.5 mg via INTRAVENOUS
  Filled 2022-07-28: qty 2

## 2022-07-28 MED ORDER — DEXTROSE 50 % IV SOLN
INTRAVENOUS | Status: AC
  Administered 2022-07-28: 50 mL
  Filled 2022-07-28: qty 50

## 2022-07-28 MED ORDER — DIPHENHYDRAMINE HCL 50 MG/ML IJ SOLN
25.0000 mg | Freq: Once | INTRAMUSCULAR | Status: AC
  Administered 2022-07-28: 25 mg via INTRAVENOUS

## 2022-07-28 MED ORDER — SCOPOLAMINE 1 MG/3DAYS TD PT72: 1.0000 | MEDICATED_PATCH | TRANSDERMAL | 12 refills | Status: DC

## 2022-07-28 MED ORDER — SCOPOLAMINE 1 MG/3DAYS TD PT72
1.0000 | MEDICATED_PATCH | TRANSDERMAL | Status: DC
  Filled 2022-07-28: qty 1

## 2022-07-28 MED ORDER — DIAZEPAM 2 MG PO TABS
2.0000 mg | ORAL_TABLET | Freq: Once | ORAL | Status: DC
  Filled 2022-07-28: qty 1

## 2022-07-28 MED ORDER — DIPHENHYDRAMINE HCL 50 MG/ML IJ SOLN
INTRAMUSCULAR | Status: AC
  Administered 2022-07-28: 25 mg
  Filled 2022-07-28: qty 1

## 2022-07-28 MED ORDER — ONDANSETRON 4 MG PO TBDP: 4.0000 mg | ORAL_TABLET | Freq: Three times a day (TID) | ORAL | 0 refills | Status: DC | PRN

## 2022-07-28 MED ORDER — ONDANSETRON HCL 4 MG/2ML IJ SOLN
4.0000 mg | Freq: Once | INTRAMUSCULAR | Status: AC | PRN
  Administered 2022-07-28: 4 mg via INTRAVENOUS
  Filled 2022-07-28: qty 2

## 2022-07-28 MED ORDER — LACTATED RINGERS IV BOLUS
1000.0000 mL | Freq: Once | INTRAVENOUS | Status: AC
  Administered 2022-07-28: 1000 mL via INTRAVENOUS

## 2022-07-28 MED ORDER — MECLIZINE HCL 25 MG PO TABS
25.0000 mg | ORAL_TABLET | Freq: Once | ORAL | Status: DC
  Filled 2022-07-28: qty 1

## 2022-07-28 MED ORDER — DIAZEPAM 2 MG PO TABS
2.0000 mg | ORAL_TABLET | Freq: Once | ORAL | Status: AC
  Administered 2022-07-28: 2 mg via ORAL
  Filled 2022-07-28: qty 1

## 2022-07-28 MED ORDER — DEXTROSE 10 % IV SOLN: INTRAVENOUS | Status: DC

## 2022-07-28 MED ORDER — MECLIZINE HCL 25 MG PO TABS: 25.0000 mg | ORAL_TABLET | Freq: Three times a day (TID) | ORAL | 0 refills | Status: DC | PRN

## 2022-07-28 MED ORDER — DEXTROSE 50 % IV SOLN
1.0000 | Freq: Once | INTRAVENOUS | Status: DC
  Filled 2022-07-28: qty 50

## 2022-07-28 MED ORDER — PROCHLORPERAZINE EDISYLATE 10 MG/2ML IJ SOLN
10.0000 mg | Freq: Once | INTRAMUSCULAR | Status: AC
  Administered 2022-07-28: 10 mg via INTRAVENOUS
  Filled 2022-07-28: qty 2

## 2022-07-28 NOTE — ED Notes (Signed)
PA, Alex, notified of patients hypoglycemia and inability to tolerate PO juice.

## 2022-07-28 NOTE — ED Notes (Signed)
Discharge paperwork reviewed entirely with patient, including Rx's and follow up care. Pain was under control. Pt verbalized understanding as well as all parties involved. No questions or concerns voiced at the time of discharge. No acute distress noted.   Pt ambulated out to PVA without incident or assistance.  

## 2022-07-28 NOTE — ED Notes (Signed)
Pt sitting straight up in bed, very anxious and feels like she is about to jump out of her skin. Crying EDP at bedside

## 2022-07-28 NOTE — ED Notes (Signed)
Patient was ambulated from her room. Patient was very dizzy and was unable to walk without holding onto a rail. Also was only able to walk about 10 feet before having to turn around and go back to the room.

## 2022-07-28 NOTE — Discharge Instructions (Addendum)
Thank you for coming to [facility] Emergency Department. You were seen for vertigo/nausea/vomiting. We did an exam, labs, and these showed no acute findings. Please track your sugars carefully and return to ED if you cannot eat/drink anything. We have prescribed scopolamine (patch behind your ear), meclizine 25 mg 3 times per day as needed, and zofran 4 mg under the tongue every 6-8 as needed for nausea. Please follow up with your primary care provider within 1 week.   Do not hesitate to return to the ED or call 911 if you experience: -Worsening symptoms -Numbness/tingling, asymmetric weakness, trouble speaking/swallowing -Lightheadedness, passing out -Fevers/chills -Anything else that concerns you

## 2022-07-28 NOTE — ED Triage Notes (Signed)
Pt reports getting home from work yesterday and feeling dizzy. Pt awoke this morning feeling dizzy and started vomiting. Pt reports vomiting d/t room spinning. Denies history of vertigo. Pt attempted to take meclizine at home but vomited immediately after.

## 2022-07-28 NOTE — ED Provider Notes (Signed)
[facility] Provider Note   CSN: [id number] Arrival date & time: 07/28/22  1817     History {Add pertinent medical, surgical, social history, OB history to HPI:1} Chief Complaint  Patient presents with  . Vomiting  . Dizziness    [name] Blanchard is a 32 y.o. female with h/o gallstone necrotizing pancreatitis, mesenteric vein thrombosis, obesity, gestational diabetes mellitus, pancreatic pseudocyst, mixed urinary incontinence who presents with vomiting/dizziness.   Pt reports getting home from work yesterday and feeling dizzy with room spinning sensation. Pt awoke this morning feeling dizzy and started vomiting d/t room spinning. Denies history of vertigo. Pt attempted to take her boyfriend's meclizine at home but vomited immediately after. Also tried benadryl which didn't help. Has not had any asymptomatic periods, the symptoms have been constant all day but are improved with sitting still and visually fixating.  Symptoms are much worse with movement and movement of the head. Has had vomiting but no real nausea, just dizziness, no abdominal pain. No h/o vertigo. Denies ear pain, changes to her hearing, tinnitus.  Denies any numbness tingling, asymmetric weakness, trouble speaking or swallowing.  Denies any visual changes other than the room spinning, no double vision or blurry vision.  Had abdominal pain, N/V/D two weeks ago and presented to ED. Had CT abd pelvis that showed just hepatic steatosis, and labs showed mild transaminitis. Was given GI f/u.    Dizziness      Home Medications Prior to Admission medications   Medication Sig Start Date End Date Taking? Authorizing Provider  acetaminophen (TYLENOL) 325 MG tablet Take 2 tablets (650 mg total) by mouth every 6 (six) hours as needed. 09/21/21   Carlyon Shadow, MD  docusate sodium (COLACE) 100 MG capsule Take 1 capsule (100 mg total) by mouth daily. 09/22/21   Carlyon Shadow,  MD  ferrous sulfate 325 (65 FE) MG tablet Take 1 tablet (325 mg total) by mouth every other day. 09/23/21   Carlyon Shadow, MD  ibuprofen (ADVIL) 600 MG tablet Take 1 tablet (600 mg total) by mouth every 6 (six) hours. 09/21/21   Carlyon Shadow, MD  metFORMIN (GLUCOPHAGE) 500 MG tablet Take 1 tablet (500 mg total) by mouth 2 (two) times daily with a meal. 09/21/21   Carlyon Shadow, MD  ondansetron (ZOFRAN) 8 MG tablet Take 1 tablet (8 mg total) by mouth every 8 (eight) hours as needed for nausea or vomiting. 07/12/22   Dorie Rank, MD  oxyCODONE (OXY IR/ROXICODONE) 5 MG immediate release tablet Take 1 tablet (5 mg total) by mouth every 4 (four) hours as needed for moderate pain. 09/21/21   Carlyon Shadow, MD  Prenatal Vit-Fe Fumarate-FA (PRENATAL MULTIVITAMIN) TABS tablet Take 1 tablet by mouth daily at 12 noon.    [provider]      Allergies    Morphine, Ativan [lorazepam], and Reglan [metoclopramide]    Review of Systems   Review of Systems  Neurological:  Positive for dizziness.   Review of systems Negative for f/c.  A 10 point review of systems was performed and is negative unless otherwise reported in HPI.  Physical Exam Updated Vital Signs BP 125/83 (BP Location: Left Arm)   Pulse 77   Temp 98 F (36.7 C)   Resp 18   Ht 5\' 2"  (1.575 m)   Wt 92.5 kg   SpO2 100%   BMI 37.31 kg/m  Physical Exam General: Very uncomfortable appearing female,  sitting in bed sitting very still.  HEENT: PERRLA, EOMI, unidirectional bilateral horizontal nystagmus. Sclera anicteric, MMM, trachea midline. No ear foreign bodies, unremarkable external ears and ear canals, normal cones of light on TMs bilaterally. No effusions. Cardiology: RRR, no murmurs/rubs/gallops. BL radial and DP pulses equal bilaterally.  Resp: Normal respiratory rate and effort. CTAB, no wheezes, rhonchi, crackles.  Abd: Soft, non-tender, non-distended. No rebound tenderness or guarding.  GU: Deferred. MSK:  No peripheral edema or signs of trauma. Extremities without deformity or TTP. No cyanosis or clubbing. Skin: warm, dry.  Neuro: A&Ox4, CNs II-XII grossly intact. MAEs. Sensation grossly intact.  Psych: Normal mood and affect.   ED Results / Procedures / Treatments   Labs (all labs ordered are listed, but only abnormal results are displayed) Labs Reviewed  COMPREHENSIVE METABOLIC PANEL - Abnormal; Notable for the following components:      Result Value   Glucose, Bld 117 (*)    Total Protein 8.9 (*)    All other components within normal limits  CBC - Abnormal; Notable for the following components:   WBC 13.2 (*)    All other components within normal limits  CBG MONITORING, ED - Abnormal; Notable for the following components:   Glucose-Capillary 65 (*)    All other components within normal limits  RESP PANEL BY RT-PCR (RSV, FLU A&B, COVID)  RVPGX2  LIPASE, BLOOD  MAGNESIUM  URINALYSIS, ROUTINE W REFLEX MICROSCOPIC  HCG, SERUM, QUALITATIVE    EKG EKG Interpretation  Date/Time:  Monday July 28 2022 18:44:29 EDT Ventricular Rate:  73 PR Interval:  138 QRS Duration: 81 QT Interval:  372 QTC Calculation: 410 R Axis:   89 Text Interpretation: Sinus rhythm Confirmed by Cindee Lame [phone]) on 07/28/2022 7:07:04 PM  Radiology No results found.  Procedures Procedures  {Document cardiac monitor, telemetry assessment procedure when appropriate:1}  Medications Ordered in ED Medications  dextrose 10 % infusion ( Intravenous Not Given 07/28/22 1947)  meclizine (ANTIVERT) tablet 25 mg (has no administration in time range)  lactated ringers bolus 1,000 mL (has no administration in time range)  scopolamine (TRANSDERM-SCOP) 1 MG/3DAYS 1.5 mg (has no administration in time range)  diazepam (VALIUM) tablet 2 mg (has no administration in time range)  ondansetron (ZOFRAN) injection 4 mg (4 mg Intravenous Given 07/28/22 1857)  diazepam (VALIUM) tablet 2 mg (2 mg Oral Given 07/28/22 1947)   dextrose 50 % solution (50 mLs  Given 07/28/22 1946)    ED Course/ Medical Decision Making/ A&P                          Medical Decision Making Amount and/or Complexity of Data Reviewed Labs: ordered. Decision-making details documented in ED Course.  Risk Prescription drug management.    This patient presents to the ED for concern of vertigo, this involves an extensive number of treatment options, and is a complaint that carries with it a high risk of complications and morbidity.  I considered the following differential and admission for this acute, potentially life threatening condition.   MDM:    *** HINTS exam:  -Head impulse test *** (When the head is turned towards the normal side, the vestibular ocular reflex remains intact and eyes continue to fixate on the visual target     When the head is turned towards the affected side, the vestibular ocular reflex fails and the eyes make a corrective saccade to re-fixate on the visual target [8][9]  Normally, a functional vestibular system will identify any movement of the head position and rapidly correct eye movement accordingly so that the center of the vision remains on a target.         This reflex fails in peripheral causes of vertigo affecting the vestibulocochlear nerve     It is reassuring if the reflex is abnormal (due to dysfunction of the peripheral nerve) -Nystagmus - unidirectional and horizontal which is reassuring against central cause -Test of skew - ***  Consider vestibular neuritis given suppression with visual fixation but no succinct asymptomatic periods like would be expected with BPPV. No auditory symptoms to indicate labyrinthitis or meniere's disease. Unremarkable bilateral otoscopic exam, no foreign body noted in ear canal. Other causes include arrhythmia (EKG without abnormalities), anemia, hyperviscosity syndrome, toxic (alcohol), metabolic such as thyroid disease or hypoglycemia.   Clinical Course as of  07/28/22 2122  Mon Jul 28, 2022  1906 Glucose-Capillary(!): 65 [HN]  1933 WBC(!): 13.2 [HN]  2025 Lipase: 27 [HN]  2025 Comprehensive metabolic panel(!) Unremarkable, glucose now 117 [HN]  2025 Some improvement in symptoms with valium, reports she feels the room spinning has slowed but still present. [HN]  2120 Patient acutely tearful, anxious, feeling like panicking after administration of IV compazine. Had dystonic reaction similar to this with reglan. Will give IV benadryl and diazepam.  [HN]    Clinical Course User Index [HN] Audley Hose, MD    Labs: I Ordered, and personally interpreted labs.  The pertinent results include:  those listed above  Additional history obtained from chart review, friend at bedside  Reevaluation: After the interventions noted above, I reevaluated the patient and found that they have :{resolved/improved/worsened:23923::"improved"}  Social Determinants of Health: .***  Disposition:  ***  Co morbidities that complicate the patient evaluation . Past Medical History:  Diagnosis Date  . Anxiety    diagnosed in 2012  . Dysmenorrhea   . Gestational diabetes   . Pancreatitis    necrotizing pancreatitis w/ sepsis     Medicines Meds ordered this encounter  Medications  . ondansetron (ZOFRAN) injection 4 mg  . DISCONTD: dextrose 50 % solution 50 mL  . dextrose 10 % infusion  . diazepam (VALIUM) tablet 2 mg  . dextrose 50 % solution    Markus Daft: cabinet override  . meclizine (ANTIVERT) tablet 25 mg  . lactated ringers bolus 1,000 mL  . scopolamine (TRANSDERM-SCOP) 1 MG/3DAYS 1.5 mg  . diazepam (VALIUM) tablet 2 mg    I have reviewed the patients home medicines and have made adjustments as needed  Problem List / ED Course: Problem List Items Addressed This Visit   None        {Document critical care time when appropriate:1} {Document review of labs and clinical decision tools ie heart score, Chads2Vasc2 etc:1}   {Document your independent review of radiology images, and any outside records:1} {Document your discussion with family members, caretakers, and with consultants:1} {Document social determinants of health affecting pt's care:1} {Document your decision making why or why not admission, treatments were needed:1}  This note was created using dictation software, which may contain spelling or grammatical errors.

## 2022-07-30 DIAGNOSIS — R42 Dizziness and giddiness: Secondary | ICD-10-CM | POA: Diagnosis not present

## 2022-07-30 DIAGNOSIS — N926 Irregular menstruation, unspecified: Secondary | ICD-10-CM | POA: Diagnosis not present

## 2022-07-30 DIAGNOSIS — E1169 Type 2 diabetes mellitus with other specified complication: Secondary | ICD-10-CM | POA: Diagnosis not present

## 2022-07-30 DIAGNOSIS — J45909 Unspecified asthma, uncomplicated: Secondary | ICD-10-CM | POA: Diagnosis not present

## 2022-07-30 DIAGNOSIS — R7989 Other specified abnormal findings of blood chemistry: Secondary | ICD-10-CM | POA: Diagnosis not present

## 2022-07-30 DIAGNOSIS — E669 Obesity, unspecified: Secondary | ICD-10-CM | POA: Diagnosis not present

## 2022-07-30 DIAGNOSIS — Z6837 Body mass index (BMI) 37.0-37.9, adult: Secondary | ICD-10-CM | POA: Diagnosis not present

## 2022-07-30 DIAGNOSIS — J309 Allergic rhinitis, unspecified: Secondary | ICD-10-CM | POA: Diagnosis not present

## 2022-07-31 DIAGNOSIS — F411 Generalized anxiety disorder: Secondary | ICD-10-CM | POA: Diagnosis not present

## 2022-07-31 DIAGNOSIS — F900 Attention-deficit hyperactivity disorder, predominantly inattentive type: Secondary | ICD-10-CM | POA: Diagnosis not present

## 2022-07-31 DIAGNOSIS — F4312 Post-traumatic stress disorder, chronic: Secondary | ICD-10-CM | POA: Diagnosis not present

## 2022-08-02 DIAGNOSIS — R42 Dizziness and giddiness: Secondary | ICD-10-CM | POA: Diagnosis not present

## 2022-08-02 DIAGNOSIS — N926 Irregular menstruation, unspecified: Secondary | ICD-10-CM | POA: Diagnosis not present

## 2022-08-02 DIAGNOSIS — R5383 Other fatigue: Secondary | ICD-10-CM | POA: Diagnosis not present

## 2022-08-02 DIAGNOSIS — Z6837 Body mass index (BMI) 37.0-37.9, adult: Secondary | ICD-10-CM | POA: Diagnosis not present

## 2022-08-02 DIAGNOSIS — R7989 Other specified abnormal findings of blood chemistry: Secondary | ICD-10-CM | POA: Diagnosis not present

## 2022-08-02 DIAGNOSIS — E1169 Type 2 diabetes mellitus with other specified complication: Secondary | ICD-10-CM | POA: Diagnosis not present

## 2022-08-02 DIAGNOSIS — E669 Obesity, unspecified: Secondary | ICD-10-CM | POA: Diagnosis not present

## 2022-08-02 DIAGNOSIS — J309 Allergic rhinitis, unspecified: Secondary | ICD-10-CM | POA: Diagnosis not present

## 2022-08-02 DIAGNOSIS — J45909 Unspecified asthma, uncomplicated: Secondary | ICD-10-CM | POA: Diagnosis not present

## 2022-08-08 DIAGNOSIS — M9901 Segmental and somatic dysfunction of cervical region: Secondary | ICD-10-CM | POA: Diagnosis not present

## 2022-08-08 DIAGNOSIS — M9904 Segmental and somatic dysfunction of sacral region: Secondary | ICD-10-CM | POA: Diagnosis not present

## 2022-08-08 DIAGNOSIS — M9905 Segmental and somatic dysfunction of pelvic region: Secondary | ICD-10-CM | POA: Diagnosis not present

## 2022-08-08 DIAGNOSIS — M9903 Segmental and somatic dysfunction of lumbar region: Secondary | ICD-10-CM | POA: Diagnosis not present

## 2022-08-08 DIAGNOSIS — M9902 Segmental and somatic dysfunction of thoracic region: Secondary | ICD-10-CM | POA: Diagnosis not present

## 2022-08-08 DIAGNOSIS — M7918 Myalgia, other site: Secondary | ICD-10-CM | POA: Diagnosis not present

## 2022-08-08 DIAGNOSIS — M5117 Intervertebral disc disorders with radiculopathy, lumbosacral region: Secondary | ICD-10-CM | POA: Diagnosis not present

## 2022-08-13 DIAGNOSIS — F4312 Post-traumatic stress disorder, chronic: Secondary | ICD-10-CM | POA: Diagnosis not present

## 2022-08-13 DIAGNOSIS — F411 Generalized anxiety disorder: Secondary | ICD-10-CM | POA: Diagnosis not present

## 2022-08-13 DIAGNOSIS — F900 Attention-deficit hyperactivity disorder, predominantly inattentive type: Secondary | ICD-10-CM | POA: Diagnosis not present

## 2022-08-14 DIAGNOSIS — E1169 Type 2 diabetes mellitus with other specified complication: Secondary | ICD-10-CM | POA: Diagnosis not present

## 2022-08-14 DIAGNOSIS — E669 Obesity, unspecified: Secondary | ICD-10-CM | POA: Diagnosis not present

## 2022-08-14 DIAGNOSIS — N926 Irregular menstruation, unspecified: Secondary | ICD-10-CM | POA: Diagnosis not present

## 2022-08-14 DIAGNOSIS — R7989 Other specified abnormal findings of blood chemistry: Secondary | ICD-10-CM | POA: Diagnosis not present

## 2022-08-14 DIAGNOSIS — J45909 Unspecified asthma, uncomplicated: Secondary | ICD-10-CM | POA: Diagnosis not present

## 2022-08-14 DIAGNOSIS — R42 Dizziness and giddiness: Secondary | ICD-10-CM | POA: Diagnosis not present

## 2022-08-14 DIAGNOSIS — Z6836 Body mass index (BMI) 36.0-36.9, adult: Secondary | ICD-10-CM | POA: Diagnosis not present

## 2022-08-14 DIAGNOSIS — J309 Allergic rhinitis, unspecified: Secondary | ICD-10-CM | POA: Diagnosis not present

## 2022-08-15 ENCOUNTER — Other Ambulatory Visit (HOSPITAL_COMMUNITY): Payer: Self-pay

## 2022-08-15 DIAGNOSIS — M9902 Segmental and somatic dysfunction of thoracic region: Secondary | ICD-10-CM | POA: Diagnosis not present

## 2022-08-15 DIAGNOSIS — M5117 Intervertebral disc disorders with radiculopathy, lumbosacral region: Secondary | ICD-10-CM | POA: Diagnosis not present

## 2022-08-15 DIAGNOSIS — M9903 Segmental and somatic dysfunction of lumbar region: Secondary | ICD-10-CM | POA: Diagnosis not present

## 2022-08-15 DIAGNOSIS — M9905 Segmental and somatic dysfunction of pelvic region: Secondary | ICD-10-CM | POA: Diagnosis not present

## 2022-08-15 DIAGNOSIS — M7918 Myalgia, other site: Secondary | ICD-10-CM | POA: Diagnosis not present

## 2022-08-15 DIAGNOSIS — M9901 Segmental and somatic dysfunction of cervical region: Secondary | ICD-10-CM | POA: Diagnosis not present

## 2022-08-15 DIAGNOSIS — M9904 Segmental and somatic dysfunction of sacral region: Secondary | ICD-10-CM | POA: Diagnosis not present

## 2022-08-15 MED ORDER — HUMULIN N KWIKPEN 100 UNIT/ML ~~LOC~~ SUPN
10.0000 [IU] | PEN_INJECTOR | Freq: Every day | SUBCUTANEOUS | 0 refills | Status: DC
  Filled 2022-08-15: qty 3, 30d supply, fill #0

## 2022-08-18 DIAGNOSIS — F900 Attention-deficit hyperactivity disorder, predominantly inattentive type: Secondary | ICD-10-CM | POA: Diagnosis not present

## 2022-08-18 DIAGNOSIS — F411 Generalized anxiety disorder: Secondary | ICD-10-CM | POA: Diagnosis not present

## 2022-08-18 DIAGNOSIS — F4312 Post-traumatic stress disorder, chronic: Secondary | ICD-10-CM | POA: Diagnosis not present

## 2022-08-21 DIAGNOSIS — M9905 Segmental and somatic dysfunction of pelvic region: Secondary | ICD-10-CM | POA: Diagnosis not present

## 2022-08-21 DIAGNOSIS — M5117 Intervertebral disc disorders with radiculopathy, lumbosacral region: Secondary | ICD-10-CM | POA: Diagnosis not present

## 2022-08-21 DIAGNOSIS — M9904 Segmental and somatic dysfunction of sacral region: Secondary | ICD-10-CM | POA: Diagnosis not present

## 2022-08-21 DIAGNOSIS — M9903 Segmental and somatic dysfunction of lumbar region: Secondary | ICD-10-CM | POA: Diagnosis not present

## 2022-08-21 DIAGNOSIS — M7918 Myalgia, other site: Secondary | ICD-10-CM | POA: Diagnosis not present

## 2022-08-21 DIAGNOSIS — M9902 Segmental and somatic dysfunction of thoracic region: Secondary | ICD-10-CM | POA: Diagnosis not present

## 2022-08-21 DIAGNOSIS — M9901 Segmental and somatic dysfunction of cervical region: Secondary | ICD-10-CM | POA: Diagnosis not present

## 2022-08-27 DIAGNOSIS — F411 Generalized anxiety disorder: Secondary | ICD-10-CM | POA: Diagnosis not present

## 2022-08-27 DIAGNOSIS — F900 Attention-deficit hyperactivity disorder, predominantly inattentive type: Secondary | ICD-10-CM | POA: Diagnosis not present

## 2022-08-27 DIAGNOSIS — F4312 Post-traumatic stress disorder, chronic: Secondary | ICD-10-CM | POA: Diagnosis not present

## 2022-08-29 ENCOUNTER — Other Ambulatory Visit (HOSPITAL_COMMUNITY): Payer: Self-pay

## 2022-08-29 MED ORDER — FREESTYLE LIBRE 3 SENSOR MISC
5 refills | Status: DC
  Filled 2022-08-29: qty 2, 28d supply, fill #0
  Filled 2022-09-15 – 2022-09-25 (×2): qty 2, 28d supply, fill #1
  Filled 2022-10-27: qty 2, 28d supply, fill #2
  Filled 2022-11-19: qty 2, 28d supply, fill #3
  Filled 2022-12-25: qty 2, 28d supply, fill #4
  Filled 2023-01-17: qty 2, 28d supply, fill #5

## 2022-08-29 MED ORDER — LOMAIRA 8 MG PO TABS
8.0000 mg | ORAL_TABLET | Freq: Every day | ORAL | 4 refills | Status: DC
  Filled 2022-08-29 – 2022-10-08 (×4): qty 30, 30d supply, fill #0
  Filled 2022-11-19: qty 30, 30d supply, fill #1
  Filled 2022-12-25: qty 30, 30d supply, fill #2
  Filled 2023-02-17: qty 30, 30d supply, fill #3

## 2022-08-30 ENCOUNTER — Other Ambulatory Visit (HOSPITAL_COMMUNITY): Payer: Self-pay

## 2022-09-01 ENCOUNTER — Other Ambulatory Visit (HOSPITAL_COMMUNITY): Payer: Self-pay

## 2022-09-01 DIAGNOSIS — F411 Generalized anxiety disorder: Secondary | ICD-10-CM | POA: Diagnosis not present

## 2022-09-01 DIAGNOSIS — F4312 Post-traumatic stress disorder, chronic: Secondary | ICD-10-CM | POA: Diagnosis not present

## 2022-09-01 DIAGNOSIS — F900 Attention-deficit hyperactivity disorder, predominantly inattentive type: Secondary | ICD-10-CM | POA: Diagnosis not present

## 2022-09-03 DIAGNOSIS — J309 Allergic rhinitis, unspecified: Secondary | ICD-10-CM | POA: Diagnosis not present

## 2022-09-03 DIAGNOSIS — J069 Acute upper respiratory infection, unspecified: Secondary | ICD-10-CM | POA: Diagnosis not present

## 2022-09-03 DIAGNOSIS — M7918 Myalgia, other site: Secondary | ICD-10-CM | POA: Diagnosis not present

## 2022-09-03 DIAGNOSIS — Z6836 Body mass index (BMI) 36.0-36.9, adult: Secondary | ICD-10-CM | POA: Diagnosis not present

## 2022-09-03 DIAGNOSIS — M9902 Segmental and somatic dysfunction of thoracic region: Secondary | ICD-10-CM | POA: Diagnosis not present

## 2022-09-03 DIAGNOSIS — R7989 Other specified abnormal findings of blood chemistry: Secondary | ICD-10-CM | POA: Diagnosis not present

## 2022-09-03 DIAGNOSIS — M9901 Segmental and somatic dysfunction of cervical region: Secondary | ICD-10-CM | POA: Diagnosis not present

## 2022-09-03 DIAGNOSIS — M9904 Segmental and somatic dysfunction of sacral region: Secondary | ICD-10-CM | POA: Diagnosis not present

## 2022-09-03 DIAGNOSIS — R42 Dizziness and giddiness: Secondary | ICD-10-CM | POA: Diagnosis not present

## 2022-09-03 DIAGNOSIS — M9903 Segmental and somatic dysfunction of lumbar region: Secondary | ICD-10-CM | POA: Diagnosis not present

## 2022-09-03 DIAGNOSIS — M9905 Segmental and somatic dysfunction of pelvic region: Secondary | ICD-10-CM | POA: Diagnosis not present

## 2022-09-03 DIAGNOSIS — E669 Obesity, unspecified: Secondary | ICD-10-CM | POA: Diagnosis not present

## 2022-09-03 DIAGNOSIS — N926 Irregular menstruation, unspecified: Secondary | ICD-10-CM | POA: Diagnosis not present

## 2022-09-03 DIAGNOSIS — E1169 Type 2 diabetes mellitus with other specified complication: Secondary | ICD-10-CM | POA: Diagnosis not present

## 2022-09-03 DIAGNOSIS — J45909 Unspecified asthma, uncomplicated: Secondary | ICD-10-CM | POA: Diagnosis not present

## 2022-09-03 DIAGNOSIS — M5117 Intervertebral disc disorders with radiculopathy, lumbosacral region: Secondary | ICD-10-CM | POA: Diagnosis not present

## 2022-09-15 ENCOUNTER — Other Ambulatory Visit (HOSPITAL_COMMUNITY): Payer: Self-pay

## 2022-09-15 DIAGNOSIS — F900 Attention-deficit hyperactivity disorder, predominantly inattentive type: Secondary | ICD-10-CM | POA: Diagnosis not present

## 2022-09-15 DIAGNOSIS — F4312 Post-traumatic stress disorder, chronic: Secondary | ICD-10-CM | POA: Diagnosis not present

## 2022-09-15 DIAGNOSIS — F411 Generalized anxiety disorder: Secondary | ICD-10-CM | POA: Diagnosis not present

## 2022-09-16 ENCOUNTER — Other Ambulatory Visit: Payer: Self-pay

## 2022-09-16 ENCOUNTER — Other Ambulatory Visit (HOSPITAL_COMMUNITY): Payer: Self-pay

## 2022-09-16 DIAGNOSIS — M9904 Segmental and somatic dysfunction of sacral region: Secondary | ICD-10-CM | POA: Diagnosis not present

## 2022-09-16 DIAGNOSIS — M9902 Segmental and somatic dysfunction of thoracic region: Secondary | ICD-10-CM | POA: Diagnosis not present

## 2022-09-16 DIAGNOSIS — M9901 Segmental and somatic dysfunction of cervical region: Secondary | ICD-10-CM | POA: Diagnosis not present

## 2022-09-16 DIAGNOSIS — M9903 Segmental and somatic dysfunction of lumbar region: Secondary | ICD-10-CM | POA: Diagnosis not present

## 2022-09-16 DIAGNOSIS — M7918 Myalgia, other site: Secondary | ICD-10-CM | POA: Diagnosis not present

## 2022-09-16 DIAGNOSIS — M9905 Segmental and somatic dysfunction of pelvic region: Secondary | ICD-10-CM | POA: Diagnosis not present

## 2022-09-16 DIAGNOSIS — M5117 Intervertebral disc disorders with radiculopathy, lumbosacral region: Secondary | ICD-10-CM | POA: Diagnosis not present

## 2022-09-16 MED ORDER — HUMULIN N KWIKPEN 100 UNIT/ML ~~LOC~~ SUPN
10.0000 [IU] | PEN_INJECTOR | Freq: Every evening | SUBCUTANEOUS | 5 refills | Status: DC
  Filled 2022-09-16: qty 9, 90d supply, fill #0
  Filled 2022-09-25: qty 9, 84d supply, fill #0

## 2022-09-17 ENCOUNTER — Other Ambulatory Visit: Payer: Self-pay

## 2022-09-18 DIAGNOSIS — F900 Attention-deficit hyperactivity disorder, predominantly inattentive type: Secondary | ICD-10-CM | POA: Diagnosis not present

## 2022-09-18 DIAGNOSIS — F4312 Post-traumatic stress disorder, chronic: Secondary | ICD-10-CM | POA: Diagnosis not present

## 2022-09-18 DIAGNOSIS — F411 Generalized anxiety disorder: Secondary | ICD-10-CM | POA: Diagnosis not present

## 2022-09-24 ENCOUNTER — Other Ambulatory Visit (HOSPITAL_COMMUNITY): Payer: Self-pay

## 2022-09-25 ENCOUNTER — Other Ambulatory Visit (HOSPITAL_COMMUNITY): Payer: Self-pay

## 2022-09-29 ENCOUNTER — Other Ambulatory Visit (HOSPITAL_COMMUNITY): Payer: Self-pay

## 2022-09-29 DIAGNOSIS — F900 Attention-deficit hyperactivity disorder, predominantly inattentive type: Secondary | ICD-10-CM | POA: Diagnosis not present

## 2022-09-29 DIAGNOSIS — F4312 Post-traumatic stress disorder, chronic: Secondary | ICD-10-CM | POA: Diagnosis not present

## 2022-09-29 DIAGNOSIS — F411 Generalized anxiety disorder: Secondary | ICD-10-CM | POA: Diagnosis not present

## 2022-09-30 DIAGNOSIS — M9902 Segmental and somatic dysfunction of thoracic region: Secondary | ICD-10-CM | POA: Diagnosis not present

## 2022-09-30 DIAGNOSIS — M9903 Segmental and somatic dysfunction of lumbar region: Secondary | ICD-10-CM | POA: Diagnosis not present

## 2022-09-30 DIAGNOSIS — M9905 Segmental and somatic dysfunction of pelvic region: Secondary | ICD-10-CM | POA: Diagnosis not present

## 2022-09-30 DIAGNOSIS — M7918 Myalgia, other site: Secondary | ICD-10-CM | POA: Diagnosis not present

## 2022-09-30 DIAGNOSIS — M5117 Intervertebral disc disorders with radiculopathy, lumbosacral region: Secondary | ICD-10-CM | POA: Diagnosis not present

## 2022-09-30 DIAGNOSIS — M9904 Segmental and somatic dysfunction of sacral region: Secondary | ICD-10-CM | POA: Diagnosis not present

## 2022-09-30 DIAGNOSIS — M9901 Segmental and somatic dysfunction of cervical region: Secondary | ICD-10-CM | POA: Diagnosis not present

## 2022-10-02 DIAGNOSIS — Z6836 Body mass index (BMI) 36.0-36.9, adult: Secondary | ICD-10-CM | POA: Diagnosis not present

## 2022-10-02 DIAGNOSIS — J309 Allergic rhinitis, unspecified: Secondary | ICD-10-CM | POA: Diagnosis not present

## 2022-10-02 DIAGNOSIS — N926 Irregular menstruation, unspecified: Secondary | ICD-10-CM | POA: Diagnosis not present

## 2022-10-02 DIAGNOSIS — R42 Dizziness and giddiness: Secondary | ICD-10-CM | POA: Diagnosis not present

## 2022-10-02 DIAGNOSIS — J45909 Unspecified asthma, uncomplicated: Secondary | ICD-10-CM | POA: Diagnosis not present

## 2022-10-02 DIAGNOSIS — R7989 Other specified abnormal findings of blood chemistry: Secondary | ICD-10-CM | POA: Diagnosis not present

## 2022-10-02 DIAGNOSIS — E669 Obesity, unspecified: Secondary | ICD-10-CM | POA: Diagnosis not present

## 2022-10-02 DIAGNOSIS — E1169 Type 2 diabetes mellitus with other specified complication: Secondary | ICD-10-CM | POA: Diagnosis not present

## 2022-10-08 ENCOUNTER — Other Ambulatory Visit (HOSPITAL_COMMUNITY): Payer: Self-pay

## 2022-10-09 ENCOUNTER — Other Ambulatory Visit (HOSPITAL_COMMUNITY): Payer: Self-pay

## 2022-10-13 DIAGNOSIS — F411 Generalized anxiety disorder: Secondary | ICD-10-CM | POA: Diagnosis not present

## 2022-10-13 DIAGNOSIS — F900 Attention-deficit hyperactivity disorder, predominantly inattentive type: Secondary | ICD-10-CM | POA: Diagnosis not present

## 2022-10-13 DIAGNOSIS — F4312 Post-traumatic stress disorder, chronic: Secondary | ICD-10-CM | POA: Diagnosis not present

## 2022-10-14 DIAGNOSIS — N912 Amenorrhea, unspecified: Secondary | ICD-10-CM | POA: Diagnosis not present

## 2022-10-14 DIAGNOSIS — N926 Irregular menstruation, unspecified: Secondary | ICD-10-CM | POA: Diagnosis not present

## 2022-10-16 DIAGNOSIS — F411 Generalized anxiety disorder: Secondary | ICD-10-CM | POA: Diagnosis not present

## 2022-10-16 DIAGNOSIS — F4312 Post-traumatic stress disorder, chronic: Secondary | ICD-10-CM | POA: Diagnosis not present

## 2022-10-16 DIAGNOSIS — F900 Attention-deficit hyperactivity disorder, predominantly inattentive type: Secondary | ICD-10-CM | POA: Diagnosis not present

## 2022-10-23 DIAGNOSIS — M9905 Segmental and somatic dysfunction of pelvic region: Secondary | ICD-10-CM | POA: Diagnosis not present

## 2022-10-23 DIAGNOSIS — M9902 Segmental and somatic dysfunction of thoracic region: Secondary | ICD-10-CM | POA: Diagnosis not present

## 2022-10-23 DIAGNOSIS — M9903 Segmental and somatic dysfunction of lumbar region: Secondary | ICD-10-CM | POA: Diagnosis not present

## 2022-10-23 DIAGNOSIS — K859 Acute pancreatitis without necrosis or infection, unspecified: Secondary | ICD-10-CM | POA: Diagnosis not present

## 2022-10-23 DIAGNOSIS — M9901 Segmental and somatic dysfunction of cervical region: Secondary | ICD-10-CM | POA: Diagnosis not present

## 2022-10-23 DIAGNOSIS — E1165 Type 2 diabetes mellitus with hyperglycemia: Secondary | ICD-10-CM | POA: Diagnosis not present

## 2022-10-23 DIAGNOSIS — N926 Irregular menstruation, unspecified: Secondary | ICD-10-CM | POA: Diagnosis not present

## 2022-10-23 DIAGNOSIS — M7918 Myalgia, other site: Secondary | ICD-10-CM | POA: Diagnosis not present

## 2022-10-23 DIAGNOSIS — M5117 Intervertebral disc disorders with radiculopathy, lumbosacral region: Secondary | ICD-10-CM | POA: Diagnosis not present

## 2022-10-23 DIAGNOSIS — M9904 Segmental and somatic dysfunction of sacral region: Secondary | ICD-10-CM | POA: Diagnosis not present

## 2022-10-23 DIAGNOSIS — E669 Obesity, unspecified: Secondary | ICD-10-CM | POA: Diagnosis not present

## 2022-10-27 DIAGNOSIS — F411 Generalized anxiety disorder: Secondary | ICD-10-CM | POA: Diagnosis not present

## 2022-10-27 DIAGNOSIS — F900 Attention-deficit hyperactivity disorder, predominantly inattentive type: Secondary | ICD-10-CM | POA: Diagnosis not present

## 2022-10-27 DIAGNOSIS — F4312 Post-traumatic stress disorder, chronic: Secondary | ICD-10-CM | POA: Diagnosis not present

## 2022-10-31 DIAGNOSIS — N926 Irregular menstruation, unspecified: Secondary | ICD-10-CM | POA: Diagnosis not present

## 2022-11-03 DIAGNOSIS — F411 Generalized anxiety disorder: Secondary | ICD-10-CM | POA: Diagnosis not present

## 2022-11-03 DIAGNOSIS — F4312 Post-traumatic stress disorder, chronic: Secondary | ICD-10-CM | POA: Diagnosis not present

## 2022-11-03 DIAGNOSIS — F900 Attention-deficit hyperactivity disorder, predominantly inattentive type: Secondary | ICD-10-CM | POA: Diagnosis not present

## 2022-11-04 ENCOUNTER — Other Ambulatory Visit: Payer: Self-pay | Admitting: Endocrinology

## 2022-11-04 DIAGNOSIS — E237 Disorder of pituitary gland, unspecified: Secondary | ICD-10-CM

## 2022-11-10 ENCOUNTER — Encounter: Payer: Self-pay | Admitting: Endocrinology

## 2022-11-19 ENCOUNTER — Other Ambulatory Visit: Payer: Self-pay

## 2022-11-19 ENCOUNTER — Other Ambulatory Visit (HOSPITAL_COMMUNITY): Payer: Self-pay

## 2022-11-25 ENCOUNTER — Ambulatory Visit
Admission: RE | Admit: 2022-11-25 | Discharge: 2022-11-25 | Disposition: A | Discharge status: Home / Self Care | Payer: Commercial Managed Care - PPO | Source: Ambulatory Visit | Attending: Endocrinology | Admitting: Endocrinology

## 2022-11-25 DIAGNOSIS — R5383 Other fatigue: Secondary | ICD-10-CM | POA: Diagnosis not present

## 2022-11-25 DIAGNOSIS — E237 Disorder of pituitary gland, unspecified: Secondary | ICD-10-CM

## 2022-11-25 MED ORDER — GADOPICLENOL 0.5 MMOL/ML IV SOLN
9.0000 mL | Freq: Once | INTRAVENOUS | Status: AC | PRN
  Administered 2022-11-25: 9 mL via INTRAVENOUS

## 2022-12-11 DIAGNOSIS — Z6835 Body mass index (BMI) 35.0-35.9, adult: Secondary | ICD-10-CM | POA: Diagnosis not present

## 2022-12-11 DIAGNOSIS — Z01419 Encounter for gynecological examination (general) (routine) without abnormal findings: Secondary | ICD-10-CM | POA: Diagnosis not present

## 2022-12-11 LAB — HM PAP SMEAR: HM Pap smear: NEGATIVE

## 2022-12-12 DIAGNOSIS — E2749 Other adrenocortical insufficiency: Secondary | ICD-10-CM | POA: Diagnosis not present

## 2022-12-12 DIAGNOSIS — K859 Acute pancreatitis without necrosis or infection, unspecified: Secondary | ICD-10-CM | POA: Diagnosis not present

## 2022-12-12 DIAGNOSIS — E669 Obesity, unspecified: Secondary | ICD-10-CM | POA: Diagnosis not present

## 2022-12-12 DIAGNOSIS — F900 Attention-deficit hyperactivity disorder, predominantly inattentive type: Secondary | ICD-10-CM | POA: Diagnosis not present

## 2022-12-12 DIAGNOSIS — E1165 Type 2 diabetes mellitus with hyperglycemia: Secondary | ICD-10-CM | POA: Diagnosis not present

## 2022-12-12 DIAGNOSIS — F411 Generalized anxiety disorder: Secondary | ICD-10-CM | POA: Diagnosis not present

## 2022-12-12 DIAGNOSIS — F4312 Post-traumatic stress disorder, chronic: Secondary | ICD-10-CM | POA: Diagnosis not present

## 2022-12-12 DIAGNOSIS — N926 Irregular menstruation, unspecified: Secondary | ICD-10-CM | POA: Diagnosis not present

## 2022-12-25 ENCOUNTER — Other Ambulatory Visit: Payer: Self-pay

## 2022-12-25 ENCOUNTER — Other Ambulatory Visit (HOSPITAL_COMMUNITY): Payer: Self-pay

## 2022-12-25 DIAGNOSIS — F900 Attention-deficit hyperactivity disorder, predominantly inattentive type: Secondary | ICD-10-CM | POA: Diagnosis not present

## 2022-12-25 DIAGNOSIS — F4312 Post-traumatic stress disorder, chronic: Secondary | ICD-10-CM | POA: Diagnosis not present

## 2022-12-25 DIAGNOSIS — F411 Generalized anxiety disorder: Secondary | ICD-10-CM | POA: Diagnosis not present

## 2022-12-30 ENCOUNTER — Other Ambulatory Visit (HOSPITAL_COMMUNITY): Payer: Self-pay

## 2023-01-01 DIAGNOSIS — E2749 Other adrenocortical insufficiency: Secondary | ICD-10-CM | POA: Diagnosis not present

## 2023-01-13 ENCOUNTER — Telehealth: Payer: Commercial Managed Care - PPO | Admitting: Physician Assistant

## 2023-01-13 ENCOUNTER — Other Ambulatory Visit (HOSPITAL_COMMUNITY): Payer: Self-pay

## 2023-01-13 DIAGNOSIS — J019 Acute sinusitis, unspecified: Secondary | ICD-10-CM

## 2023-01-13 DIAGNOSIS — B9689 Other specified bacterial agents as the cause of diseases classified elsewhere: Secondary | ICD-10-CM | POA: Diagnosis not present

## 2023-01-13 MED ORDER — AMOXICILLIN-POT CLAVULANATE 875-125 MG PO TABS
1.0000 | ORAL_TABLET | Freq: Two times a day (BID) | ORAL | 0 refills | Status: DC
Start: 2023-01-13 — End: 2023-03-11
  Filled 2023-01-13: qty 14, 7d supply, fill #0

## 2023-01-13 NOTE — Progress Notes (Signed)
I have spent 5 minutes in review of e-visit questionnaire, review and updating patient chart, medical decision making and response to patient.   Mia Milan Cody Jacklynn Dehaas, PA-C    

## 2023-01-13 NOTE — Progress Notes (Signed)

## 2023-01-14 ENCOUNTER — Other Ambulatory Visit (HOSPITAL_COMMUNITY): Payer: Self-pay

## 2023-01-14 DIAGNOSIS — F4312 Post-traumatic stress disorder, chronic: Secondary | ICD-10-CM | POA: Diagnosis not present

## 2023-01-14 DIAGNOSIS — F900 Attention-deficit hyperactivity disorder, predominantly inattentive type: Secondary | ICD-10-CM | POA: Diagnosis not present

## 2023-01-14 DIAGNOSIS — F411 Generalized anxiety disorder: Secondary | ICD-10-CM | POA: Diagnosis not present

## 2023-01-15 ENCOUNTER — Other Ambulatory Visit (HOSPITAL_COMMUNITY): Payer: Self-pay

## 2023-01-15 MED ORDER — NORGESTIMATE-ETH ESTRADIOL 0.25-35 MG-MCG PO TABS
1.0000 | ORAL_TABLET | Freq: Every day | ORAL | 3 refills | Status: DC
  Filled 2023-01-15: qty 84, 84d supply, fill #0

## 2023-01-15 MED ORDER — MEDROXYPROGESTERONE ACETATE 10 MG PO TABS
10.0000 mg | ORAL_TABLET | Freq: Every day | ORAL | 0 refills | Status: DC
  Filled 2023-01-15: qty 7, 7d supply, fill #0

## 2023-01-19 DIAGNOSIS — F411 Generalized anxiety disorder: Secondary | ICD-10-CM | POA: Diagnosis not present

## 2023-01-19 DIAGNOSIS — F4312 Post-traumatic stress disorder, chronic: Secondary | ICD-10-CM | POA: Diagnosis not present

## 2023-01-19 DIAGNOSIS — F900 Attention-deficit hyperactivity disorder, predominantly inattentive type: Secondary | ICD-10-CM | POA: Diagnosis not present

## 2023-01-21 ENCOUNTER — Other Ambulatory Visit (HOSPITAL_COMMUNITY): Payer: Self-pay

## 2023-02-02 DIAGNOSIS — F900 Attention-deficit hyperactivity disorder, predominantly inattentive type: Secondary | ICD-10-CM | POA: Diagnosis not present

## 2023-02-02 DIAGNOSIS — F411 Generalized anxiety disorder: Secondary | ICD-10-CM | POA: Diagnosis not present

## 2023-02-02 DIAGNOSIS — F4312 Post-traumatic stress disorder, chronic: Secondary | ICD-10-CM | POA: Diagnosis not present

## 2023-02-04 DIAGNOSIS — F4312 Post-traumatic stress disorder, chronic: Secondary | ICD-10-CM | POA: Diagnosis not present

## 2023-02-04 DIAGNOSIS — F900 Attention-deficit hyperactivity disorder, predominantly inattentive type: Secondary | ICD-10-CM | POA: Diagnosis not present

## 2023-02-04 DIAGNOSIS — F411 Generalized anxiety disorder: Secondary | ICD-10-CM | POA: Diagnosis not present

## 2023-02-12 ENCOUNTER — Encounter: Payer: Self-pay | Admitting: Nurse Practitioner

## 2023-02-16 DIAGNOSIS — F4312 Post-traumatic stress disorder, chronic: Secondary | ICD-10-CM | POA: Diagnosis not present

## 2023-02-16 DIAGNOSIS — F900 Attention-deficit hyperactivity disorder, predominantly inattentive type: Secondary | ICD-10-CM | POA: Diagnosis not present

## 2023-02-16 DIAGNOSIS — F411 Generalized anxiety disorder: Secondary | ICD-10-CM | POA: Diagnosis not present

## 2023-02-17 ENCOUNTER — Other Ambulatory Visit (HOSPITAL_COMMUNITY): Payer: Self-pay

## 2023-02-17 ENCOUNTER — Other Ambulatory Visit: Payer: Self-pay

## 2023-02-17 MED ORDER — FREESTYLE LIBRE 3 SENSOR MISC
5 refills | Status: DC
  Filled 2023-02-19: qty 2, 28d supply, fill #0
  Filled 2023-03-13 – 2023-03-19 (×3): qty 2, 28d supply, fill #1
  Filled 2023-04-05 – 2023-04-11 (×3): qty 2, 28d supply, fill #2
  Filled 2023-05-15: qty 2, 28d supply, fill #3
  Filled 2023-06-10: qty 2, 28d supply, fill #4
  Filled 2023-07-07: qty 2, 28d supply, fill #5

## 2023-02-19 ENCOUNTER — Other Ambulatory Visit: Payer: Self-pay

## 2023-02-21 ENCOUNTER — Other Ambulatory Visit (HOSPITAL_COMMUNITY): Payer: Self-pay

## 2023-02-23 ENCOUNTER — Other Ambulatory Visit (HOSPITAL_COMMUNITY): Payer: Self-pay

## 2023-02-23 MED ORDER — FREESTYLE LIBRE 3 PLUS SENSOR MISC
5 refills | Status: AC
  Filled 2023-02-23 – 2023-07-30 (×2): qty 2, 30d supply, fill #0
  Filled 2023-08-25: qty 2, 30d supply, fill #1
  Filled 2023-09-24: qty 2, 30d supply, fill #2
  Filled 2023-10-28: qty 2, 30d supply, fill #3
  Filled 2023-11-29: qty 2, 30d supply, fill #4
  Filled 2023-12-23 – 2023-12-24 (×2): qty 2, 30d supply, fill #5

## 2023-02-24 ENCOUNTER — Other Ambulatory Visit (HOSPITAL_COMMUNITY): Payer: Self-pay

## 2023-02-24 DIAGNOSIS — R197 Diarrhea, unspecified: Secondary | ICD-10-CM | POA: Diagnosis not present

## 2023-02-25 ENCOUNTER — Other Ambulatory Visit (HOSPITAL_COMMUNITY): Payer: Self-pay

## 2023-02-25 DIAGNOSIS — R197 Diarrhea, unspecified: Secondary | ICD-10-CM | POA: Diagnosis not present

## 2023-03-03 ENCOUNTER — Other Ambulatory Visit (HOSPITAL_COMMUNITY): Payer: Self-pay

## 2023-03-03 MED ORDER — SLYND 4 MG PO TABS
4.0000 mg | ORAL_TABLET | Freq: Every day | ORAL | 2 refills | Status: DC
  Filled 2023-03-03: qty 84, 84d supply, fill #0

## 2023-03-05 DIAGNOSIS — F900 Attention-deficit hyperactivity disorder, predominantly inattentive type: Secondary | ICD-10-CM | POA: Diagnosis not present

## 2023-03-05 DIAGNOSIS — F411 Generalized anxiety disorder: Secondary | ICD-10-CM | POA: Diagnosis not present

## 2023-03-05 DIAGNOSIS — F4312 Post-traumatic stress disorder, chronic: Secondary | ICD-10-CM | POA: Diagnosis not present

## 2023-03-11 ENCOUNTER — Ambulatory Visit: Payer: Commercial Managed Care - PPO | Admitting: Family Medicine

## 2023-03-11 ENCOUNTER — Other Ambulatory Visit (HOSPITAL_COMMUNITY): Payer: Self-pay

## 2023-03-11 ENCOUNTER — Encounter: Payer: Self-pay | Admitting: Family Medicine

## 2023-03-11 VITALS — BP 108/70 | HR 80 | Temp 98.0°F | Resp 16 | Ht 62.0 in | Wt 186.2 lb

## 2023-03-11 DIAGNOSIS — R197 Diarrhea, unspecified: Secondary | ICD-10-CM | POA: Insufficient documentation

## 2023-03-11 DIAGNOSIS — G43109 Migraine with aura, not intractable, without status migrainosus: Secondary | ICD-10-CM | POA: Diagnosis not present

## 2023-03-11 DIAGNOSIS — E118 Type 2 diabetes mellitus with unspecified complications: Secondary | ICD-10-CM | POA: Diagnosis not present

## 2023-03-11 DIAGNOSIS — J069 Acute upper respiratory infection, unspecified: Secondary | ICD-10-CM | POA: Diagnosis not present

## 2023-03-11 MED ORDER — NURTEC 75 MG PO TBDP
75.0000 mg | ORAL_TABLET | ORAL | 2 refills | Status: AC | PRN
Start: 2023-03-11 — End: ?
  Filled 2023-03-11: qty 30, 30d supply, fill #0
  Filled 2024-01-08: qty 8, 30d supply, fill #0
  Filled 2024-01-11: qty 30, 30d supply, fill #0

## 2023-03-11 NOTE — Assessment & Plan Note (Signed)
History of migraines with aura. -Start Nurtec as needed for migraines.

## 2023-03-11 NOTE — Assessment & Plan Note (Addendum)
Controlled with diet, exercise, and Phentermine. History of necrotizing pancreatitis and gallbladder removal. -Continue current management plan. -Continue Libre glucose monitoring -Follow up with endocrinologist as scheduled. (03/12/23)

## 2023-03-11 NOTE — Patient Instructions (Signed)
 Aim to do some physical activity for 150 minutes per week. This is typically divided into 5 days per week, 30 minutes per day. The activity should be enough to get your heart rate up. Anything is better than nothing if you have time constraints.

## 2023-03-11 NOTE — Assessment & Plan Note (Signed)
Not at goal -continue current weight management program of carb modified diet and exercise -phentermine 37.5 mg by mouth once daily in the morning

## 2023-03-11 NOTE — Progress Notes (Signed)
Subjective:  Patient ID: [name] Blanchard, female    DOB: 1990/11/01  Age: 32 y.o. MRN: [id number]  Chief Complaint  Patient presents with   Establish Care   Sinusitis    Patient is establishing as a new patient.  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 Symptoms include a sore throat, cough, and congestion. The patient has been self-medicating with over-the-counter medications. The patient denies fever, chills, night sweats, loss of appetite, muscle aches, and shortness of breath. The patient also reports chronic diarrhea, which has been ongoing for about six months. The patient has seen a GI specialist and has had stool studies, which were normal except for a small amount of fat in the stool. The patient is scheduled for a colonoscopy. The patient also suffers from migraines, which are often preceded by an aura. The patient has been managing her diabetes with diet, exercise, and phentermine, which has helped her lose 30 pounds. The patient also has a history of gallstones and has had a stent placed in her pancreas.     03/11/2023    9:48 AM 07/03/2021    1:35 PM  Depression screen PHQ 2/9  Decreased Interest 0 0  Down, Depressed, Hopeless 0 0  PHQ - 2 Score 0 0         09/19/2021    9:20 AM 09/19/2021    8:20 PM 09/20/2021    8:15 AM 09/20/2021    8:15 PM 09/21/2021    9:00 AM  Fall Risk  (RETIRED) Patient Fall Risk Level Moderate fall risk Low fall risk Low fall risk Low fall risk Low fall risk     Current Outpatient Medications on File Prior to Visit  Medication Sig Dispense Refill   Continuous Glucose Sensor (FREESTYLE LIBRE 3 PLUS SENSOR) MISC Use 1 sensor every 15 days. 2 each 5   Continuous Glucose Sensor (FREESTYLE LIBRE 3 SENSOR) MISC change sensor ever 2 weeks 2 each 5   Drospirenone (SLYND) 4 MG TABS Take 1 tablet (4 mg  total) by mouth daily. 84 tablet 2   Multiple Vitamin (MULTIVITAMIN) capsule Take 1 capsule by mouth daily.     Phentermine HCl (LOMAIRA) 8 MG TABS Take 1 tablet (8 mg) by mouth daily 30 minutes before breakfast. 30 tablet 4   acetaminophen (TYLENOL) 325 MG tablet Take 2 tablets (650 mg total) by mouth every 6 (six) hours as needed. (Patient not taking: Reported on 03/11/2023) 30 tablet 0   amoxicillin-clavulanate (AUGMENTIN) 875-125 MG tablet Take 1 tablet by mouth 2 (two) times daily. (Patient not taking: Reported on 03/11/2023) 14 tablet 0   Insulin NPH, Human,, Isophane, (HUMULIN N KWIKPEN) 100 UNIT/ML Kiwkpen Inject 10 Units into the skin every evening.  Discard pen 28 days after initial use. (Patient not taking: Reported on 03/11/2023) 9 mL 5   meclizine (ANTIVERT) 25 MG tablet Take 1 tablet (25 mg total) by mouth 3 (three) times daily as needed for dizziness. (Patient not taking: Reported on 03/11/2023) 30 tablet 0   medroxyPROGESTERone (PROVERA) 10 MG tablet Take 1 tablet (10 mg total) by mouth daily for 7 days. 7 tablet 0   metFORMIN (GLUCOPHAGE) 500 MG tablet Take 1 tablet (500 mg total) by mouth 2 (two) times daily with a meal. (Patient not taking: Reported on 03/11/2023) 60 tablet 3   Prenatal Vit-Fe Fumarate-FA (PRENATAL MULTIVITAMIN) TABS tablet Take  1 tablet by mouth daily at 12 noon. (Patient not taking: Reported on 03/11/2023)     scopolamine (TRANSDERM-SCOP) 1 MG/3DAYS Place 1 patch (1.5 mg total) onto the skin every 3 (three) days. (Patient not taking: Reported on 03/11/2023) 10 patch 12   [DISCONTINUED] norgestimate-ethinyl estradiol (SPRINTEC 28) 0.25-35 MG-MCG tablet Take 1 tablet by mouth daily. 84 tablet 3   No current facility-administered medications on file prior to visit.  . Social History   Socioeconomic History   Marital status: Single    Spouse name: Not on file   Number of children: Not on file   Years of education: Not on file   Highest education level: Not on  file  Occupational History   Occupation: student  Tobacco Use   Smoking status: Never   Smokeless tobacco: Never  Substance and Sexual Activity   Alcohol use: Not Currently    Comment: Occasionally few mixed drinks with friends   Drug use: No   Sexual activity: Yes  Other Topics Concern   Not on file  Social History Narrative   Emergency department RN   Social Determinants of Health   Financial Resource Strain: Not on file  Food Insecurity: No Food Insecurity (07/03/2021)   Hunger Vital Sign    Worried About Running Out of Food in the Last Year: Never true    Ran Out of Food in the Last Year: Never true  Transportation Needs: Not on file  Physical Activity: Not on file  Stress: Not on file  Social Connections: Unknown (02/12/2023)   Received from [facility]   Social Network    Social Network: Not on file   Past Medical History:  Diagnosis Date   Anxiety    diagnosed in 2012   Dysmenorrhea    Gestational diabetes    Pancreatitis    necrotizing pancreatitis w/ sepsis   Family History  Problem Relation Age of Onset   Hypertension Father    Diabetes Father    Hypertension Maternal Grandmother    Diabetes Maternal Grandmother    Hypertension Maternal Grandfather    Diabetes Maternal Grandfather    Hypertension Paternal Grandmother    Diabetes Paternal Grandmother    Hypertension Paternal Grandfather    Diabetes Paternal Grandfather     Review of Systems  Constitutional:  Negative for chills, diaphoresis, fatigue and fever.  HENT:  Positive for congestion, ear pain, rhinorrhea, sinus pain, sneezing and sore throat. Negative for sinus pressure.   Respiratory:  Positive for cough. Negative for shortness of breath.   Cardiovascular:  Negative for chest pain.  Gastrointestinal:  Positive for diarrhea (colonoscopy to be scheduled). Negative for abdominal pain, blood in stool, constipation, nausea and vomiting.  Genitourinary:  Negative for dysuria, frequency and  urgency.  Musculoskeletal:  Negative for arthralgias, back pain and joint swelling.  Skin:  Negative for rash.  Neurological:  Positive for headaches.  Psychiatric/Behavioral:  Negative for dysphoric mood and sleep disturbance. The patient is not nervous/anxious.      Objective:  BP 108/70   Pulse 80   Temp 98 F (36.7 C)   Resp 16   Ht 5\' 2"  (1.575 m)   Wt 186 lb 3.2 oz (84.5 kg)   LMP 01/21/2023   SpO2 100%   BMI 34.06 kg/m      03/11/2023    9:37 AM 07/28/2022   11:53 PM 07/28/2022    9:22 PM  BP/Weight  Systolic BP 108 122 125  Diastolic BP 70 75 84  Wt. (Lbs) 186.2    BMI 34.06 kg/m2      Physical Exam Vitals reviewed.  Constitutional:      General: She is not in acute distress.    Appearance: Normal appearance. She is not ill-appearing.  HENT:     Head: Normocephalic.     Salivary Glands: Right salivary gland is tender.     Right Ear: Hearing and tympanic membrane normal. Tenderness present.     Left Ear: Hearing and tympanic membrane normal.     Nose: Nose normal.     Mouth/Throat:     Pharynx: Posterior oropharyngeal erythema present.  Eyes:     Conjunctiva/sclera: Conjunctivae normal.  Neck:     Vascular: No carotid bruit.  Cardiovascular:     Rate and Rhythm: Normal rate and regular rhythm.     Heart sounds: Normal heart sounds.  Pulmonary:     Effort: Pulmonary effort is normal.     Breath sounds: Normal breath sounds. No wheezing.  Abdominal:     General: Bowel sounds are normal.     Palpations: Abdomen is soft.     Tenderness: There is no abdominal tenderness.  Skin:    General: Skin is warm.  Neurological:     Mental Status: She is alert. Mental status is at baseline.  Psychiatric:        Mood and Affect: Mood normal.        Behavior: Behavior normal.       Lab Results  Component Value Date   WBC 13.2 (H) 07/28/2022   HGB 14.1 07/28/2022   HCT 43.5 07/28/2022   PLT 357 07/28/2022   GLUCOSE 117 (H) 07/28/2022   CHOL 102 07/12/2013    TRIG 60 08/15/2014   HDL 58 07/12/2013   LDLCALC 38 07/12/2013   ALT 29 07/28/2022   AST 20 07/28/2022   NA 138 07/28/2022   K 3.8 07/28/2022   CL 102 07/28/2022   CREATININE 0.93 07/28/2022   BUN 10 07/28/2022   CO2 26 07/28/2022   TSH 2.87 07/12/2013   INR 0.99 08/06/2014   HGBA1C 5.5 02/18/2016      Assessment & Plan:  Nakhia was seen today for establish care and sinusitis.  Viral upper respiratory tract infection Assessment & Plan: Symptoms of sore throat, cough, and congestion for 7 days. No fever, chills, night sweats, or shortness of breath. Possible exposure to sick individuals at work. -Continue self-care with over-the-counter medications. -Contact provider if symptoms persist beyond 10 days for possible antibiotic treatment.   Controlled type 2 diabetes mellitus with complication, without long-term current use of insulin (HCC) Assessment & Plan: Controlled with diet, exercise, and Phentermine. History of necrotizing pancreatitis and gallbladder removal. -Continue current management plan. -Continue Libre glucose monitoring -Follow up with endocrinologist as scheduled. 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 -Start Nurtec as needed for migraines.  Orders: -     Nurtec; Take 1 tablet (75 mg total) by mouth as needed (migraine).  Dispense: 30 tablet; Refill: 2  Morbid obesity (HCC) Assessment & Plan: Not at goal -continue current weight management program of carb modified diet and exercise -phentermine 37.5 mg by mouth once daily in the morning   Diarrhea,  unspecified type Assessment & Plan: Chronic - Patient has an appointment for coloscopy - GI has completed stool studies - they were normal     Meds ordered this encounter  Medications    Rimegepant Sulfate (NURTEC) 75 MG TBDP    Sig: Take 1 tablet (75 mg total) by mouth as needed (migraine).    Dispense:  30 tablet    Refill:  2   Orders Placed This Encounter  Procedures   Comprehensive metabolic panel   CBC with Differential   Lipid Panel   TSH   T4, Free      Follow-up: Return in about 1 month (around 04/10/2023) for Annual Physical.  AVS was given to patient prior to departure.  Total time spent on today's visit was greater than 45 minutes, including both face-to-face time and nonface-to-face time personally spent on review of chart (labs and imaging), discussing labs and goals, discussing further work-up, treatment options, referrals to specialist if needed, reviewing outside records if pertinent, answering patient's questions, and coordinating care.    Lajuana Matte, FNP [facility] [phone]

## 2023-03-11 NOTE — Assessment & Plan Note (Addendum)
Chronic - Patient has an appointment for coloscopy - GI has completed stool studies - they were normal

## 2023-03-11 NOTE — Assessment & Plan Note (Signed)
Symptoms of sore throat, cough, and congestion for 7 days. No fever, chills, night sweats, or shortness of breath. Possible exposure to sick individuals at work. -Continue self-care with over-the-counter medications. -Contact provider if symptoms persist beyond 10 days for possible antibiotic treatment.

## 2023-03-12 ENCOUNTER — Other Ambulatory Visit: Payer: Self-pay | Admitting: Family Medicine

## 2023-03-12 ENCOUNTER — Telehealth: Payer: Self-pay

## 2023-03-12 DIAGNOSIS — R739 Hyperglycemia, unspecified: Secondary | ICD-10-CM

## 2023-03-12 DIAGNOSIS — E039 Hypothyroidism, unspecified: Secondary | ICD-10-CM

## 2023-03-12 LAB — COMPREHENSIVE METABOLIC PANEL
ALT: 20 [IU]/L (ref 0–32)
AST: 20 [IU]/L (ref 0–40)
Albumin: 4.6 g/dL (ref 3.9–4.9)
Alkaline Phosphatase: 55 [IU]/L (ref 44–121)
BUN/Creatinine Ratio: 13 (ref 9–23)
BUN: 11 mg/dL (ref 6–20)
Bilirubin Total: 0.6 mg/dL (ref 0.0–1.2)
CO2: 21 mmol/L (ref 20–29)
Calcium: 9.6 mg/dL (ref 8.7–10.2)
Chloride: 100 mmol/L (ref 96–106)
Creatinine, Ser: 0.87 mg/dL (ref 0.57–1.00)
Globulin, Total: 2.9 g/dL (ref 1.5–4.5)
Glucose: 115 mg/dL — ABNORMAL HIGH (ref 70–99)
Potassium: 4.5 mmol/L (ref 3.5–5.2)
Sodium: 139 mmol/L (ref 134–144)
Total Protein: 7.5 g/dL (ref 6.0–8.5)
eGFR: 91 mL/min/{1.73_m2} (ref >59)

## 2023-03-12 LAB — CBC WITH DIFFERENTIAL/PLATELET
Basophils Absolute: 0.1 10*3/uL (ref 0.0–0.2)
Basos: 1 %
EOS (ABSOLUTE): 0.2 10*3/uL (ref 0.0–0.4)
Eos: 2 %
Hematocrit: 41.6 % (ref 34.0–46.6)
Hemoglobin: 13.4 g/dL (ref 11.1–15.9)
Immature Grans (Abs): 0 10*3/uL (ref 0.0–0.1)
Immature Granulocytes: 0 %
Lymphocytes Absolute: 2.4 10*3/uL (ref 0.7–3.1)
Lymphs: 27 %
MCH: 28.3 pg (ref 26.6–33.0)
MCHC: 32.2 g/dL (ref 31.5–35.7)
MCV: 88 fL (ref 79–97)
Monocytes Absolute: 0.5 10*3/uL (ref 0.1–0.9)
Monocytes: 5 %
Neutrophils Absolute: 5.9 10*3/uL (ref 1.4–7.0)
Neutrophils: 65 %
Platelets: 339 10*3/uL (ref 150–450)
RBC: 4.74 x10E6/uL (ref 3.77–5.28)
RDW: 13.2 % (ref 11.7–15.4)
WBC: 9.1 10*3/uL (ref 3.4–10.8)

## 2023-03-12 LAB — T4, FREE: Free T4: 1.38 ng/dL (ref 0.82–1.77)

## 2023-03-12 LAB — LIPID PANEL
Chol/HDL Ratio: 2 ratio (ref 0.0–4.4)
Cholesterol, Total: 115 mg/dL (ref 100–199)
HDL: 58 mg/dL (ref >39)
LDL Chol Calc (NIH): 46 mg/dL (ref 0–99)
Triglycerides: 42 mg/dL (ref 0–149)
VLDL Cholesterol Cal: 11 mg/dL (ref 5–40)

## 2023-03-12 LAB — TSH: TSH: 4.98 u[IU]/mL — ABNORMAL HIGH (ref 0.450–4.500)

## 2023-03-12 MED ORDER — LEVOTHYROXINE SODIUM 50 MCG PO TABS
50.0000 ug | ORAL_TABLET | Freq: Every day | ORAL | 11 refills | Status: DC
Start: 2023-03-12 — End: 2023-04-15

## 2023-03-12 NOTE — Telephone Encounter (Signed)
PA submitted for Nurtec via Cover My Meds - awaiting outcome.

## 2023-03-13 ENCOUNTER — Other Ambulatory Visit (HOSPITAL_COMMUNITY): Payer: Self-pay

## 2023-03-13 DIAGNOSIS — Z794 Long term (current) use of insulin: Secondary | ICD-10-CM | POA: Diagnosis not present

## 2023-03-13 DIAGNOSIS — E1165 Type 2 diabetes mellitus with hyperglycemia: Secondary | ICD-10-CM | POA: Diagnosis not present

## 2023-03-13 DIAGNOSIS — F900 Attention-deficit hyperactivity disorder, predominantly inattentive type: Secondary | ICD-10-CM | POA: Diagnosis not present

## 2023-03-13 DIAGNOSIS — F411 Generalized anxiety disorder: Secondary | ICD-10-CM | POA: Diagnosis not present

## 2023-03-13 DIAGNOSIS — N926 Irregular menstruation, unspecified: Secondary | ICD-10-CM | POA: Diagnosis not present

## 2023-03-13 DIAGNOSIS — K859 Acute pancreatitis without necrosis or infection, unspecified: Secondary | ICD-10-CM | POA: Diagnosis not present

## 2023-03-13 DIAGNOSIS — F4312 Post-traumatic stress disorder, chronic: Secondary | ICD-10-CM | POA: Diagnosis not present

## 2023-03-13 MED ORDER — LEVOTHYROXINE SODIUM 50 MCG PO TABS
50.0000 ug | ORAL_TABLET | Freq: Every day | ORAL | 11 refills | Status: DC
  Filled 2023-03-13: qty 30, 30d supply, fill #0
  Filled 2023-04-05: qty 30, 30d supply, fill #1

## 2023-03-13 MED ORDER — CEPHALEXIN 500 MG PO CAPS
500.0000 mg | ORAL_CAPSULE | Freq: Three times a day (TID) | ORAL | 1 refills | Status: DC
  Filled 2023-03-13: qty 21, 7d supply, fill #0

## 2023-03-14 ENCOUNTER — Other Ambulatory Visit (HOSPITAL_COMMUNITY): Payer: Self-pay

## 2023-03-16 ENCOUNTER — Other Ambulatory Visit: Payer: Self-pay

## 2023-03-17 ENCOUNTER — Other Ambulatory Visit (HOSPITAL_COMMUNITY): Payer: Self-pay

## 2023-03-18 DIAGNOSIS — N926 Irregular menstruation, unspecified: Secondary | ICD-10-CM | POA: Diagnosis not present

## 2023-03-18 DIAGNOSIS — Z309 Encounter for contraceptive management, unspecified: Secondary | ICD-10-CM | POA: Diagnosis not present

## 2023-03-19 ENCOUNTER — Other Ambulatory Visit (HOSPITAL_COMMUNITY): Payer: Self-pay

## 2023-03-19 ENCOUNTER — Other Ambulatory Visit: Payer: Self-pay

## 2023-03-20 ENCOUNTER — Other Ambulatory Visit (HOSPITAL_COMMUNITY): Payer: Self-pay

## 2023-03-30 DIAGNOSIS — F4312 Post-traumatic stress disorder, chronic: Secondary | ICD-10-CM | POA: Diagnosis not present

## 2023-03-30 DIAGNOSIS — F411 Generalized anxiety disorder: Secondary | ICD-10-CM | POA: Diagnosis not present

## 2023-03-30 DIAGNOSIS — F900 Attention-deficit hyperactivity disorder, predominantly inattentive type: Secondary | ICD-10-CM | POA: Diagnosis not present

## 2023-04-06 ENCOUNTER — Other Ambulatory Visit: Payer: Self-pay

## 2023-04-06 ENCOUNTER — Other Ambulatory Visit (HOSPITAL_COMMUNITY): Payer: Self-pay

## 2023-04-06 DIAGNOSIS — F4312 Post-traumatic stress disorder, chronic: Secondary | ICD-10-CM | POA: Diagnosis not present

## 2023-04-06 DIAGNOSIS — F900 Attention-deficit hyperactivity disorder, predominantly inattentive type: Secondary | ICD-10-CM | POA: Diagnosis not present

## 2023-04-06 DIAGNOSIS — F411 Generalized anxiety disorder: Secondary | ICD-10-CM | POA: Diagnosis not present

## 2023-04-10 ENCOUNTER — Encounter: Payer: Self-pay | Admitting: Family Medicine

## 2023-04-10 ENCOUNTER — Other Ambulatory Visit: Payer: Self-pay

## 2023-04-10 ENCOUNTER — Ambulatory Visit (INDEPENDENT_AMBULATORY_CARE_PROVIDER_SITE_OTHER): Payer: Commercial Managed Care - PPO | Admitting: Family Medicine

## 2023-04-10 ENCOUNTER — Other Ambulatory Visit (HOSPITAL_COMMUNITY): Payer: Self-pay

## 2023-04-10 VITALS — BP 98/68 | HR 82 | Temp 97.0°F | Ht 62.0 in | Wt 190.0 lb

## 2023-04-10 DIAGNOSIS — Z0000 Encounter for general adult medical examination without abnormal findings: Secondary | ICD-10-CM | POA: Insufficient documentation

## 2023-04-10 NOTE — Progress Notes (Signed)
Subjective:  Patient ID: [name] Blanchard, female    DOB: [birth year]  Age: 32 y.o. MRN: [id number]  Chief Complaint  Patient presents with   Annual Exam    HPI  Well Adult Physical: Patient here for a comprehensive physical exam.The patient reports no problems Do you take any herbs or supplements that were not prescribed by a doctor? no Are you taking calcium supplements? no Are you taking aspirin daily? No  The patient, with a history of hypothyroidism, migraines, and irregular menses, presents for a follow-up visit. She reports that her endocrinologist recently ran labs and will be repeating them next week to assess if a Synthroid dose adjustment is needed. The patient has not had any recent headaches and has not needed to use the prescribed Nurtec. She recently stopped taking birth control and has not yet had a normal menstrual cycle since discontinuing it. She continues to take phentermine and has not experienced any side effects such as dry mouth. The patient also reports intermittent diarrhea, which she describes as "comes and goes." She has a colonoscopy scheduled for January 22nd due to these ongoing gastrointestinal symptoms.    Encounter for general adult medical examination without abnormal findings  Physical ("At Risk" items are starred): Patient's last physical exam was 1 year ago .  Patient is not afflicted from Stress Incontinence and Urge Incontinence  Patient wears a seat belts Patient has smoke detectors and has carbon monoxide detectors. Patient practices appropriate gun safety. Patient wears sunscreen with extended sun exposure. Dental Care: biannual cleanings, brushes and flosses daily. Ophthalmology/Optometry: Annual visit.  Hearing loss: none Vision impairments: none  Menarche: 12 Menstrual History: regular, IUD x 2 (8 years) - irregular now LMP: 03/14/23 - 03/25/23 Pregnancy history: 22 month old boy  Safe at home: Yes Self breast exams: Yes      04/10/2023    9:47 AM 03/11/2023    9:48 AM 07/03/2021    1:35 PM  Depression screen PHQ 2/9  Decreased Interest 0 0 0  Down, Depressed, Hopeless 0 0 0  PHQ - 2 Score 0 0 0         09/19/2021    8:20 PM 09/20/2021    8:15 AM 09/20/2021    8:15 PM 09/21/2021    9:00 AM 04/10/2023    9:48 AM  Fall Risk  Falls in the past year?     0  Was there an injury with Fall?     0  Fall Risk Category Calculator     0  (RETIRED) Patient Fall Risk Level Low fall risk Low fall risk Low fall risk Low fall risk   Patient at Risk for Falls Due to     No Fall Risks  Fall risk Follow up     Falls evaluation completed             Social Hx   Social History   Socioeconomic History   Marital status: Single    Spouse name: Not on file   Number of children: Not on file   Years of education: Not on file   Highest education level: Not on file  Occupational History   Occupation: student  Tobacco Use   Smoking status: Never   Smokeless tobacco: Never  Substance and Sexual Activity   Alcohol use: Not Currently    Comment: Occasionally few mixed drinks with friends   Drug use: No   Sexual activity: Yes  Other Topics Concern   Not on  file  Social History Narrative   Emergency department RN   Social Drivers of Health   Financial Resource Strain: Low Risk  (04/10/2023)   Overall Financial Resource Strain (CARDIA)    Difficulty of Paying Living Expenses: Not hard at all  Food Insecurity: No Food Insecurity (04/10/2023)   Hunger Vital Sign    Worried About Running Out of Food in the Last Year: Never true    Ran Out of Food in the Last Year: Never true  Transportation Needs: No Transportation Needs (04/10/2023)   PRAPARE - Administrator, Civil Service (Medical): No    Lack of Transportation (Non-Medical): No  Physical Activity: Not on file  Stress: No Stress Concern Present (04/10/2023)   [employer] of Occupational Health - Occupational Stress Questionnaire    Feeling of  Stress : Not at all  Social Connections: Unknown (02/12/2023)   Received from [facility]   Social Network    Social Network: Not on file   Past Medical History:  Diagnosis Date   Anxiety    diagnosed in 2012   Dysmenorrhea    Gestational diabetes    Pancreatitis    necrotizing pancreatitis w/ sepsis   Past Surgical History:  Procedure Laterality Date   ADENOIDECTOMY     32y/o   CESAREAN SECTION N/A 09/18/2021   Procedure: CESAREAN SECTION;  Surgeon: Marcelle Overlie, MD;  Location: [facility] LD [facility]S;  Service: Obstetrics;  Laterality: N/A;   CHOLECYSTECTOMY     KNEE SURGERY Right 09/10/2010   Dr. Luiz Blare   TONSILLECTOMY     32 y/o   WISDOM TOOTH EXTRACTION     unsure if 2 or 4 teeth    Family History  Problem Relation Age of Onset   Hypertension Father    Diabetes Father    Hypertension Maternal Grandmother    Diabetes Maternal Grandmother    Hypertension Maternal Grandfather    Diabetes Maternal Grandfather    Hypertension Paternal Grandmother    Diabetes Paternal Grandmother    Hypertension Paternal Grandfather    Diabetes Paternal Grandfather     Review of Systems  Constitutional:  Negative for appetite change, fatigue and fever.  HENT:  Negative for congestion, ear pain, sinus pressure and sore throat.   Respiratory:  Negative for cough, chest tightness, shortness of breath and wheezing.   Cardiovascular:  Negative for chest pain and palpitations.  Gastrointestinal:  Positive for diarrhea (comes and goes). Negative for abdominal pain, constipation, nausea and vomiting.  Genitourinary:  Negative for dysuria and hematuria.  Musculoskeletal:  Negative for arthralgias, back pain, joint swelling and myalgias.  Skin:  Negative for rash.       Dry and cracked hands  Neurological:  Negative for dizziness, weakness and headaches.  Psychiatric/Behavioral:  Negative for dysphoric mood. The patient is not nervous/anxious.      Objective:  BP 98/68 (BP Location: Left Arm,  Patient Position: Sitting)   Pulse 82   Temp (!) 97 F (36.1 C) (Temporal)   Ht 5\' 2"  (1.575 m)   Wt 190 lb (86.2 kg)   LMP 01/21/2023   SpO2 100%   BMI 34.75 kg/m      04/10/2023    9:44 AM 03/11/2023    9:37 AM 07/28/2022   11:53 PM  BP/Weight  Systolic BP 98 108 122  Diastolic BP 68 70 75  Wt. (Lbs) 190 186.2   BMI 34.75 kg/m2 34.06 kg/m2     Physical Exam Vitals reviewed.  Constitutional:  General: She is not in acute distress.    Appearance: Normal appearance. She is well-groomed. She is obese. She is not ill-appearing.  HENT:     Head: Normocephalic.     Right Ear: Tympanic membrane, ear canal and external ear normal.     Left Ear: Tympanic membrane, ear canal and external ear normal.     Nose: Nose normal.     Mouth/Throat:     Mouth: Mucous membranes are moist.     Pharynx: Oropharynx is clear. No posterior oropharyngeal erythema.  Eyes:     Extraocular Movements: Extraocular movements intact.     Conjunctiva/sclera: Conjunctivae normal.     Pupils: Pupils are equal, round, and reactive to light.  Neck:     Vascular: No carotid bruit.  Cardiovascular:     Rate and Rhythm: Normal rate and regular rhythm.     Pulses: Normal pulses.     Heart sounds: Normal heart sounds.  Pulmonary:     Effort: Pulmonary effort is normal.     Breath sounds: Normal breath sounds. No wheezing.  Abdominal:     General: Bowel sounds are normal.     Palpations: Abdomen is soft.     Tenderness: There is no abdominal tenderness.  Musculoskeletal:        General: Normal range of motion.     Cervical back: Normal range of motion and neck supple.  Skin:    General: Skin is warm and dry.  Neurological:     Mental Status: She is alert and oriented to person, place, and time. Mental status is at baseline.     Cranial Nerves: Cranial nerves 2-12 are intact.     Sensory: Sensation is intact.     Motor: Motor function is intact.     Coordination: Coordination is intact.     Deep  Tendon Reflexes: Reflexes are normal and symmetric.  Psychiatric:        Mood and Affect: Mood normal.        Behavior: Behavior normal.        Thought Content: Thought content normal.        Judgment: Judgment normal.     Lab Results  Component Value Date   WBC 9.1 03/11/2023   HGB 13.4 03/11/2023   HCT 41.6 03/11/2023   PLT 339 03/11/2023   GLUCOSE 115 (H) 03/11/2023   CHOL 115 03/11/2023   TRIG 42 03/11/2023   HDL 58 03/11/2023   LDLCALC 46 03/11/2023   ALT 20 03/11/2023   AST 20 03/11/2023   NA 139 03/11/2023   K 4.5 03/11/2023   CL 100 03/11/2023   CREATININE 0.87 03/11/2023   BUN 11 03/11/2023   CO2 21 03/11/2023   TSH 4.980 (H) 03/11/2023   INR 0.99 08/06/2014   HGBA1C 5.5 02/18/2016      Assessment & Plan:  There are no diagnoses linked to this encounter.   Body mass index is 34.75 kg/m.   These are the goals we discussed:  Goals      CCM Expected Outcome:  Monitor, Self-Manage and Reduce Symptoms of Diabetes     Managed by Endocrinologist. Continue weight loss medication  Increase activity Consider nutrition/dietician for food planning         This is a list of the screening recommended for you and due dates:  Health Maintenance  Topic Date Due   Complete foot exam   Never done   Eye exam for diabetics  Never done  Yearly kidney health urinalysis for diabetes  Never done   Hemoglobin A1C  08/18/2016   Pap with HPV screening  Never done   COVID-19 Vaccine (4 - 2024-25 season) 04/10/2023*   Yearly kidney function blood test for diabetes  03/10/2024   DTaP/Tdap/Td vaccine (4 - Td or Tdap) 07/09/2031   Flu Shot  Completed   HPV Vaccine  Completed   Hepatitis C Screening  Completed   HIV Screening  Completed  *Topic was postponed. The date shown is not the original due date.     No orders of the defined types were placed in this encounter.   Follow-up: Return in about 1 year (around 04/09/2024) for Annual Physical, 3 month for med check  .  An After Visit Summary was printed and given to the patient.  Lajuana Matte, FNP [facility] [phone]

## 2023-04-11 ENCOUNTER — Other Ambulatory Visit (HOSPITAL_COMMUNITY): Payer: Self-pay

## 2023-04-11 ENCOUNTER — Other Ambulatory Visit: Payer: Self-pay | Admitting: Family Medicine

## 2023-04-11 MED ORDER — LEVOTHYROXINE SODIUM 50 MCG PO TABS
50.0000 ug | ORAL_TABLET | Freq: Every day | ORAL | 11 refills | Status: DC
  Filled 2023-04-11: qty 30, 30d supply, fill #0
  Filled 2023-05-15: qty 30, 30d supply, fill #1
  Filled 2023-07-07: qty 30, 30d supply, fill #2
  Filled 2023-09-24: qty 30, 30d supply, fill #3
  Filled 2023-10-28: qty 30, 30d supply, fill #4
  Filled 2023-12-03: qty 30, 30d supply, fill #5
  Filled 2024-01-22: qty 30, 30d supply, fill #6
  Filled 2024-02-21: qty 30, 30d supply, fill #7
  Filled 2024-03-20: qty 30, 30d supply, fill #8

## 2023-04-13 ENCOUNTER — Other Ambulatory Visit (HOSPITAL_COMMUNITY): Payer: Self-pay

## 2023-04-15 ENCOUNTER — Other Ambulatory Visit: Payer: Self-pay

## 2023-04-15 ENCOUNTER — Other Ambulatory Visit (HOSPITAL_COMMUNITY): Payer: Self-pay

## 2023-04-15 DIAGNOSIS — E039 Hypothyroidism, unspecified: Secondary | ICD-10-CM

## 2023-04-15 MED ORDER — LEVOTHYROXINE SODIUM 50 MCG PO TABS
50.0000 ug | ORAL_TABLET | Freq: Every day | ORAL | 0 refills | Status: DC
Start: 2023-04-15 — End: 2023-09-16
  Filled 2023-04-15: qty 30, 30d supply, fill #0

## 2023-04-17 DIAGNOSIS — E039 Hypothyroidism, unspecified: Secondary | ICD-10-CM | POA: Diagnosis not present

## 2023-04-17 DIAGNOSIS — E2749 Other adrenocortical insufficiency: Secondary | ICD-10-CM | POA: Diagnosis not present

## 2023-04-24 ENCOUNTER — Other Ambulatory Visit: Payer: Self-pay

## 2023-04-24 ENCOUNTER — Telehealth: Payer: Commercial Managed Care - PPO | Admitting: Emergency Medicine

## 2023-04-24 DIAGNOSIS — J329 Chronic sinusitis, unspecified: Secondary | ICD-10-CM

## 2023-04-24 MED ORDER — IPRATROPIUM BROMIDE 0.03 % NA SOLN
2.0000 | Freq: Two times a day (BID) | NASAL | 0 refills | Status: DC
  Filled 2023-04-24: qty 30, 75d supply, fill #0

## 2023-04-24 NOTE — Progress Notes (Signed)
E-Visit for Sinus Problems  We are sorry that you are not feeling well.  Here is how we plan to help!  Based on what you have shared with me it looks like you have sinusitis.  Sinusitis is inflammation and infection in the sinus cavities of the head.  Based on your presentation I believe you most likely have Acute Viral Sinusitis.This is an infection most likely caused by a virus. There is not specific treatment for viral sinusitis other than to help you with the symptoms until the infection runs its course.  You may use an oral decongestant such as Mucinex D or if you have glaucoma or high blood pressure use plain Mucinex. Saline nasal spray help and can safely be used as often as needed for congestion, I have prescribed: Ipratropium Bromide nasal spray 0.03% 2 sprays in eah nostril 2-3 times a day  Some authorities believe that zinc sprays or the use of Echinacea may shorten the course of your symptoms.  Sinus infections are not as easily transmitted as other respiratory infection, however we still recommend that you avoid close contact with loved ones, especially the very young and elderly.  Remember to wash your hands thoroughly throughout the day as this is the number one way to prevent the spread of infection!  You don't meet criteria for antibiotic treatment at this point.  Let us know if you're still sick after 7+ days of symptoms.  Home Care: Only take medications as instructed by your medical team. Do not take these medications with alcohol. A steam or ultrasonic humidifier can help congestion.  You can place a towel over your head and breathe in the steam from hot water coming from a faucet. Avoid close contacts especially the very young and the elderly. Cover your mouth when you cough or sneeze. Always remember to wash your hands.  Get Help Right Away If: You develop worsening fever or sinus pain. You develop a severe head ache or visual changes. Your symptoms persist after you  have completed your treatment plan.  Make sure you Understand these instructions. Will watch your condition. Will get help right away if you are not doing well or get worse.   Thank you for choosing an e-visit.  Your e-visit answers were reviewed by a board certified advanced clinical practitioner to complete your personal care plan. Depending upon the condition, your plan could have included both over the counter or prescription medications.  Please review your pharmacy choice. Make sure the pharmacy is open so you can pick up prescription now. If there is a problem, you may contact your provider through [employer] and have the prescription routed to another pharmacy.  Your safety is important to [place]. If you have drug allergies check your prescription carefully.   For the next 24 hours you can use MyChart to ask questions about today's visit, request a non-urgent call back, or ask for a work or school excuse. You will get an email in the next two days asking about your experience. I hope that your e-visit has been valuable and will speed your recovery.   Approximately 5 minutes was used in reviewing the patient's chart, questionnaire, prescribing medications, and documentation.

## 2023-05-05 DIAGNOSIS — F4312 Post-traumatic stress disorder, chronic: Secondary | ICD-10-CM | POA: Diagnosis not present

## 2023-05-05 DIAGNOSIS — F411 Generalized anxiety disorder: Secondary | ICD-10-CM | POA: Diagnosis not present

## 2023-05-05 DIAGNOSIS — F900 Attention-deficit hyperactivity disorder, predominantly inattentive type: Secondary | ICD-10-CM | POA: Diagnosis not present

## 2023-05-11 ENCOUNTER — Encounter: Payer: Self-pay | Admitting: Family Medicine

## 2023-05-11 DIAGNOSIS — F4312 Post-traumatic stress disorder, chronic: Secondary | ICD-10-CM | POA: Diagnosis not present

## 2023-05-11 DIAGNOSIS — F411 Generalized anxiety disorder: Secondary | ICD-10-CM | POA: Diagnosis not present

## 2023-05-11 DIAGNOSIS — F900 Attention-deficit hyperactivity disorder, predominantly inattentive type: Secondary | ICD-10-CM | POA: Diagnosis not present

## 2023-05-15 ENCOUNTER — Other Ambulatory Visit: Payer: Self-pay

## 2023-05-15 ENCOUNTER — Ambulatory Visit: Payer: Commercial Managed Care - PPO | Admitting: Nurse Practitioner

## 2023-05-15 ENCOUNTER — Other Ambulatory Visit (HOSPITAL_COMMUNITY): Payer: Self-pay

## 2023-05-18 ENCOUNTER — Other Ambulatory Visit (HOSPITAL_COMMUNITY): Payer: Self-pay

## 2023-05-18 DIAGNOSIS — F411 Generalized anxiety disorder: Secondary | ICD-10-CM | POA: Diagnosis not present

## 2023-05-18 DIAGNOSIS — F900 Attention-deficit hyperactivity disorder, predominantly inattentive type: Secondary | ICD-10-CM | POA: Diagnosis not present

## 2023-05-18 DIAGNOSIS — F4312 Post-traumatic stress disorder, chronic: Secondary | ICD-10-CM | POA: Diagnosis not present

## 2023-05-18 MED ORDER — NA SULFATE-K SULFATE-MG SULF 17.5-3.13-1.6 GM/177ML PO SOLN
ORAL | 0 refills | Status: DC
  Filled 2023-05-18: qty 354, 2d supply, fill #0

## 2023-05-21 ENCOUNTER — Other Ambulatory Visit: Payer: Self-pay

## 2023-05-21 ENCOUNTER — Other Ambulatory Visit (HOSPITAL_COMMUNITY): Payer: Self-pay

## 2023-05-21 MED ORDER — NA SULFATE-K SULFATE-MG SULF 17.5-3.13-1.6 GM/177ML PO SOLN
177.0000 mg | Freq: Two times a day (BID) | ORAL | 0 refills | Status: DC
  Filled 2023-05-21: qty 354, 1d supply, fill #0

## 2023-06-01 DIAGNOSIS — R197 Diarrhea, unspecified: Secondary | ICD-10-CM | POA: Diagnosis not present

## 2023-06-01 DIAGNOSIS — K6389 Other specified diseases of intestine: Secondary | ICD-10-CM | POA: Diagnosis not present

## 2023-06-04 ENCOUNTER — Other Ambulatory Visit: Payer: Self-pay

## 2023-06-04 ENCOUNTER — Other Ambulatory Visit (HOSPITAL_COMMUNITY): Payer: Self-pay

## 2023-06-04 MED ORDER — COLESTIPOL HCL 1 G PO TABS
1.0000 g | ORAL_TABLET | Freq: Every day | ORAL | 2 refills | Status: DC
  Filled 2023-06-04: qty 30, 30d supply, fill #0
  Filled 2023-07-07: qty 30, 30d supply, fill #1

## 2023-06-10 DIAGNOSIS — F411 Generalized anxiety disorder: Secondary | ICD-10-CM | POA: Diagnosis not present

## 2023-06-10 DIAGNOSIS — F900 Attention-deficit hyperactivity disorder, predominantly inattentive type: Secondary | ICD-10-CM | POA: Diagnosis not present

## 2023-06-10 DIAGNOSIS — F4312 Post-traumatic stress disorder, chronic: Secondary | ICD-10-CM | POA: Diagnosis not present

## 2023-06-11 ENCOUNTER — Other Ambulatory Visit (HOSPITAL_COMMUNITY): Payer: Self-pay

## 2023-06-29 DIAGNOSIS — F4312 Post-traumatic stress disorder, chronic: Secondary | ICD-10-CM | POA: Diagnosis not present

## 2023-06-29 DIAGNOSIS — F411 Generalized anxiety disorder: Secondary | ICD-10-CM | POA: Diagnosis not present

## 2023-06-29 DIAGNOSIS — F900 Attention-deficit hyperactivity disorder, predominantly inattentive type: Secondary | ICD-10-CM | POA: Diagnosis not present

## 2023-07-07 ENCOUNTER — Other Ambulatory Visit (HOSPITAL_COMMUNITY): Payer: Self-pay

## 2023-07-13 DIAGNOSIS — F411 Generalized anxiety disorder: Secondary | ICD-10-CM | POA: Diagnosis not present

## 2023-07-13 DIAGNOSIS — F900 Attention-deficit hyperactivity disorder, predominantly inattentive type: Secondary | ICD-10-CM | POA: Diagnosis not present

## 2023-07-13 DIAGNOSIS — F4312 Post-traumatic stress disorder, chronic: Secondary | ICD-10-CM | POA: Diagnosis not present

## 2023-07-27 DIAGNOSIS — F411 Generalized anxiety disorder: Secondary | ICD-10-CM | POA: Diagnosis not present

## 2023-07-27 DIAGNOSIS — F4312 Post-traumatic stress disorder, chronic: Secondary | ICD-10-CM | POA: Diagnosis not present

## 2023-07-27 DIAGNOSIS — F900 Attention-deficit hyperactivity disorder, predominantly inattentive type: Secondary | ICD-10-CM | POA: Diagnosis not present

## 2023-07-30 ENCOUNTER — Other Ambulatory Visit (HOSPITAL_COMMUNITY): Payer: Self-pay

## 2023-07-31 ENCOUNTER — Other Ambulatory Visit: Payer: Self-pay

## 2023-08-12 ENCOUNTER — Other Ambulatory Visit (HOSPITAL_COMMUNITY): Payer: Self-pay

## 2023-08-12 ENCOUNTER — Other Ambulatory Visit: Payer: Self-pay

## 2023-08-12 ENCOUNTER — Telehealth: Admitting: Family Medicine

## 2023-08-12 ENCOUNTER — Encounter: Payer: Self-pay | Admitting: Family Medicine

## 2023-08-12 VITALS — HR 83

## 2023-08-12 DIAGNOSIS — F900 Attention-deficit hyperactivity disorder, predominantly inattentive type: Secondary | ICD-10-CM | POA: Diagnosis not present

## 2023-08-12 DIAGNOSIS — E039 Hypothyroidism, unspecified: Secondary | ICD-10-CM

## 2023-08-12 DIAGNOSIS — F4312 Post-traumatic stress disorder, chronic: Secondary | ICD-10-CM | POA: Diagnosis not present

## 2023-08-12 DIAGNOSIS — F411 Generalized anxiety disorder: Secondary | ICD-10-CM | POA: Diagnosis not present

## 2023-08-12 MED ORDER — AMPHETAMINE-DEXTROAMPHETAMINE 12.5 MG PO TABS
12.5000 mg | ORAL_TABLET | Freq: Two times a day (BID) | ORAL | 0 refills | Status: DC
Start: 2023-08-12 — End: 2023-10-06
  Filled 2023-08-12: qty 60, 30d supply, fill #0

## 2023-08-12 NOTE — Progress Notes (Signed)
 Virtual Visit via Video Note   This visit type was conducted per patient request This format is felt to be appropriate for this patient at this time.  All issues noted in this document were discussed and addressed.  A limited physical exam was performed with this format.  A verbal consent was obtained for the virtual visit.   Date:  08/12/2023   ID:  [name] Blanchard, DOB [birth year], MRN [id number]  Patient Location: Home Provider Location: Office/Clinic  PCP:  Renne Crigler, FNP   Chief Complaint  Patient presents with   ADHD    Would like to get back on her meds for ADHD     History of Present Illness:    Discussed the use of AI scribe software for clinical note transcription with the patient, who gave verbal consent to proceed.    [name] Blanchard is a 33 year old female with ADHD who presents for medication management.  She has a history of ADHD and was previously on Adderall, starting at 12.5 mg and increasing to 15 mg in 2022. She is considering restarting Adderall at 12.5 mg twice a day. Her primary symptom related to ADHD is difficulty focusing. No hyperactivity, indicating predominantly inattentive ADHD.  Her thyroid function was last checked in November. No symptoms such as hair loss, chest pain, shortness of breath, palpitations, headaches, or dizziness.  Recent lab work showed normal kidney and liver function, a slightly elevated glucose level, and a normal blood count. She attributes the elevated glucose to possibly having eaten before the test.  No issues with her sinuses during the allergy season.       The patient does not have symptoms concerning for COVID-19 infection (fever, chills, cough, or new shortness of breath).    Past Medical History:  Diagnosis Date   Anxiety    diagnosed in 2012   Dysmenorrhea    Gestational diabetes    Pancreatitis    necrotizing pancreatitis w/ sepsis    Past Surgical History:  Procedure Laterality Date    ADENOIDECTOMY     33y/o   CESAREAN SECTION N/A 09/18/2021   Procedure: CESAREAN SECTION;  Surgeon: Marcelle Overlie, MD;  Location: [facility] LD ORS;  Service: Obstetrics;  Laterality: N/A;   CHOLECYSTECTOMY     KNEE SURGERY Right 09/10/2010   Dr. Luiz Blare   TONSILLECTOMY     33 y/o   WISDOM TOOTH EXTRACTION     unsure if 2 or 4 teeth    Family History  Problem Relation Age of Onset   Hypertension Father    Diabetes Father    Hypertension Maternal Grandmother    Diabetes Maternal Grandmother    Hypertension Maternal Grandfather    Diabetes Maternal Grandfather    Hypertension Paternal Grandmother    Diabetes Paternal Grandmother    Hypertension Paternal Grandfather    Diabetes Paternal Grandfather     Social History   Socioeconomic History   Marital status: Single    Spouse name: Not on file   Number of children: Not on file   Years of education: Not on file   Highest education level: Not on file  Occupational History   Occupation: student  Tobacco Use   Smoking status: Never   Smokeless tobacco: Never  Substance and Sexual Activity   Alcohol use: Not Currently    Comment: Occasionally few mixed drinks with friends   Drug use: No   Sexual activity: Yes  Other Topics Concern   Not on file  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   Financial Resource Strain: Low Risk  (04/10/2023)   Overall Financial Resource Strain (CARDIA)    Difficulty of Paying Living Expenses: Not hard at all  Food Insecurity: No Food Insecurity (04/10/2023)   Hunger Vital Sign    Worried About Running Out of Food in the Last Year: Never true    Ran Out of Food in the Last Year: Never true  Transportation Needs: No Transportation Needs (04/10/2023)   PRAPARE - Administrator, Civil Service (Medical): No    Lack of Transportation (Non-Medical): No  Physical Activity: Not on file  Stress: No Stress Concern Present (04/10/2023)   [employer]  of Occupational Health - Occupational Stress Questionnaire    Feeling of Stress : Not at all  Social Connections: Unknown (02/12/2023)   Received from [facility]   Social Network    Social Network: Not on file  Intimate Partner Violence: Not At Risk (04/10/2023)   Humiliation, Afraid, Rape, and Kick questionnaire    Fear of Current or Ex-Partner: No    Emotionally Abused: No    Physically Abused: No    Sexually Abused: No    Outpatient Medications Prior to Visit  Medication Sig Dispense Refill   colestipol (COLESTID) 1 g tablet Take 1 tablet (1 g total) by mouth daily. 30 tablet 2   Continuous Glucose Sensor (FREESTYLE LIBRE 3 PLUS SENSOR) MISC Use 1 sensor every 15 days. 2 each 5   Continuous Glucose Sensor (FREESTYLE LIBRE 3 SENSOR) MISC Change sensor every 2 weeks 2 each 5   levothyroxine (SYNTHROID) 50 MCG tablet Take 1 tablet (50 mcg total) by mouth daily. 30 tablet 11   levothyroxine (SYNTHROID) 50 MCG tablet Take 1 tablet (50 mcg total) by mouth daily. 30 tablet 0   Multiple Vitamin (MULTIVITAMIN) capsule Take 1 capsule by mouth daily.     Rimegepant Sulfate (NURTEC) 75 MG TBDP Take 1 tablet (75 mg total) by mouth as needed (migraine). 30 tablet 2   Phentermine HCl (LOMAIRA) 8 MG TABS Take 1 tablet (8 mg) by mouth daily 30 minutes before breakfast. (Patient not taking: Reported on 08/12/2023) 30 tablet 4   ipratropium (ATROVENT) 0.03 % nasal spray Place 2 sprays into both nostrils every 12 (twelve) hours. 30 mL 0   Na Sulfate-K Sulfate-Mg Sulfate concentrate 17.5-3.13-1.6 GM/177ML SOLN Take 177 mLs by mouth 2 (two) times. 354 mL 0   No facility-administered medications prior to visit.    Allergies  Allergen Reactions   Morphine Other (See Comments)    Respiratory rate dropped   Ativan [Lorazepam] Other (See Comments)    Hallucinations    Compazine [Prochlorperazine]     Dystonic reaction. Anxious, panicking, tearful.   Reglan [Metoclopramide]     Dystonic reaction.  Combative disoriented     Social History   Tobacco Use   Smoking status: Never   Smokeless tobacco: Never  Substance Use Topics   Alcohol use: Not Currently    Comment: Occasionally few mixed drinks with friends   Drug use: No     Review of Systems  Constitutional:  Negative for chills, fever and malaise/fatigue.  HENT:  Negative for congestion, sinus pain and sore throat.           Respiratory:  Negative for cough and shortness of breath.   Cardiovascular:  Negative for chest pain and palpitations.  Gastrointestinal:  Negative for diarrhea, nausea and vomiting.  Genitourinary: Negative.   Neurological:  Positive for headaches (occosionaly). Negative for dizziness.  Psychiatric/Behavioral:  Negative for depression. The patient is not nervous/anxious.        Inability to focus     Labs/Other Tests and Data Reviewed:    Recent Labs: 03/11/2023: ALT 20; BUN 11; Creatinine, Ser 0.87; Hemoglobin 13.4; Platelets 339; Potassium 4.5; Sodium 139; TSH 4.980   Recent Lipid Panel Lab Results  Component Value Date/Time   CHOL 115 03/11/2023 10:40 AM   TRIG 42 03/11/2023 10:40 AM   HDL 58 03/11/2023 10:40 AM   CHOLHDL 2.0 03/11/2023 10:40 AM   LDLCALC 46 03/11/2023 10:40 AM    Wt Readings from Last 3 Encounters:  04/10/23 190 lb (86.2 kg)  03/11/23 186 lb 3.2 oz (84.5 kg)  07/28/22 204 lb (92.5 kg)     Objective:    Vital Signs:  Pulse 83   SpO2 99%    Physical Exam Constitutional:      General: She is not in acute distress.    Appearance: Normal appearance. She is not ill-appearing.  Eyes:     Conjunctiva/sclera: Conjunctivae normal.  Pulmonary:     Effort: Pulmonary effort is normal.     Breath sounds: Normal breath sounds. No wheezing.  Musculoskeletal:     Cervical back: Normal range of motion.  Neurological:     Mental Status: She is alert. Mental status is at baseline.  Psychiatric:        Mood and Affect: Mood normal.        Thought Content: Thought  content normal.      ASSESSMENT & PLAN:   Acquired hypothyroidism Assessment & Plan: Thyroid function last assessed in November. Re-evaluation necessary due to potential mood and functional impacts of thyroid imbalances. Adderall does not typically affect thyroid function, but monitoring is prudent. - Order thyroid function tests. - Schedule lab visit for thyroid testing tomorrow  Orders: -     TSH; Future -     T4, free; Future  Attention deficit hyperactivity disorder (ADHD), predominantly inattentive type Assessment & Plan: ADHD, predominantly inattentive type, affecting focus. Restarted Adderall at 12.5 mg twice daily. Aware of side effects: insomnia, dizziness, agitation, anxiety, weight loss, abdominal pain, headaches, dry mouth. Advised to avoid evening doses to prevent insomnia. Discussed pregnancy and lactation warnings associated with Adderall. - Prescribe Adderall 12.5 mg twice daily. - Advise to avoid evening doses. - Discuss pregnancy and lactation warnings.  Orders: -     Amphetamine-Dextroamphetamine; Take 1 tablet by mouth 2 (two) times daily.  Dispense: 60 tablet; Refill: 0      Follow Up:  In Person  for thyroid labs  Signed, Delford Felling, FNP [facility] [phone]

## 2023-08-12 NOTE — Assessment & Plan Note (Signed)
 ADHD, predominantly inattentive type, affecting focus. Restarted Adderall at 12.5 mg twice daily. Aware of side effects: insomnia, dizziness, agitation, anxiety, weight loss, abdominal pain, headaches, dry mouth. Advised to avoid evening doses to prevent insomnia. Discussed pregnancy and lactation warnings associated with Adderall. - Prescribe Adderall 12.5 mg twice daily. - Advise to avoid evening doses. - Discuss pregnancy and lactation warnings.

## 2023-08-12 NOTE — Assessment & Plan Note (Addendum)
 Thyroid function last assessed in November. Re-evaluation necessary due to potential mood and functional impacts of thyroid imbalances. Adderall does not typically affect thyroid function, but monitoring is prudent. - Order thyroid function tests. - Schedule lab visit for thyroid testing tomorrow

## 2023-08-13 ENCOUNTER — Other Ambulatory Visit

## 2023-08-13 DIAGNOSIS — E039 Hypothyroidism, unspecified: Secondary | ICD-10-CM | POA: Diagnosis not present

## 2023-08-14 LAB — T4, FREE: Free T4: 1.16 ng/dL (ref 0.82–1.77)

## 2023-08-14 LAB — TSH: TSH: 3.34 u[IU]/mL (ref 0.450–4.500)

## 2023-08-25 ENCOUNTER — Other Ambulatory Visit (HOSPITAL_COMMUNITY): Payer: Self-pay

## 2023-08-28 DIAGNOSIS — F411 Generalized anxiety disorder: Secondary | ICD-10-CM | POA: Diagnosis not present

## 2023-08-28 DIAGNOSIS — F4312 Post-traumatic stress disorder, chronic: Secondary | ICD-10-CM | POA: Diagnosis not present

## 2023-08-28 DIAGNOSIS — F900 Attention-deficit hyperactivity disorder, predominantly inattentive type: Secondary | ICD-10-CM | POA: Diagnosis not present

## 2023-09-01 DIAGNOSIS — R197 Diarrhea, unspecified: Secondary | ICD-10-CM | POA: Diagnosis not present

## 2023-09-16 ENCOUNTER — Ambulatory Visit: Admitting: Family Medicine

## 2023-09-16 ENCOUNTER — Encounter: Payer: Self-pay | Admitting: Family Medicine

## 2023-09-16 VITALS — BP 102/60 | HR 79 | Temp 98.0°F | Resp 16 | Ht 62.0 in | Wt 195.8 lb

## 2023-09-16 DIAGNOSIS — D1801 Hemangioma of skin and subcutaneous tissue: Secondary | ICD-10-CM | POA: Insufficient documentation

## 2023-09-16 NOTE — Progress Notes (Signed)
 Subjective:  Patient ID: [name] Blanchard, female    DOB: 1990/09/10  Age: 33 y.o. MRN: [id number]  Chief Complaint  Patient presents with   Rash    Discussed the use of AI scribe software for clinical note transcription with the patient, who gave verbal consent to proceed.  History of Present Illness   [name] Blanchard is a 33 year old female who presents with new skin lesions resembling cherry angiomas.  She noticed new skin lesions last week while at the beach and pool. Initially, she observed one lesion and then noticed multiple others. They do not resemble bug bites or freckles and are new. She is unsure of their origin and is seeking clarification on their nature.  Her diabetes is managed by an endocrinologist, with lab work and A1c testing last done the previous year. She is scheduled to see her endocrinologist next week.  She recalls having pneumonia when she contracted COVID-19 but does not recall receiving a pneumonia vaccine. She has a history of frequent bronchitis. Prefers to wait to get the pneumonia vaccine.         04/10/2023    9:47 AM 03/11/2023    9:48 AM 07/03/2021    1:35 PM  Depression screen PHQ 2/9  Decreased Interest 0 0 0  Down, Depressed, Hopeless 0 0 0  PHQ - 2 Score 0 0 0        04/10/2023    9:48 AM  Fall Risk   Falls in the past year? 0  Number falls in past yr: 0  Injury with Fall? 0  Risk for fall due to : No Fall Risks  Follow up Falls evaluation completed    Patient Care Team: Janece Means, FNP as PCP - General (Family Medicine) Tasia Farr, MD as Referring Physician (Endocrinology) Exie Holler, MD as Referring Physician (Internal Medicine)   Review of Systems  Constitutional:  Negative for chills, diaphoresis, fatigue and fever.  HENT:  Negative for congestion, ear pain and sinus pain.   Eyes: Negative.   Respiratory:  Negative for cough and shortness of breath.   Cardiovascular:  Negative for chest pain.   Gastrointestinal:  Negative for abdominal pain, constipation, nausea and vomiting.  Endocrine: Negative.   Genitourinary:  Negative for dysuria.  Musculoskeletal:  Negative for arthralgias.  Skin: Negative.   Allergic/Immunologic: Negative.   Neurological:  Negative for weakness and headaches.  Hematological: Negative.   Psychiatric/Behavioral:  Negative for dysphoric mood. The patient is not nervous/anxious.   All other systems reviewed and are negative.   Current Outpatient Medications on File Prior to Visit  Medication Sig Dispense Refill   amphetamine -dextroamphetamine  (ADDERALL) 12.5 MG tablet Take 1 tablet by mouth 2 (two) times daily. 60 tablet 0   cetirizine (ZYRTEC) 10 MG tablet Take 10 mg by mouth daily.     Continuous Glucose Sensor (FREESTYLE LIBRE 3 PLUS SENSOR) MISC Use 1 sensor every 15 days. 2 each 5   levothyroxine  (SYNTHROID ) 50 MCG tablet Take 1 tablet (50 mcg total) by mouth daily. 30 tablet 11   Multiple Vitamin (MULTIVITAMIN) capsule Take 1 capsule by mouth daily.     Probiotic Product (PROBIOTIC ACIDOPHILUS) CHEW Chew 1 each by mouth daily.     Rimegepant Sulfate  (NURTEC) 75 MG TBDP Take 1 tablet (75 mg total) by mouth as needed (migraine). 30 tablet 2   [DISCONTINUED] norgestimate -ethinyl estradiol  (SPRINTEC  28) 0.25-35 MG-MCG tablet Take 1 tablet by mouth daily. 84 tablet 3   No  current facility-administered medications on file prior to visit.   Past Medical History:  Diagnosis Date   Anxiety    diagnosed in 2012   Dysmenorrhea    Gestational diabetes    Pancreatitis    necrotizing pancreatitis w/ sepsis   Past Surgical History:  Procedure Laterality Date   ADENOIDECTOMY     33y/o   CESAREAN SECTION N/A 09/18/2021   Procedure: CESAREAN SECTION;  Surgeon: Thurman Flores, MD;  Location: [facility];  Service: Obstetrics;  Laterality: N/A;   CHOLECYSTECTOMY     KNEE SURGERY Right 09/10/2010   Dr. Murrell Arrant   TONSILLECTOMY     33 y/o   WISDOM TOOTH  EXTRACTION     unsure if 2 or 4 teeth    Family History  Problem Relation Age of Onset   Hypertension Father    Diabetes Father    Hypertension Maternal Grandmother    Diabetes Maternal Grandmother    Hypertension Maternal Grandfather    Diabetes Maternal Grandfather    Hypertension Paternal Grandmother    Diabetes Paternal Grandmother    Hypertension Paternal Grandfather    Diabetes Paternal Grandfather    Social History   Socioeconomic History   Marital status: Single    Spouse name: Not on file   Number of children: Not on file   Years of education: Not on file   Highest education level: Not on file  Occupational History   Occupation: student  Tobacco Use   Smoking status: Never   Smokeless tobacco: Never  Substance and Sexual Activity   Alcohol use: Not Currently    Comment: Occasionally few mixed drinks with friends   Drug use: No   Sexual activity: Yes  Other Topics Concern   Not on file  Social History Narrative   Emergency department RN   Social Drivers of Health   Financial Resource Strain: Low Risk  (04/10/2023)   Overall Financial Resource Strain (CARDIA)    Difficulty of Paying Living Expenses: Not hard at all  Food Insecurity: No Food Insecurity (04/10/2023)   Hunger Vital Sign    Worried About Running Out of Food in the Last Year: Never true    Ran Out of Food in the Last Year: Never true  Transportation Needs: No Transportation Needs (04/10/2023)   PRAPARE - Administrator, Civil Service (Medical): No    Lack of Transportation (Non-Medical): No  Physical Activity: Not on file  Stress: No Stress Concern Present (04/10/2023)   [employer] of Occupational Health - Occupational Stress Questionnaire    Feeling of Stress : Not at all  Social Connections: Unknown (02/12/2023)   Received from [employer]   Social Network    Social Network: Not on file    Objective:  BP 102/60   Pulse 79   Temp 98 F (36.7 C) (Temporal)    Resp 16   Ht 5\' 2"  (1.575 m)   Wt 195 lb 12.8 oz (88.8 kg)   LMP 08/29/2023   SpO2 98%   BMI 35.81 kg/m      09/16/2023   11:06 AM 04/10/2023    9:44 AM 03/11/2023    9:37 AM  BP/Weight  Systolic BP 102 98 108  Diastolic BP 60 68 70  Wt. (Lbs) 195.8 190 186.2  BMI 35.81 kg/m2 34.75 kg/m2 34.06 kg/m2    Physical Exam Vitals reviewed.  Constitutional:      General: She is not in acute distress.    Appearance: Normal appearance.  Eyes:     Conjunctiva/sclera: Conjunctivae normal.  Neck:     Vascular: No carotid bruit.  Cardiovascular:     Rate and Rhythm: Normal rate and regular rhythm.     Heart sounds: Normal heart sounds.  Pulmonary:     Effort: Pulmonary effort is normal.     Breath sounds: Normal breath sounds.  Skin:    Findings: Lesion present.     Comments: See photos  Neurological:     Mental Status: She is alert. Mental status is at baseline.  Psychiatric:        Mood and Affect: Mood normal.        Behavior: Behavior normal.       Lab Results  Component Value Date   WBC 9.1 03/11/2023   HGB 13.4 03/11/2023   HCT 41.6 03/11/2023   PLT 339 03/11/2023   GLUCOSE 115 (H) 03/11/2023   CHOL 115 03/11/2023   TRIG 42 03/11/2023   HDL 58 03/11/2023   LDLCALC 46 03/11/2023   ALT 20 03/11/2023   AST 20 03/11/2023   NA 139 03/11/2023   K 4.5 03/11/2023   CL 100 03/11/2023   CREATININE 0.87 03/11/2023   BUN 11 03/11/2023   CO2 21 03/11/2023   TSH 3.340 08/13/2023   INR 0.99 08/06/2014   HGBA1C 5.5 02/18/2016      Assessment & Plan:  Cherry angioma Assessment & Plan: Multiple benign cherry angiomas present. Discussed benign nature, monitoring for changes, and potential for bleeding if traumatized. - Monitor for changes in size, shape, or color. - Refer to dermatology if changes are noted.    Follow-up: Return in about 6 months (around 03/18/2024) for Annual Physical.    An After Visit Summary was printed and given to the patient.  Delford Felling, FNP [facility] [phone]

## 2023-09-16 NOTE — Assessment & Plan Note (Signed)
 Multiple benign cherry angiomas present. Discussed benign nature, monitoring for changes, and potential for bleeding if traumatized. - Monitor for changes in size, shape, or color. - Refer to dermatology if changes are noted.

## 2023-09-17 DIAGNOSIS — O24419 Gestational diabetes mellitus in pregnancy, unspecified control: Secondary | ICD-10-CM | POA: Diagnosis not present

## 2023-09-17 DIAGNOSIS — E2749 Other adrenocortical insufficiency: Secondary | ICD-10-CM | POA: Diagnosis not present

## 2023-09-17 DIAGNOSIS — E039 Hypothyroidism, unspecified: Secondary | ICD-10-CM | POA: Diagnosis not present

## 2023-09-24 ENCOUNTER — Other Ambulatory Visit: Payer: Self-pay

## 2023-09-24 ENCOUNTER — Other Ambulatory Visit (HOSPITAL_COMMUNITY): Payer: Self-pay

## 2023-09-24 DIAGNOSIS — Z794 Long term (current) use of insulin: Secondary | ICD-10-CM | POA: Diagnosis not present

## 2023-09-24 DIAGNOSIS — E1165 Type 2 diabetes mellitus with hyperglycemia: Secondary | ICD-10-CM | POA: Diagnosis not present

## 2023-09-24 DIAGNOSIS — K859 Acute pancreatitis without necrosis or infection, unspecified: Secondary | ICD-10-CM | POA: Diagnosis not present

## 2023-09-24 DIAGNOSIS — F900 Attention-deficit hyperactivity disorder, predominantly inattentive type: Secondary | ICD-10-CM | POA: Diagnosis not present

## 2023-09-24 DIAGNOSIS — N926 Irregular menstruation, unspecified: Secondary | ICD-10-CM | POA: Diagnosis not present

## 2023-09-24 DIAGNOSIS — F4312 Post-traumatic stress disorder, chronic: Secondary | ICD-10-CM | POA: Diagnosis not present

## 2023-09-24 DIAGNOSIS — F411 Generalized anxiety disorder: Secondary | ICD-10-CM | POA: Diagnosis not present

## 2023-09-24 MED ORDER — LOMAIRA 8 MG PO TABS
8.0000 mg | ORAL_TABLET | Freq: Every day | ORAL | 5 refills | Status: DC
  Filled 2023-09-24: qty 30, 30d supply, fill #0
  Filled 2023-10-28: qty 30, 30d supply, fill #1
  Filled 2023-12-03: qty 30, 30d supply, fill #2
  Filled 2024-01-08: qty 30, 30d supply, fill #3
  Filled 2024-01-22 – 2024-02-21 (×2): qty 30, 30d supply, fill #4
  Filled 2024-03-20: qty 30, 30d supply, fill #5

## 2023-09-25 ENCOUNTER — Other Ambulatory Visit: Payer: Self-pay

## 2023-10-05 DIAGNOSIS — F411 Generalized anxiety disorder: Secondary | ICD-10-CM | POA: Diagnosis not present

## 2023-10-05 DIAGNOSIS — F4312 Post-traumatic stress disorder, chronic: Secondary | ICD-10-CM | POA: Diagnosis not present

## 2023-10-05 DIAGNOSIS — F900 Attention-deficit hyperactivity disorder, predominantly inattentive type: Secondary | ICD-10-CM | POA: Diagnosis not present

## 2023-10-06 ENCOUNTER — Telehealth (INDEPENDENT_AMBULATORY_CARE_PROVIDER_SITE_OTHER)

## 2023-10-06 ENCOUNTER — Ambulatory Visit: Payer: Self-pay

## 2023-10-06 VITALS — HR 110 | Ht 62.0 in | Wt 195.0 lb

## 2023-10-06 DIAGNOSIS — R051 Acute cough: Secondary | ICD-10-CM | POA: Insufficient documentation

## 2023-10-06 DIAGNOSIS — R0602 Shortness of breath: Secondary | ICD-10-CM | POA: Diagnosis not present

## 2023-10-06 MED ORDER — PREDNISONE 20 MG PO TABS: 40.0000 mg | ORAL_TABLET | Freq: Every day | ORAL | 0 refills | Status: AC

## 2023-10-06 MED ORDER — BENZONATATE 200 MG PO CAPS: 200.0000 mg | ORAL_CAPSULE | Freq: Three times a day (TID) | ORAL | 0 refills | Status: AC | PRN

## 2023-10-06 NOTE — Assessment & Plan Note (Signed)
 Presents with cough, shortness of breath, and tachycardia (heart rate 110 bpm) persisting for a week, suggesting a viral etiology. No asthma history,   Using OTC medications (Mucinex, Claritin, Zyrtec, DayQuil, NyQuil) for symptomatic relief.   Concern for pneumonia due to symptom severity, including exertional dyspnea. Prednisone considered as 99% of bronchitis cases are viral, not requiring antibiotics unless pneumonia is confirmed. - Prescribe prednisone 20 mg tabs, 2 tablets daily for 5 days. - Prescribe cough capsules, tessalon perles to be taken up to three times daily. - Order chest x-ray to rule out pneumonia. - Advise to maintain hydration. - Instruct to seek urgent care or emergency evaluation if symptoms worsen, such as severe dyspnea, chest pain, or oxygen saturation <90%.

## 2023-10-06 NOTE — Progress Notes (Signed)
 Virtual Visit via Video Note   This visit type was conducted per patient request This format is felt to be appropriate for this patient at this time.  All issues noted in this document were discussed and addressed.  A limited physical exam was performed with this format.  A verbal consent was obtained for the virtual visit.   Date:  10/06/2023   ID:  Shamari Trostel, DOB 1991-03-12, MRN [id number]  Patient Location: Home Provider Location: Office/Clinic  PCP:  Janece Means, FNP   Chief Complaint  Patient presents with   Cough   Shortness of Breath     History of Present Illness:    The patient does not have symptoms concerning for COVID-19 infection (fever, chills, cough, or new shortness of breath).   Blood sugar: 105 mg/dl  Discussed the use of AI scribe software for clinical note transcription with the patient, who gave verbal consent to proceed.  History of Present Illness   Sophiarose Laurinda Carreno is a 33 year old female who is contacted via telehealth for symptoms of  cough and shortness of breath for 1 week.   She has been experiencing a cough and shortness of breath for the past week. The shortness of breath is particularly noticeable during basic activities such as going to the bathroom. No chest pain is reported.  Her heart rate was noted to be 110 beats per minute this morning. She does not have access to a pulse oximeter to check her oxygen levels at home.  She has been using over-the-counter medications including Mucinex, Claritin or Zyrtec, and DayQuil or NyQuil to manage her symptoms.  She denies having a history of asthma.  No chest pain.       Past Medical History:  Diagnosis Date   Anxiety    diagnosed in 2012   Dysmenorrhea    Gestational diabetes    Pancreatitis    necrotizing pancreatitis w/ sepsis    Past Surgical History:  Procedure Laterality Date   ADENOIDECTOMY     33y/o   CESAREAN SECTION N/A 09/18/2021   Procedure: CESAREAN  SECTION;  Surgeon: Thurman Flores, MD;  Location: [facility] LD [facility]S;  Service: Obstetrics;  Laterality: N/A;   CHOLECYSTECTOMY     KNEE SURGERY Right 09/10/2010   Dr. Murrell Arrant   TONSILLECTOMY     33 y/o   WISDOM TOOTH EXTRACTION     unsure if 2 or 4 teeth    Family History  Problem Relation Age of Onset   Hypertension Father    Diabetes Father    Hypertension Maternal Grandmother    Diabetes Maternal Grandmother    Hypertension Maternal Grandfather    Diabetes Maternal Grandfather    Hypertension Paternal Grandmother    Diabetes Paternal Grandmother    Hypertension Paternal Grandfather    Diabetes Paternal Grandfather     Social History   Socioeconomic History   Marital status: Single    Spouse name: Not on file   Number of children: Not on file   Years of education: Not on file   Highest education level: Not on file  Occupational History   Occupation: student  Tobacco Use   Smoking status: Never   Smokeless tobacco: Never  Substance and Sexual Activity   Alcohol use: Not Currently    Comment: Occasionally few mixed drinks with friends   Drug use: No   Sexual activity: Yes  Other Topics Concern   Not on file  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   Financial Resource Strain: Low Risk  (04/10/2023)   Overall Financial Resource Strain (CARDIA)    Difficulty of Paying Living Expenses: Not hard at all  Food Insecurity: No Food Insecurity (04/10/2023)   Hunger Vital Sign    Worried About Running Out of Food in the Last Year: Never true    Ran Out of Food in the Last Year: Never true  Transportation Needs: No Transportation Needs (04/10/2023)   PRAPARE - Administrator, Civil Service (Medical): No    Lack of Transportation (Non-Medical): No  Physical Activity: Not on file  Stress: No Stress Concern Present (04/10/2023)   [employer] of Occupational Health - Occupational Stress Questionnaire    Feeling of Stress  : Not at all  Social Connections: Unknown (02/12/2023)   Received from [facility]   Social Network    Social Network: Not on file  Intimate Partner Violence: Not At Risk (04/10/2023)   Humiliation, Afraid, Rape, and Kick questionnaire    Fear of Current or Ex-Partner: No    Emotionally Abused: No    Physically Abused: No    Sexually Abused: No    Outpatient Medications Prior to Visit  Medication Sig Dispense Refill   cetirizine (ZYRTEC) 10 MG tablet Take 10 mg by mouth daily.     Continuous Glucose Sensor (FREESTYLE LIBRE 3 PLUS SENSOR) MISC Use 1 sensor every 15 days. 2 each 5   levothyroxine  (SYNTHROID ) 50 MCG tablet Take 1 tablet (50 mcg total) by mouth daily. 30 tablet 11   Multiple Vitamin (MULTIVITAMIN) capsule Take 1 capsule by mouth daily.     Phentermine  HCl (LOMAIRA ) 8 MG TABS Take 1 tablet (8 mg total) by mouth daily 30 minutes  before breakfast. 30 tablet 5   Probiotic Product (PROBIOTIC ACIDOPHILUS) CHEW Chew 1 each by mouth daily.     Rimegepant Sulfate  (NURTEC) 75 MG TBDP Take 1 tablet (75 mg total) by mouth as needed (migraine). 30 tablet 2   amphetamine -dextroamphetamine  (ADDERALL) 12.5 MG tablet Take 1 tablet by mouth 2 (two) times daily. 60 tablet 0   No facility-administered medications prior to visit.    Allergies  Allergen Reactions   Morphine  Other (See Comments)    Respiratory rate dropped   Ativan  [Lorazepam ] Other (See Comments)    Hallucinations    Compazine  [Prochlorperazine ]     Dystonic reaction. Anxious, panicking, tearful.   Reglan [Metoclopramide]     Dystonic reaction. Combative disoriented     Social History   Tobacco Use   Smoking status: Never   Smokeless tobacco: Never  Substance Use Topics   Alcohol use: Not Currently    Comment: Occasionally few mixed drinks with friends   Drug use: No     Review of Systems  Constitutional:  Positive for chills. Negative for diaphoresis, fever and malaise/fatigue.  HENT:  Positive for  congestion and sore throat. Negative for ear pain and sinus pain.   Respiratory:  Positive for cough, sputum production and shortness of breath. Negative for wheezing.   Gastrointestinal:  Positive for vomiting (mucus). Negative for abdominal pain, constipation, diarrhea and nausea.     Labs/Other Tests and Data Reviewed:    Recent Labs: 03/11/2023: ALT 20; BUN 11; Creatinine, Ser 0.87; Hemoglobin 13.4; Platelets 339; Potassium 4.5; Sodium 139 08/13/2023: TSH 3.340   Recent Lipid Panel Lab Results  Component Value Date/Time   CHOL 115 03/11/2023 10:40 AM   TRIG 42  03/11/2023 10:40 AM   HDL 58 03/11/2023 10:40 AM   CHOLHDL 2.0 03/11/2023 10:40 AM   LDLCALC 46 03/11/2023 10:40 AM    Wt Readings from Last 3 Encounters:  10/06/23 195 lb (88.5 kg)  09/16/23 195 lb 12.8 oz (88.8 kg)  04/10/23 190 lb (86.2 kg)     Objective:    Vital Signs:  Pulse (!) 110   Ht 5\' 2"  (1.575 m)   Wt 195 lb (88.5 kg)   LMP 08/29/2023   SpO2 96%   BMI 35.67 kg/m    Physical Exam Vitals reviewed.  Constitutional:      Comments: Coughing through the visit, Exam not possible as this is telehealth visit  Neurological:     Mental Status: She is alert.      ASSESSMENT & PLAN:   Shortness of breath -     DG Chest 2 View; Future  Acute cough Assessment & Plan: Presents with cough, shortness of breath, and tachycardia (heart rate 110 bpm) persisting for a week, suggesting a viral etiology. No asthma history,   Using OTC medications (Mucinex, Claritin, Zyrtec, DayQuil, NyQuil) for symptomatic relief.   Concern for pneumonia due to symptom severity, including exertional dyspnea. Prednisone considered as 99% of bronchitis cases are viral, not requiring antibiotics unless pneumonia is confirmed. - Prescribe prednisone 20 mg tabs, 2 tablets daily for 5 days. - Prescribe cough capsules, tessalon perles to be taken up to three times daily. - Order chest x-ray to rule out pneumonia. - Advise to  maintain hydration. - Instruct to seek urgent care or emergency evaluation if symptoms worsen, such as severe dyspnea, chest pain, or oxygen saturation <90%.    Orders: -     DG Chest 2 View; Future  Other orders -     predniSONE; Take 2 tablets (40 mg total) by mouth daily with breakfast for 5 days.  Dispense: 10 tablet; Refill: 0 -     Benzonatate; Take 1 capsule (200 mg total) by mouth 3 (three) times daily as needed for up to 7 days.  Dispense: 21 capsule; Refill: 0   Assessment and Plan           Orders Placed This Encounter  Procedures   DG Chest 2 View     Meds ordered this encounter  Medications   predniSONE (DELTASONE) 20 MG tablet    Sig: Take 2 tablets (40 mg total) by mouth daily with breakfast for 5 days.    Dispense:  10 tablet    Refill:  0   benzonatate (TESSALON) 200 MG capsule    Sig: Take 1 capsule (200 mg total) by mouth 3 (three) times daily as needed for up to 7 days.    Dispense:  21 capsule    Refill:  0     Follow Up:  In Person prn  Signed, Sherle Mello, MD  10/06/2023 9:36 AM    [facility]

## 2023-10-06 NOTE — Telephone Encounter (Signed)
 FYI Only or Action Required?: FYI only for provider  Patient was last seen in primary care on 10/06/2023 by Sirivol, Mamatha, MD. Called Nurse Triage reporting Breathing Problem. Symptoms began x 7 days ago and worsening. Interventions attempted: OTC medications: nyquil, dayquill. Symptoms are: gradually worsening.  Triage Disposition: See HCP Within 4 Hours (Or PCP Triage), See Physician Within 24 Hours: pt requesting virtual visit with a provider & appt scheduled for today.  Patient/caregiver understands and will follow disposition?: yes  Copied from CRM [id number]. Topic: Clinical - Red Word Triage >> Oct 06, 2023  7:46 AM Essie A wrote: Red Word that prompted transfer to Nurse Triage: Day 7 of cough, shortness of breath, chest congestion and nose, itchy ears, no fever (patient is diabetic)  Reason for Disposition  SEVERE coughing spells (e.g., whooping sound after coughing, vomiting after coughing)  [1] MILD difficulty breathing (e.g., minimal/no SOB at rest, SOB with walking, pulse <100) AND [2] NEW-onset or WORSE than normal    SOB w/exertion & after coughing spells  Answer Assessment - Initial Assessment Questions 1. RESPIRATORY STATUS: "Describe your breathing?" (e.g., wheezing, shortness of breath, unable to speak, severe coughing)      SOB w/erection &w/increase 2. ONSET: "When did this breathing problem begin?"      X 7 days 3. PATTERN "Does the difficult breathing come and go, or has it been constant since it started?"      Comes and goes 4. SEVERITY: "How bad is your breathing?" (e.g., mild, moderate, severe)    - MILD: No SOB at rest, mild SOB with walking, speaks normally in sentences, can lie down, no retractions, pulse < 100.    - MODERATE: SOB at rest, SOB with minimal exertion and prefers to sit, cannot lie down flat, speaks in phrases, mild retractions, audible wheezing, pulse 100-120.    - SEVERE: Very SOB at rest, speaks in single words, struggling to breathe, sitting  hunched forward, retractions, pulse > 120      mild 5. RECURRENT SYMPTOM: "Have you had difficulty breathing before?" If Yes, ask: "When was the last time?" and "What happened that time?"     yes 6. CARDIAC HISTORY: "Do you have any history of heart disease?" (e.g., heart attack, angina, bypass surgery, angioplasty)      N/a 7. LUNG HISTORY: "Do you have any history of lung disease?"  (e.g., pulmonary embolus, asthma, emphysema)     N/a 8. CAUSE: "What do you think is causing the breathing problem?"      unknown 9. OTHER SYMPTOMS: "Do you have any other symptoms? (e.g., dizziness, runny nose, cough, chest pain, fever)     Cough, bilateral ear itchy, runny nose, chest & nasal congestion 10. O2 SATURATION MONITOR:  "Do you use an oxygen saturation monitor (pulse oximeter) at home?" If Yes, ask: "What is your reading (oxygen level) today?" "What is your usual oxygen saturation reading?" (e.g., 95%)       96 11. PREGNANCY: "Is there any chance you are pregnant?" "When was your last menstrual period?"       N/a 12. TRAVEL: "Have you traveled out of the country in the last month?" (e.g., travel history, exposures)       N/a  Pt stated she gets SOB from her bedroom to the bathroom, no wheezing noted.  Offered patient in-office appt & patient asked to be scheduled a telehealth visit: telehealth visit scheduled for today.  Answer Assessment - Initial Assessment Questions 1. ONSET: "When did the cough  begin?"      X 7 days 2. SEVERITY: "How bad is the cough today?"      Moderate to severe - pt has coughing attacks/spells 3. SPUTUM: "Describe the color of your sputum" (none, dry cough; clear, white, yellow, green)     yellow 4. HEMOPTYSIS: "Are you coughing up any blood?" If so ask: "How much?" (flecks, streaks, tablespoons, etc.)     no 5. DIFFICULTY BREATHING: "Are you having difficulty breathing?" If Yes, ask: "How bad is it?" (e.g., mild, moderate, severe)    - MILD: No SOB at rest, mild SOB  with walking, speaks normally in sentences, can lie down, no retractions, pulse < 100.    - MODERATE: SOB at rest, SOB with minimal exertion and prefers to sit, cannot lie down flat, speaks in phrases, mild retractions, audible wheezing, pulse 100-120.    - SEVERE: Very SOB at rest, speaks in single words, struggling to breathe, sitting hunched forward, retractions, pulse > 120      moderate 6. FEVER: "Do you have a fever?" If Yes, ask: "What is your temperature, how was it measured, and when did it start?"     no 7. CARDIAC HISTORY: "Do you have any history of heart disease?" (e.g., heart attack, congestive heart failure)      N/a 8. LUNG HISTORY: "Do you have any history of lung disease?"  (e.g., pulmonary embolus, asthma, emphysema)     N/a 9. PE RISK FACTORS: "Do you have a history of blood clots?" (or: recent major surgery, recent prolonged travel, bedridden)     N/a 10. OTHER SYMPTOMS: "Do you have any other symptoms?" (e.g., runny nose, wheezing, chest pain)       Runny nose, chest and nasal congestion, bilateral ear itchy 11. PREGNANCY: "Is there any chance you are pregnant?" "When was your last menstrual period?"       N/a 12. TRAVEL: "Have you traveled out of the country in the last month?" (e.g., travel history, exposures)       N/a  Protocols used: Breathing Difficulty-A-AH, Cough - Acute Productive-A-AH

## 2023-10-08 ENCOUNTER — Ambulatory Visit: Payer: Self-pay

## 2023-10-08 ENCOUNTER — Ambulatory Visit (HOSPITAL_BASED_OUTPATIENT_CLINIC_OR_DEPARTMENT_OTHER)
Admission: RE | Admit: 2023-10-08 | Discharge: 2023-10-08 | Disposition: A | Discharge status: Home / Self Care | Source: Ambulatory Visit

## 2023-10-08 ENCOUNTER — Ambulatory Visit: Admitting: Family Medicine

## 2023-10-08 ENCOUNTER — Encounter: Payer: Self-pay | Admitting: Family Medicine

## 2023-10-08 ENCOUNTER — Other Ambulatory Visit (HOSPITAL_COMMUNITY): Payer: Self-pay

## 2023-10-08 VITALS — BP 102/70 | HR 74 | Temp 97.8°F | Resp 18 | Ht 62.0 in | Wt 198.6 lb

## 2023-10-08 DIAGNOSIS — R051 Acute cough: Secondary | ICD-10-CM

## 2023-10-08 DIAGNOSIS — J0100 Acute maxillary sinusitis, unspecified: Secondary | ICD-10-CM

## 2023-10-08 DIAGNOSIS — R059 Cough, unspecified: Secondary | ICD-10-CM | POA: Diagnosis not present

## 2023-10-08 DIAGNOSIS — R0602 Shortness of breath: Secondary | ICD-10-CM

## 2023-10-08 MED ORDER — AMOXICILLIN 875 MG PO TABS
875.0000 mg | ORAL_TABLET | Freq: Two times a day (BID) | ORAL | 0 refills | Status: AC
  Filled 2023-10-08: qty 14, 7d supply, fill #0

## 2023-10-08 NOTE — Assessment & Plan Note (Signed)
 Symptoms persisted for nine days. Initial steroid treatment ineffective. Chest x-ray negative for pneumonia. Antibiotics prescribed due to symptom duration. - Prescribe amoxicillin  875 mg twice daily for seven days. - Advise increased fluid intake. - Recommend probiotics during antibiotic therapy. - Suggest warm teas with honey or throat lozenges.

## 2023-10-08 NOTE — Telephone Encounter (Signed)
  FYI Only or Action Required?: FYI only for provider  Patient was last seen in primary care on 10/06/2023 by Sirivol, Mamatha, MD. Called Nurse Triage reporting URI. Symptoms began a week ago. Interventions attempted: Nothing. Symptoms are: unchanged.  Triage Disposition: See PCP When Office is Open (Within 3 Days)  Patient/caregiver understands and will follow disposition?: Yes   Reason for Disposition  [1] Sinus congestion (pressure, fullness) AND [2] present > 10 days  Answer Assessment - Initial Assessment Questions 1. ONSET: When did the nasal discharge start?      Declined triage.  Protocols used: Common Cold-A-AH

## 2023-10-08 NOTE — Progress Notes (Signed)
 Acute Office Visit  Subjective:    Patient ID: [name] Blanchard, female    DOB: 1991/01/29, 33 y.o.   MRN: [id number]  Chief Complaint  Patient presents with   Nasal Congestion   Cough   Ear Fullness   Ear Pain   Discussed the use of AI scribe software for clinical note transcription with the patient, who gave verbal consent to proceed.  History of Present Illness   [name] Blanchard is a 33 year old female with asthma who presents with cough, nasal congestion, and ear pain for nine days.  She has been experiencing cough, nasal congestion, ear fullness, and ear pain for the past nine days. Initially, the symptoms were mild and managed with over-the-counter medications and increased fluid intake. However, the symptoms worsened at work, leading to severe coughing that caused vomiting. She was prescribed steroids during a televisit due to a suspicion of bronchitis, a condition she frequently experiences. Despite the steroid treatment, her symptoms did not improve. A chest x-ray was performed, which showed no pneumonia.  She has been taking Zyrtec daily for allergies and was prescribed prednisone, which helped reduce the severity of her coughing attacks but raised her blood sugar levels. Due to concerns about her blood sugar, she reduced her prednisone dose to 20 mg today. She is not allergic to any antibiotics and has not had a yeast infection with antibiotic use in the past.  She has a history of asthma symptoms but never diagnosed. Symptoms typically worsening in fall and spring, leading to bronchitis and prolonged coughing. She was prescribed an albuterol inhaler, which did not provide relief and caused an increased heart rate. She experiences shortness of breath when walking around the house but does not have exercise-induced asthma or seasonal triggers. She is currently on phentermine , which causes dry mouth but does not affect her heart rate.  She uses a Libre device for  monitoring her blood sugar and recently had an A1c and foot exam with her endocrinologist. Her child, [name] Blanchard, started sneezing today but is otherwise healthy.   Past Medical History:  Diagnosis Date   Anxiety    diagnosed in 2012   Dysmenorrhea    Gestational diabetes    Pancreatitis    necrotizing pancreatitis w/ sepsis    Past Surgical History:  Procedure Laterality Date   ADENOIDECTOMY     33y/o   CESAREAN SECTION N/A 09/18/2021   Procedure: CESAREAN SECTION;  Surgeon: [name] Flores, MD;  Location: [facility];  Service: Obstetrics;  Laterality: N/A;   CHOLECYSTECTOMY     KNEE SURGERY Right 09/10/2010   Dr. Murrell Blanchard   TONSILLECTOMY     33 y/o   WISDOM TOOTH EXTRACTION     unsure if 2 or 4 teeth    Family History  Problem Relation Age of Onset   Hypertension Father    Diabetes Father    Hypertension Maternal Grandmother    Diabetes Maternal Grandmother    Hypertension Maternal Grandfather    Diabetes Maternal Grandfather    Hypertension Paternal Grandmother    Diabetes Paternal Grandmother    Hypertension Paternal Grandfather    Diabetes Paternal Grandfather     Social History   Socioeconomic History   Marital status: Single    Spouse name: Not on file   Number of children: Not on file   Years of education: Not on file   Highest education level: Not on file  Occupational History   Occupation: student  Tobacco Use  Smoking status: Never   Smokeless tobacco: Never  Substance and Sexual Activity   Alcohol use: Not Currently    Comment: Occasionally few mixed drinks with friends   Drug use: No   Sexual activity: Yes  Other Topics Concern   Not on file  Social History Narrative   Emergency department RN   Social Drivers of Health   Financial Resource Strain: Low Risk  (04/10/2023)   Overall Financial Resource Strain (CARDIA)    Difficulty of Paying Living Expenses: Not hard at all  Food Insecurity: No Food Insecurity (04/10/2023)   Hunger Vital Sign     Worried About Running Out of Food in the Last Year: Never true    Ran Out of Food in the Last Year: Never true  Transportation Needs: No Transportation Needs (04/10/2023)   PRAPARE - Administrator, Civil Service (Medical): No    Lack of Transportation (Non-Medical): No  Physical Activity: Not on file  Stress: No Stress Concern Present (04/10/2023)   [name] Blanchard [employer] - Occupational Stress Questionnaire    Feeling of Stress : Not at all  Social Connections: Unknown (02/12/2023)   Received [facility]   Social Network    Social Network: Not on file  Intimate Partner Violence: Not At Risk (04/10/2023)   Humiliation, Afraid, Rape, and Kick questionnaire    Fear of Current or Ex-Partner: No    Emotionally Abused: No    Physically Abused: No    Sexually Abused: No    Outpatient Medications Prior to Visit  Medication Sig Dispense Refill   benzonatate (TESSALON) 200 MG capsule Take 1 capsule (200 mg total) by mouth 3 (three) times daily as needed for up to 7 days. 21 capsule 0   cetirizine (ZYRTEC) 10 MG tablet Take 10 mg by mouth daily.     Continuous Glucose Sensor (FREESTYLE LIBRE 3 PLUS SENSOR) MISC Use 1 sensor every 15 days. 2 each 5   levothyroxine  (SYNTHROID ) 50 MCG tablet Take 1 tablet (50 mcg total) by mouth daily. 30 tablet 11   Multiple Vitamin (MULTIVITAMIN) capsule Take 1 capsule by mouth daily.     Phentermine  HCl (LOMAIRA ) 8 MG TABS Take 1 tablet (8 mg total) by mouth daily 30 minutes  before breakfast. 30 tablet 5   predniSONE (DELTASONE) 20 MG tablet Take 2 tablets (40 mg total) by mouth daily with breakfast for 5 days. (Patient taking differently: Take 20 mg by mouth daily with breakfast. Only takes half dose due to causing sugar spikes.) 10 tablet 0   Probiotic Product (PROBIOTIC ACIDOPHILUS) CHEW Chew 1 each by mouth daily.     Rimegepant Sulfate  (NURTEC) 75 MG TBDP Take 1 tablet (75 mg total) by mouth as needed (migraine). 30  tablet 2   No facility-administered medications prior to visit.    Allergies  Allergen Reactions   Morphine  Other (See Comments), Anaphylaxis and Shortness Of Breath    Respiratory rate dropped  Respiratory rate dropped    Respiratory rate dropped Respiratory rate dropped Respiratory rate dropped    Other reaction(s): Other (See Comments), Respiratory Distress (ALLERGY/intolerance) Respiratory rate dropped Respiratory rate dropped Respiratory rate dropped Respiratory rate dropped  Respiratory rate dropped, Respiratory rate dropped, Respiratory rate dropped   Lorazepam  Other (See Comments)    Hallucinations   Metoclopramide Other (See Comments)    Dystonic reaction. Combative disoriented   Prochlorperazine  Other (See Comments)    Dystonic reaction. Anxious, panicking, tearful.    Review of  Systems  Constitutional:  Positive for chills and fatigue. Negative for diaphoresis and fever.  HENT:  Positive for congestion, ear pain, rhinorrhea, sinus pressure and sinus pain. Negative for sore throat and trouble swallowing.   Eyes: Negative.   Respiratory:  Positive for cough and shortness of breath. Negative for wheezing.   Cardiovascular:  Negative for chest pain.  Gastrointestinal:  Negative for abdominal pain, constipation, diarrhea, nausea and vomiting.  Endocrine: Negative.   Genitourinary:  Negative for dysuria, frequency and urgency.  Musculoskeletal:  Negative for arthralgias.  Allergic/Immunologic: Negative.   Neurological:  Negative for weakness and headaches.  Hematological: Negative.   Psychiatric/Behavioral:  Negative for dysphoric mood. The patient is not nervous/anxious.        Objective:        10/08/2023    1:36 PM 10/06/2023    8:14 AM 09/16/2023   11:06 AM  Vitals with BMI  Height 5' 2 5' 2 5' 2  Weight 198 lbs 10 oz 195 lbs 195 lbs 13 oz  BMI 36.32 35.66 35.8  Systolic 102  102  Diastolic 70  60  Pulse 74 110 79    No data found.   Physical  Exam Vitals reviewed.  Constitutional:      General: She is not in acute distress.    Appearance: Normal appearance.  HENT:     Right Ear: Hearing, tympanic membrane, ear canal and external ear normal.     Left Ear: Hearing, tympanic membrane, ear canal and external ear normal.     Nose:     Right Sinus: Maxillary sinus tenderness present. No frontal sinus tenderness.     Left Sinus: Maxillary sinus tenderness present. No frontal sinus tenderness.     Mouth/Throat:     Pharynx: No posterior oropharyngeal erythema.     Tonsils: No tonsillar exudate. 1+ on the right. 1+ on the left.   Eyes:     Conjunctiva/sclera: Conjunctivae normal.    Cardiovascular:     Rate and Rhythm: Normal rate and regular rhythm.     Heart sounds: Normal heart sounds. No murmur heard. Pulmonary:     Effort: Pulmonary effort is normal.     Breath sounds: Normal breath sounds. No wheezing.  Abdominal:     General: Bowel sounds are normal.   Musculoskeletal:        General: Normal range of motion.     Cervical back: Normal range of motion.   Skin:    General: Skin is warm.   Neurological:     Mental Status: She is alert. Mental status is at baseline.   Psychiatric:        Mood and Affect: Mood normal.        Behavior: Behavior normal.     Health Maintenance Due  Topic Date Due   FOOT EXAM  Never done   OPHTHALMOLOGY EXAM  Never done   Diabetic kidney evaluation - Urine ACR  Never done   Pneumococcal Vaccine 26-21 Years old (1 of 2 - PCV) Never done   HEMOGLOBIN A1C  08/18/2016   COVID-19 Vaccine (4 - 2024-25 season) 12/28/2022    There are no preventive care reminders to display for this patient.   Lab Results  Component Value Date   TSH 3.340 08/13/2023   Lab Results  Component Value Date   WBC 9.1 03/11/2023   HGB 13.4 03/11/2023   HCT 41.6 03/11/2023   MCV 88 03/11/2023   PLT 339 03/11/2023   Lab Results  Component Value Date   NA 139 03/11/2023   K 4.5 03/11/2023   CO2  21 03/11/2023   GLUCOSE 115 (H) 03/11/2023   BUN 11 03/11/2023   CREATININE 0.87 03/11/2023   BILITOT 0.6 03/11/2023   ALKPHOS 55 03/11/2023   AST 20 03/11/2023   ALT 20 03/11/2023   PROT 7.5 03/11/2023   ALBUMIN 4.6 03/11/2023   CALCIUM 9.6 03/11/2023   ANIONGAP 10 07/28/2022   EGFR 91 03/11/2023   Lab Results  Component Value Date   CHOL 115 03/11/2023   Lab Results  Component Value Date   HDL 58 03/11/2023   Lab Results  Component Value Date   LDLCALC 46 03/11/2023   Lab Results  Component Value Date   TRIG 42 03/11/2023   Lab Results  Component Value Date   CHOLHDL 2.0 03/11/2023   Lab Results  Component Value Date   HGBA1C 5.5 02/18/2016       Assessment & Plan:  Acute non-recurrent maxillary sinusitis Assessment & Plan: Symptoms persisted for nine days. Initial steroid treatment ineffective. Chest x-ray negative for pneumonia. Antibiotics prescribed due to symptom duration. - Prescribe amoxicillin  875 mg twice daily for seven days. - Advise increased fluid intake. - Recommend probiotics during antibiotic therapy. - Suggest warm teas with honey or throat lozenges.  Orders: -     Amoxicillin ; Take 1 tablet (875 mg total) by mouth 2 (two) times daily for 7 days.  Dispense: 14 tablet; Refill: 0        Meds ordered this encounter  Medications   amoxicillin  (AMOXIL ) 875 MG tablet    Sig: Take 1 tablet (875 mg total) by mouth 2 (two) times daily for 7 days.    Dispense:  14 tablet    Refill:  0    No orders of the defined types were placed in this encounter.    Follow-up: Return in about 3 months (around 01/08/2024), or if symptoms worsen or fail to improve, for Annual Physical.  An After Visit Summary was printed and given to the patient.  Delford Felling, FNP [facility] [phone]

## 2023-10-09 ENCOUNTER — Other Ambulatory Visit (HOSPITAL_COMMUNITY): Payer: Self-pay

## 2023-10-15 ENCOUNTER — Other Ambulatory Visit: Payer: Self-pay

## 2023-10-15 ENCOUNTER — Other Ambulatory Visit: Payer: Self-pay | Admitting: Family Medicine

## 2023-10-15 ENCOUNTER — Encounter (HOSPITAL_BASED_OUTPATIENT_CLINIC_OR_DEPARTMENT_OTHER): Payer: Self-pay | Admitting: Emergency Medicine

## 2023-10-15 ENCOUNTER — Emergency Department (HOSPITAL_BASED_OUTPATIENT_CLINIC_OR_DEPARTMENT_OTHER)
Admission: EM | Admit: 2023-10-15 | Discharge: 2023-10-15 | Disposition: A | Discharge status: Home / Self Care | Attending: Emergency Medicine | Admitting: Emergency Medicine

## 2023-10-15 DIAGNOSIS — J069 Acute upper respiratory infection, unspecified: Secondary | ICD-10-CM | POA: Diagnosis not present

## 2023-10-15 DIAGNOSIS — R059 Cough, unspecified: Secondary | ICD-10-CM | POA: Diagnosis not present

## 2023-10-15 DIAGNOSIS — R0602 Shortness of breath: Secondary | ICD-10-CM

## 2023-10-15 MED ORDER — PREDNISONE 20 MG PO TABS: ORAL_TABLET | ORAL | 0 refills | Status: AC

## 2023-10-15 MED ORDER — DOXYCYCLINE HYCLATE 100 MG PO TABS
100.0000 mg | ORAL_TABLET | Freq: Once | ORAL | Status: AC
  Administered 2023-10-15: 100 mg via ORAL
  Filled 2023-10-15: qty 1

## 2023-10-15 MED ORDER — DOXYCYCLINE HYCLATE 100 MG PO CAPS: 100.0000 mg | ORAL_CAPSULE | Freq: Two times a day (BID) | ORAL | 0 refills | Status: AC

## 2023-10-15 MED ORDER — ALBUTEROL SULFATE HFA 108 (90 BASE) MCG/ACT IN AERS
2.0000 | INHALATION_SPRAY | Freq: Four times a day (QID) | RESPIRATORY_TRACT | 2 refills | Status: AC | PRN
Start: 2023-10-15 — End: ?

## 2023-10-15 MED ORDER — IPRATROPIUM-ALBUTEROL 0.5-2.5 (3) MG/3ML IN SOLN
3.0000 mL | RESPIRATORY_TRACT | Status: AC
  Administered 2023-10-15 (×2): 3 mL via RESPIRATORY_TRACT
  Filled 2023-10-15: qty 6

## 2023-10-15 MED ORDER — PREDNISONE 50 MG PO TABS
60.0000 mg | ORAL_TABLET | Freq: Once | ORAL | Status: AC
  Administered 2023-10-15: 60 mg via ORAL
  Filled 2023-10-15: qty 1

## 2023-10-15 MED ORDER — ALBUTEROL SULFATE HFA 108 (90 BASE) MCG/ACT IN AERS
2.0000 | INHALATION_SPRAY | Freq: Once | RESPIRATORY_TRACT | Status: AC
  Administered 2023-10-15: 2 via RESPIRATORY_TRACT
  Filled 2023-10-15: qty 6.7

## 2023-10-15 NOTE — ED Provider Notes (Signed)
 [place] [facility] AT [facility] Provider Note   CSN: [id number] Arrival date & time: 10/15/23  [id number]     Patient presents with: Cough   [name] Blanchard is a 33 y.o. female.   33 yo F with a chief complaint of cough and congestion.  This been going on for about 10 days now.  She has seen her family doctor and has been on a round of antibiotics and steroids.  She felt like her congestion had somewhat improved but started having worsening cough over the past day or so.  Tried an albuterol inhaler at home with some mild improvement.   Cough      Prior to Admission medications   Medication Sig Start Date End Date Taking? Authorizing Provider  doxycycline (VIBRAMYCIN) 100 MG capsule Take 1 capsule (100 mg total) by mouth 2 (two) times daily. One po bid x 7 days 10/15/23  Yes Carlis Blanchard, DO  predniSONE  (DELTASONE ) 20 MG tablet 2 tabs po daily x 4 days 10/15/23  Yes Albertus Hughs, DO  amoxicillin  (AMOXIL ) 875 MG tablet Take 1 tablet (875 mg total) by mouth 2 (two) times daily for 7 days. 10/08/23 10/15/23  Janece Means, FNP  cetirizine (ZYRTEC) 10 MG tablet Take 10 mg by mouth daily. 03/14/20   [provider]  Continuous Glucose Sensor (FREESTYLE LIBRE 3 PLUS SENSOR) MISC Use 1 sensor every 15 days. 02/23/23   Tasia Farr, MD  levothyroxine  (SYNTHROID ) 50 MCG tablet Take 1 tablet (50 mcg total) by mouth daily. 04/11/23   Janece Means, FNP  Multiple Vitamin (MULTIVITAMIN) capsule Take 1 capsule by mouth daily.    [provider]  Phentermine  HCl (LOMAIRA ) 8 MG TABS Take 1 tablet (8 mg total) by mouth daily 30 minutes  before breakfast. 09/24/23     Probiotic Product (PROBIOTIC ACIDOPHILUS) CHEW Chew 1 each by mouth daily.    [provider]  Rimegepant Sulfate  (NURTEC) 75 MG TBDP Take 1 tablet (75 mg total) by mouth as needed (migraine). 03/11/23   Janece Means, FNP  norgestimate -ethinyl estradiol  (SPRINTEC  28) 0.25-35 MG-MCG tablet  Take 1 tablet by mouth daily. 01/15/23 01/22/23      Allergies: Morphine , Lorazepam , Metoclopramide, and Prochlorperazine     Review of Systems  Respiratory:  Positive for cough.     Updated Vital Signs BP 126/79   Pulse 95   Temp 98.6 F (37 C) (Oral)   Resp 17   LMP 09/27/2023   SpO2 99%   Physical Exam Vitals and nursing note reviewed.  Constitutional:      General: She is not in acute distress.    Appearance: She is well-developed. She is not diaphoretic.  HENT:     Head: Normocephalic and atraumatic.     Comments: Swollen turbinates, posterior nasal drip  Eyes:     Pupils: Pupils are equal, round, and reactive to light.    Cardiovascular:     Rate and Rhythm: Normal rate and regular rhythm.     Heart sounds: No murmur heard.    No friction rub. No gallop.  Pulmonary:     Effort: Pulmonary effort is normal.     Breath sounds: No wheezing or rales.  Abdominal:     General: There is no distension.     Palpations: Abdomen is soft.     Tenderness: There is no abdominal tenderness.   Musculoskeletal:        General: No tenderness.     Cervical  back: Normal range of motion and neck supple.   Skin:    General: Skin is warm and dry.   Neurological:     Mental Status: She is alert and oriented to person, place, and time.   Psychiatric:        Behavior: Behavior normal.     (all labs ordered are listed, but only abnormal results are displayed) Labs Reviewed - No data to display  EKG: None  Radiology: No results found.   Procedures   Medications Ordered in the ED  albuterol (VENTOLIN HFA) 108 (90 Base) MCG/ACT inhaler 2 puff (has no administration in time range)  ipratropium-albuterol (DUONEB) 0.5-2.5 (3) MG/3ML nebulizer solution 3 mL (3 mLs Nebulization Given 10/15/23 0347)  predniSONE  (DELTASONE ) tablet 60 mg (60 mg Oral Given 10/15/23 0340)  doxycycline (VIBRA-TABS) tablet 100 mg (100 mg Oral Given 10/15/23 0341)                                     Medical Decision Making Risk Prescription drug management.   33 yo F with a chief complaints of cough congestion difficulty breathing chest tightness going on for about 10 days now.  She does have a bronchospastic cough on exam.  Will give 2 DuoNeb's here.  We started on steroids.  She was on a course of amoxicillin  and will trial doxycycline to cover atypicals.  PCP follow-up.  Patient feeling much better on repeat assessment.  Will discharge home.  4:07 AM:  I have discussed the diagnosis/risks/treatment options with the patient.  Evaluation and diagnostic testing in the emergency department does not suggest an emergent condition requiring admission or immediate intervention beyond what has been performed at this time.  They will follow up with PCP. We also discussed returning to the ED immediately if new or worsening sx occur. We discussed the sx which are most concerning (e.g., sudden worsening pain, fever, inability to tolerate by mouth, need to use inhaler more often than every 4 hours) that necessitate immediate return. Medications administered to the patient during their visit and any new prescriptions provided to the patient are listed below.  Medications given during this visit Medications  albuterol (VENTOLIN HFA) 108 (90 Base) MCG/ACT inhaler 2 puff (has no administration in time range)  ipratropium-albuterol (DUONEB) 0.5-2.5 (3) MG/3ML nebulizer solution 3 mL (3 mLs Nebulization Given 10/15/23 0347)  predniSONE  (DELTASONE ) tablet 60 mg (60 mg Oral Given 10/15/23 0340)  doxycycline (VIBRA-TABS) tablet 100 mg (100 mg Oral Given 10/15/23 0341)     The patient appears reasonably screen and/or stabilized for discharge and I doubt any other medical condition or other [facility] requiring further screening, evaluation, or treatment in the ED at this time prior to discharge.        Final diagnoses:  Viral URI with cough    ED Discharge Orders          Ordered    doxycycline (VIBRAMYCIN)  100 MG capsule  2 times daily        10/15/23 0406    predniSONE  (DELTASONE ) 20 MG tablet        10/15/23 0406               Albertus Hughs, DO 10/15/23 0407

## 2023-10-15 NOTE — Discharge Instructions (Signed)
 Max dosing of inhaler below.  Please follow up with your doc in the office.    Use your inhaler every 4 hours(4 puffs) while awake, return for sudden worsening shortness of breath, or if you need to use your inhaler more often.

## 2023-10-15 NOTE — ED Triage Notes (Signed)
 Pt reports cough x 2 weeks. Has been taking prednisone  & abx w/ no relief. Reports her chest & throat feels tight.

## 2023-10-28 ENCOUNTER — Other Ambulatory Visit: Payer: Self-pay

## 2023-10-28 ENCOUNTER — Other Ambulatory Visit (HOSPITAL_COMMUNITY): Payer: Self-pay

## 2023-11-05 DIAGNOSIS — F411 Generalized anxiety disorder: Secondary | ICD-10-CM | POA: Diagnosis not present

## 2023-11-05 DIAGNOSIS — F900 Attention-deficit hyperactivity disorder, predominantly inattentive type: Secondary | ICD-10-CM | POA: Diagnosis not present

## 2023-11-05 DIAGNOSIS — F4312 Post-traumatic stress disorder, chronic: Secondary | ICD-10-CM | POA: Diagnosis not present

## 2023-11-19 DIAGNOSIS — F411 Generalized anxiety disorder: Secondary | ICD-10-CM | POA: Diagnosis not present

## 2023-11-19 DIAGNOSIS — F4312 Post-traumatic stress disorder, chronic: Secondary | ICD-10-CM | POA: Diagnosis not present

## 2023-11-19 DIAGNOSIS — F900 Attention-deficit hyperactivity disorder, predominantly inattentive type: Secondary | ICD-10-CM | POA: Diagnosis not present

## 2023-11-30 ENCOUNTER — Other Ambulatory Visit (HOSPITAL_COMMUNITY): Payer: Self-pay

## 2023-12-03 ENCOUNTER — Other Ambulatory Visit (HOSPITAL_COMMUNITY): Payer: Self-pay

## 2023-12-03 ENCOUNTER — Other Ambulatory Visit: Payer: Self-pay

## 2023-12-03 DIAGNOSIS — F4312 Post-traumatic stress disorder, chronic: Secondary | ICD-10-CM | POA: Diagnosis not present

## 2023-12-03 DIAGNOSIS — F900 Attention-deficit hyperactivity disorder, predominantly inattentive type: Secondary | ICD-10-CM | POA: Diagnosis not present

## 2023-12-03 DIAGNOSIS — F411 Generalized anxiety disorder: Secondary | ICD-10-CM | POA: Diagnosis not present

## 2023-12-10 DIAGNOSIS — F900 Attention-deficit hyperactivity disorder, predominantly inattentive type: Secondary | ICD-10-CM | POA: Diagnosis not present

## 2023-12-10 DIAGNOSIS — F4312 Post-traumatic stress disorder, chronic: Secondary | ICD-10-CM | POA: Diagnosis not present

## 2023-12-10 DIAGNOSIS — F411 Generalized anxiety disorder: Secondary | ICD-10-CM | POA: Diagnosis not present

## 2023-12-23 ENCOUNTER — Other Ambulatory Visit (HOSPITAL_COMMUNITY): Payer: Self-pay

## 2023-12-24 DIAGNOSIS — F411 Generalized anxiety disorder: Secondary | ICD-10-CM | POA: Diagnosis not present

## 2023-12-24 DIAGNOSIS — F4312 Post-traumatic stress disorder, chronic: Secondary | ICD-10-CM | POA: Diagnosis not present

## 2023-12-24 DIAGNOSIS — F900 Attention-deficit hyperactivity disorder, predominantly inattentive type: Secondary | ICD-10-CM | POA: Diagnosis not present

## 2024-01-08 ENCOUNTER — Other Ambulatory Visit (HOSPITAL_COMMUNITY): Payer: Self-pay

## 2024-01-08 ENCOUNTER — Other Ambulatory Visit: Payer: Self-pay

## 2024-01-11 ENCOUNTER — Other Ambulatory Visit (HOSPITAL_COMMUNITY): Payer: Self-pay

## 2024-01-13 ENCOUNTER — Telehealth: Payer: Self-pay

## 2024-01-13 NOTE — Telephone Encounter (Signed)
 PA submitted and approved via covermymeds for Nurtec. [id number]

## 2024-01-14 DIAGNOSIS — F900 Attention-deficit hyperactivity disorder, predominantly inattentive type: Secondary | ICD-10-CM | POA: Diagnosis not present

## 2024-01-14 DIAGNOSIS — F411 Generalized anxiety disorder: Secondary | ICD-10-CM | POA: Diagnosis not present

## 2024-01-14 DIAGNOSIS — F4312 Post-traumatic stress disorder, chronic: Secondary | ICD-10-CM | POA: Diagnosis not present

## 2024-01-15 DIAGNOSIS — Z1151 Encounter for screening for human papillomavirus (HPV): Secondary | ICD-10-CM | POA: Diagnosis not present

## 2024-01-15 DIAGNOSIS — Z6836 Body mass index (BMI) 36.0-36.9, adult: Secondary | ICD-10-CM | POA: Diagnosis not present

## 2024-01-15 DIAGNOSIS — Z124 Encounter for screening for malignant neoplasm of cervix: Secondary | ICD-10-CM | POA: Diagnosis not present

## 2024-01-15 DIAGNOSIS — Z01419 Encounter for gynecological examination (general) (routine) without abnormal findings: Secondary | ICD-10-CM | POA: Diagnosis not present

## 2024-01-22 ENCOUNTER — Other Ambulatory Visit (HOSPITAL_COMMUNITY): Payer: Self-pay

## 2024-01-22 ENCOUNTER — Other Ambulatory Visit: Payer: Self-pay

## 2024-01-25 ENCOUNTER — Other Ambulatory Visit: Payer: Self-pay

## 2024-01-25 ENCOUNTER — Other Ambulatory Visit (HOSPITAL_COMMUNITY): Payer: Self-pay

## 2024-01-25 ENCOUNTER — Encounter: Payer: Self-pay | Admitting: Pharmacist

## 2024-01-25 MED ORDER — FREESTYLE LIBRE 3 PLUS SENSOR MISC
3 refills | Status: AC
  Filled 2024-01-25: qty 2, 30d supply, fill #0
  Filled 2024-02-21: qty 2, 30d supply, fill #1
  Filled 2024-03-20: qty 2, 30d supply, fill #2
  Filled 2024-04-23: qty 2, 30d supply, fill #3

## 2024-01-26 ENCOUNTER — Other Ambulatory Visit: Payer: Self-pay

## 2024-01-26 ENCOUNTER — Other Ambulatory Visit (HOSPITAL_COMMUNITY): Payer: Self-pay

## 2024-01-27 ENCOUNTER — Other Ambulatory Visit (HOSPITAL_COMMUNITY): Payer: Self-pay

## 2024-01-27 DIAGNOSIS — F900 Attention-deficit hyperactivity disorder, predominantly inattentive type: Secondary | ICD-10-CM | POA: Diagnosis not present

## 2024-01-27 DIAGNOSIS — F4312 Post-traumatic stress disorder, chronic: Secondary | ICD-10-CM | POA: Diagnosis not present

## 2024-01-27 DIAGNOSIS — F411 Generalized anxiety disorder: Secondary | ICD-10-CM | POA: Diagnosis not present

## 2024-02-11 DIAGNOSIS — F900 Attention-deficit hyperactivity disorder, predominantly inattentive type: Secondary | ICD-10-CM | POA: Diagnosis not present

## 2024-02-11 DIAGNOSIS — F4312 Post-traumatic stress disorder, chronic: Secondary | ICD-10-CM | POA: Diagnosis not present

## 2024-02-11 DIAGNOSIS — F411 Generalized anxiety disorder: Secondary | ICD-10-CM | POA: Diagnosis not present

## 2024-02-22 ENCOUNTER — Other Ambulatory Visit (HOSPITAL_COMMUNITY): Payer: Self-pay

## 2024-02-22 ENCOUNTER — Other Ambulatory Visit: Payer: Self-pay

## 2024-02-25 DIAGNOSIS — F900 Attention-deficit hyperactivity disorder, predominantly inattentive type: Secondary | ICD-10-CM | POA: Diagnosis not present

## 2024-02-25 DIAGNOSIS — F4312 Post-traumatic stress disorder, chronic: Secondary | ICD-10-CM | POA: Diagnosis not present

## 2024-02-25 DIAGNOSIS — F411 Generalized anxiety disorder: Secondary | ICD-10-CM | POA: Diagnosis not present

## 2024-03-10 DIAGNOSIS — F900 Attention-deficit hyperactivity disorder, predominantly inattentive type: Secondary | ICD-10-CM | POA: Diagnosis not present

## 2024-03-10 DIAGNOSIS — F411 Generalized anxiety disorder: Secondary | ICD-10-CM | POA: Diagnosis not present

## 2024-03-10 DIAGNOSIS — F4312 Post-traumatic stress disorder, chronic: Secondary | ICD-10-CM | POA: Diagnosis not present

## 2024-03-21 ENCOUNTER — Other Ambulatory Visit (HOSPITAL_COMMUNITY): Payer: Self-pay

## 2024-03-21 DIAGNOSIS — E039 Hypothyroidism, unspecified: Secondary | ICD-10-CM | POA: Diagnosis not present

## 2024-03-21 DIAGNOSIS — O24419 Gestational diabetes mellitus in pregnancy, unspecified control: Secondary | ICD-10-CM | POA: Diagnosis not present

## 2024-03-28 ENCOUNTER — Other Ambulatory Visit (HOSPITAL_COMMUNITY): Payer: Self-pay

## 2024-03-28 DIAGNOSIS — K859 Acute pancreatitis without necrosis or infection, unspecified: Secondary | ICD-10-CM | POA: Diagnosis not present

## 2024-03-28 DIAGNOSIS — N926 Irregular menstruation, unspecified: Secondary | ICD-10-CM | POA: Diagnosis not present

## 2024-03-28 DIAGNOSIS — E1165 Type 2 diabetes mellitus with hyperglycemia: Secondary | ICD-10-CM | POA: Diagnosis not present

## 2024-03-28 DIAGNOSIS — Z794 Long term (current) use of insulin: Secondary | ICD-10-CM | POA: Diagnosis not present

## 2024-03-29 ENCOUNTER — Other Ambulatory Visit (HOSPITAL_COMMUNITY): Payer: Self-pay

## 2024-03-30 ENCOUNTER — Other Ambulatory Visit (HOSPITAL_COMMUNITY): Payer: Self-pay

## 2024-03-30 MED ORDER — SYNTHROID 50 MCG PO TABS
ORAL_TABLET | ORAL | 5 refills | Status: AC
  Filled 2024-03-30 – 2024-04-29 (×3): qty 34, 30d supply, fill #0

## 2024-03-31 ENCOUNTER — Other Ambulatory Visit (HOSPITAL_COMMUNITY): Payer: Self-pay

## 2024-03-31 DIAGNOSIS — F900 Attention-deficit hyperactivity disorder, predominantly inattentive type: Secondary | ICD-10-CM | POA: Diagnosis not present

## 2024-03-31 DIAGNOSIS — F4312 Post-traumatic stress disorder, chronic: Secondary | ICD-10-CM | POA: Diagnosis not present

## 2024-03-31 DIAGNOSIS — F411 Generalized anxiety disorder: Secondary | ICD-10-CM | POA: Diagnosis not present

## 2024-04-04 DIAGNOSIS — L249 Irritant contact dermatitis, unspecified cause: Secondary | ICD-10-CM | POA: Diagnosis not present

## 2024-04-06 ENCOUNTER — Other Ambulatory Visit (HOSPITAL_COMMUNITY): Payer: Self-pay

## 2024-04-06 ENCOUNTER — Other Ambulatory Visit: Payer: Self-pay

## 2024-04-07 DIAGNOSIS — F4312 Post-traumatic stress disorder, chronic: Secondary | ICD-10-CM | POA: Diagnosis not present

## 2024-04-07 DIAGNOSIS — F900 Attention-deficit hyperactivity disorder, predominantly inattentive type: Secondary | ICD-10-CM | POA: Diagnosis not present

## 2024-04-07 DIAGNOSIS — F411 Generalized anxiety disorder: Secondary | ICD-10-CM | POA: Diagnosis not present

## 2024-04-23 ENCOUNTER — Other Ambulatory Visit: Payer: Self-pay | Admitting: Family Medicine

## 2024-04-23 ENCOUNTER — Other Ambulatory Visit (HOSPITAL_COMMUNITY): Payer: Self-pay

## 2024-04-24 ENCOUNTER — Other Ambulatory Visit: Payer: Self-pay | Admitting: Family Medicine

## 2024-04-25 ENCOUNTER — Other Ambulatory Visit (HOSPITAL_COMMUNITY): Payer: Self-pay

## 2024-04-25 ENCOUNTER — Other Ambulatory Visit: Payer: Self-pay

## 2024-04-25 MED ORDER — PHENTERMINE HCL 8 MG PO TABS
ORAL_TABLET | ORAL | 5 refills | Status: AC
  Filled 2024-04-25: qty 30, 30d supply, fill #0

## 2024-04-27 ENCOUNTER — Other Ambulatory Visit: Payer: Self-pay

## 2024-04-29 ENCOUNTER — Other Ambulatory Visit: Payer: Self-pay | Admitting: Family Medicine

## 2024-04-29 ENCOUNTER — Other Ambulatory Visit (HOSPITAL_COMMUNITY): Payer: Self-pay

## 2024-04-29 ENCOUNTER — Other Ambulatory Visit: Payer: Self-pay

## 2024-04-29 MED ORDER — LEVOTHYROXINE SODIUM 50 MCG PO TABS
50.0000 ug | ORAL_TABLET | Freq: Every day | ORAL | 0 refills | Status: AC
  Filled 2024-04-29: qty 90, 90d supply, fill #0

## 2024-05-12 ENCOUNTER — Other Ambulatory Visit: Payer: Self-pay | Admitting: Family Medicine

## 2024-05-12 DIAGNOSIS — M25551 Pain in right hip: Secondary | ICD-10-CM

## 2024-06-21 ENCOUNTER — Ambulatory Visit: Admitting: Family Medicine
